# Patient Record
Sex: Male | Born: 1940 | Race: White | Hispanic: No | Marital: Married | Smoking: Former smoker
Health system: Southern US, Community
[De-identification: ages and names within clinical notes are randomized; demographics above are authoritative.]

## PROBLEM LIST (undated history)

## (undated) DIAGNOSIS — I10 Essential (primary) hypertension: Secondary | ICD-10-CM

## (undated) DIAGNOSIS — N289 Disorder of kidney and ureter, unspecified: Secondary | ICD-10-CM

## (undated) DIAGNOSIS — A048 Other specified bacterial intestinal infections: Secondary | ICD-10-CM

## (undated) DIAGNOSIS — E785 Hyperlipidemia, unspecified: Secondary | ICD-10-CM

## (undated) DIAGNOSIS — N400 Enlarged prostate without lower urinary tract symptoms: Secondary | ICD-10-CM

## (undated) DIAGNOSIS — M109 Gout, unspecified: Secondary | ICD-10-CM

## (undated) DIAGNOSIS — E559 Vitamin D deficiency, unspecified: Secondary | ICD-10-CM

## (undated) DIAGNOSIS — L409 Psoriasis, unspecified: Secondary | ICD-10-CM

## (undated) DIAGNOSIS — G4730 Sleep apnea, unspecified: Secondary | ICD-10-CM

## (undated) DIAGNOSIS — M1990 Unspecified osteoarthritis, unspecified site: Secondary | ICD-10-CM

## (undated) DIAGNOSIS — N189 Chronic kidney disease, unspecified: Secondary | ICD-10-CM

## (undated) HISTORY — PX: KNEE ARTHROSCOPY: SUR90

## (undated) HISTORY — PX: REPLACEMENT TOTAL KNEE: SUR1224

## (undated) HISTORY — PX: TRANSURETHRAL RESECTION OF PROSTATE: SHX73

## (undated) HISTORY — PX: JOINT REPLACEMENT: SHX530

## (undated) HISTORY — PX: SHOULDER SURGERY: SHX246

---

## 2004-06-18 ENCOUNTER — Ambulatory Visit: Payer: Self-pay | Admitting: Orthopaedic Surgery

## 2005-12-28 ENCOUNTER — Emergency Department: Payer: Self-pay | Admitting: Emergency Medicine

## 2006-03-03 ENCOUNTER — Ambulatory Visit: Payer: Self-pay | Admitting: Gastroenterology

## 2008-06-29 ENCOUNTER — Ambulatory Visit: Payer: Self-pay | Admitting: Otolaryngology

## 2009-05-24 ENCOUNTER — Ambulatory Visit: Payer: Self-pay | Admitting: Otolaryngology

## 2009-06-07 ENCOUNTER — Ambulatory Visit: Payer: Self-pay | Admitting: Otolaryngology

## 2010-07-23 ENCOUNTER — Ambulatory Visit: Payer: Self-pay | Admitting: Nephrology

## 2010-09-17 ENCOUNTER — Ambulatory Visit: Payer: Self-pay | Admitting: Unknown Physician Specialty

## 2010-09-27 ENCOUNTER — Ambulatory Visit: Payer: Self-pay | Admitting: Anesthesiology

## 2010-10-01 ENCOUNTER — Ambulatory Visit: Payer: Self-pay | Admitting: Unknown Physician Specialty

## 2012-01-14 ENCOUNTER — Ambulatory Visit: Payer: Self-pay | Admitting: Gastroenterology

## 2012-08-04 ENCOUNTER — Emergency Department: Payer: Self-pay | Admitting: Emergency Medicine

## 2012-08-04 LAB — COMPREHENSIVE METABOLIC PANEL
Alkaline Phosphatase: 107 U/L (ref 50–136)
Anion Gap: 7 (ref 7–16)
BUN: 23 mg/dL — ABNORMAL HIGH (ref 7–18)
Bilirubin,Total: 0.5 mg/dL (ref 0.2–1.0)
Co2: 27 mmol/L (ref 21–32)
Creatinine: 1.91 mg/dL — ABNORMAL HIGH (ref 0.60–1.30)
EGFR (African American): 39 — ABNORMAL LOW
EGFR (Non-African Amer.): 34 — ABNORMAL LOW
Glucose: 149 mg/dL — ABNORMAL HIGH (ref 65–99)
Osmolality: 282 (ref 275–301)
Potassium: 3.7 mmol/L (ref 3.5–5.1)
SGPT (ALT): 20 U/L (ref 12–78)
Sodium: 138 mmol/L (ref 136–145)

## 2012-08-04 LAB — URINALYSIS, COMPLETE
Bacteria: NONE SEEN
Hyaline Cast: 1
Ketone: NEGATIVE
Nitrite: NEGATIVE
Ph: 5 (ref 4.5–8.0)
Protein: NEGATIVE
Squamous Epithelial: 1

## 2012-08-04 LAB — CBC
HCT: 44.1 % (ref 40.0–52.0)
HGB: 15.1 g/dL (ref 13.0–18.0)
MCH: 28 pg (ref 26.0–34.0)
MCHC: 34.1 g/dL (ref 32.0–36.0)
Platelet: 301 10*3/uL (ref 150–440)
RBC: 5.39 10*6/uL (ref 4.40–5.90)
WBC: 13.8 10*3/uL — ABNORMAL HIGH (ref 3.8–10.6)

## 2013-05-20 ENCOUNTER — Ambulatory Visit: Payer: Self-pay | Admitting: Unknown Physician Specialty

## 2014-08-10 ENCOUNTER — Ambulatory Visit: Payer: Self-pay | Admitting: Unknown Physician Specialty

## 2014-08-24 ENCOUNTER — Ambulatory Visit: Payer: Self-pay | Admitting: Unknown Physician Specialty

## 2014-08-24 LAB — PROTIME-INR
INR: 0.9
Prothrombin Time: 12.3 secs (ref 11.5–14.7)

## 2014-08-24 LAB — BASIC METABOLIC PANEL
Anion Gap: 7 (ref 7–16)
BUN: 20 mg/dL — ABNORMAL HIGH (ref 7–18)
CHLORIDE: 104 mmol/L (ref 98–107)
CO2: 28 mmol/L (ref 21–32)
Calcium, Total: 9.2 mg/dL (ref 8.5–10.1)
Creatinine: 1.51 mg/dL — ABNORMAL HIGH (ref 0.60–1.30)
GFR CALC AF AMER: 58 — AB
GFR CALC NON AF AMER: 48 — AB
Glucose: 87 mg/dL (ref 65–99)
Osmolality: 280 (ref 275–301)
Potassium: 4.1 mmol/L (ref 3.5–5.1)
Sodium: 139 mmol/L (ref 136–145)

## 2014-08-24 LAB — MRSA PCR SCREENING

## 2014-08-24 LAB — CBC
HCT: 44.3 % (ref 40.0–52.0)
HGB: 14.4 g/dL (ref 13.0–18.0)
MCH: 27.3 pg (ref 26.0–34.0)
MCHC: 32.6 g/dL (ref 32.0–36.0)
MCV: 84 fL (ref 80–100)
PLATELETS: 310 10*3/uL (ref 150–440)
RBC: 5.3 10*6/uL (ref 4.40–5.90)
RDW: 14.1 % (ref 11.5–14.5)
WBC: 10.1 10*3/uL (ref 3.8–10.6)

## 2014-08-24 LAB — APTT: ACTIVATED PTT: 27 s (ref 23.6–35.9)

## 2014-09-07 ENCOUNTER — Inpatient Hospital Stay: Payer: Self-pay | Admitting: Unknown Physician Specialty

## 2014-09-08 LAB — CBC WITH DIFFERENTIAL/PLATELET
Basophil #: 0 10*3/uL (ref 0.0–0.1)
Basophil %: 0.2 %
EOS PCT: 0.1 %
Eosinophil #: 0 10*3/uL (ref 0.0–0.7)
HCT: 36.4 % — ABNORMAL LOW (ref 40.0–52.0)
HGB: 12 g/dL — ABNORMAL LOW (ref 13.0–18.0)
LYMPHS ABS: 1.4 10*3/uL (ref 1.0–3.6)
Lymphocyte %: 8.7 %
MCH: 27.3 pg (ref 26.0–34.0)
MCHC: 32.9 g/dL (ref 32.0–36.0)
MCV: 83 fL (ref 80–100)
Monocyte #: 2 x10 3/mm — ABNORMAL HIGH (ref 0.2–1.0)
Monocyte %: 12.2 %
NEUTROS ABS: 12.8 10*3/uL — AB (ref 1.4–6.5)
Neutrophil %: 78.8 %
Platelet: 263 10*3/uL (ref 150–440)
RBC: 4.39 10*6/uL — AB (ref 4.40–5.90)
RDW: 14.3 % (ref 11.5–14.5)
WBC: 16.3 10*3/uL — AB (ref 3.8–10.6)

## 2014-09-08 LAB — BASIC METABOLIC PANEL
Anion Gap: 6 — ABNORMAL LOW (ref 7–16)
BUN: 19 mg/dL — AB (ref 7–18)
CALCIUM: 8.3 mg/dL — AB (ref 8.5–10.1)
Chloride: 104 mmol/L (ref 98–107)
Co2: 30 mmol/L (ref 21–32)
Creatinine: 1.65 mg/dL — ABNORMAL HIGH (ref 0.60–1.30)
EGFR (African American): 53 — ABNORMAL LOW
EGFR (Non-African Amer.): 43 — ABNORMAL LOW
GLUCOSE: 153 mg/dL — AB (ref 65–99)
Osmolality: 285 (ref 275–301)
Potassium: 4.5 mmol/L (ref 3.5–5.1)
SODIUM: 140 mmol/L (ref 136–145)

## 2014-09-09 LAB — BASIC METABOLIC PANEL
Anion Gap: 6 — ABNORMAL LOW (ref 7–16)
BUN: 17 mg/dL (ref 7–18)
CHLORIDE: 100 mmol/L (ref 98–107)
CREATININE: 1.59 mg/dL — AB (ref 0.60–1.30)
Calcium, Total: 8.2 mg/dL — ABNORMAL LOW (ref 8.5–10.1)
Co2: 28 mmol/L (ref 21–32)
EGFR (African American): 55 — ABNORMAL LOW
GFR CALC NON AF AMER: 45 — AB
GLUCOSE: 163 mg/dL — AB (ref 65–99)
Osmolality: 273 (ref 275–301)
POTASSIUM: 4.3 mmol/L (ref 3.5–5.1)
SODIUM: 134 mmol/L — AB (ref 136–145)

## 2014-09-09 LAB — CBC WITH DIFFERENTIAL/PLATELET
BASOS PCT: 0.4 %
Basophil #: 0.1 10*3/uL (ref 0.0–0.1)
EOS PCT: 0.7 %
Eosinophil #: 0.1 10*3/uL (ref 0.0–0.7)
HCT: 32.9 % — AB (ref 40.0–52.0)
HGB: 10.7 g/dL — AB (ref 13.0–18.0)
LYMPHS PCT: 7.3 %
Lymphocyte #: 1.3 10*3/uL (ref 1.0–3.6)
MCH: 27.2 pg (ref 26.0–34.0)
MCHC: 32.6 g/dL (ref 32.0–36.0)
MCV: 84 fL (ref 80–100)
Monocyte #: 2 x10 3/mm — ABNORMAL HIGH (ref 0.2–1.0)
Monocyte %: 11.7 %
Neutrophil #: 13.8 10*3/uL — ABNORMAL HIGH (ref 1.4–6.5)
Neutrophil %: 79.9 %
Platelet: 226 10*3/uL (ref 150–440)
RBC: 3.93 10*6/uL — ABNORMAL LOW (ref 4.40–5.90)
RDW: 14.2 % (ref 11.5–14.5)
WBC: 17.3 10*3/uL — AB (ref 3.8–10.6)

## 2014-09-11 LAB — CBC WITH DIFFERENTIAL/PLATELET
BASOS ABS: 0 10*3/uL (ref 0.0–0.1)
BASOS PCT: 0.3 %
EOS ABS: 0.4 10*3/uL (ref 0.0–0.7)
Eosinophil %: 3.7 %
HCT: 29.1 % — ABNORMAL LOW (ref 40.0–52.0)
HGB: 9.6 g/dL — AB (ref 13.0–18.0)
Lymphocyte #: 1.1 10*3/uL (ref 1.0–3.6)
Lymphocyte %: 9.6 %
MCH: 27.4 pg (ref 26.0–34.0)
MCHC: 32.9 g/dL (ref 32.0–36.0)
MCV: 83 fL (ref 80–100)
MONO ABS: 1.1 x10 3/mm — AB (ref 0.2–1.0)
Monocyte %: 10.1 %
NEUTROS ABS: 8.4 10*3/uL — AB (ref 1.4–6.5)
NEUTROS PCT: 76.3 %
Platelet: 260 10*3/uL (ref 150–440)
RBC: 3.49 10*6/uL — ABNORMAL LOW (ref 4.40–5.90)
RDW: 14 % (ref 11.5–14.5)
WBC: 11.1 10*3/uL — ABNORMAL HIGH (ref 3.8–10.6)

## 2014-09-12 ENCOUNTER — Ambulatory Visit: Payer: Self-pay

## 2014-12-07 ENCOUNTER — Ambulatory Visit
Admit: 2014-12-07 | Disposition: A | Discharge status: Home / Self Care | Payer: Self-pay | Attending: Unknown Physician Specialty | Admitting: Unknown Physician Specialty

## 2015-01-04 NOTE — Op Note (Signed)
PATIENT NAME:  Corey Johnson, Corey Johnson MR#:  [id number] DATE OF BIRTH:  1940-11-01  DATE OF PROCEDURE:  09/07/2014  PREOPERATIVE DIAGNOSIS: Severe degenerative arthritis, left knee.   POSTOPERATIVE DIAGNOSIS: Severe degenerative arthritis, left knee.  OPERATION: [employer] total knee replacement.   SURGEON: Jones Bales., MD   ANESTHESIA: Spinal.   HISTORY: The patient had a long history of bilateral knee degenerative arthritis. His left knee had been more symptomatic. He had had MRIs that confirmed that he had severe tricompartmental disease. The patient was ultimately brought in for total knee replacement due to his persistent worsening symptoms.   DESCRIPTION OF PROCEDURE: The patient was taken the Operating Room where satisfactory spinal anesthesia was achieved. A Foley catheter was inserted. A tourniquet was applied to the left thigh, and the left lower extremity was prepped and draped in the usual fashion for a procedure about the knee. The patient was given IV Kefzol prior to the start of the procedure. The left lower extremity was exsanguinated and the tourniquet was inflated. I went ahead and made a longitudinal incision over the anterior aspect of the knee. Incision was carried down to the extensor mechanism. The extensor mechanism  was divided  proximally along the medial third of the quadriceps mechanism, and then distally along the medial border of the patellar tendon.   The patella was everted and dislocated laterally. I did go ahead and resect about 8 mm of bone from the retropatellar surface. I removed osteophytes from the peripheral femur and intercondylar notch. The patient had severe degenerative arthritis involving all 3 compartments.   The meniscal remnants were excised. A hole was made in the trochlear groove near the femoral origin of the PCL. An intramedullary rod was inserted. On the distal end of the rod was an alignment jig and the distal femoral cutting block. After  the distal femoral cutting block was positioned, it was fixed to the distal femur with pins and then 10 mm of bone was resected from the distal femur. We then impacted a sizer onto the distal femur. It was oriented in 3 degrees of external rotation. Using this sizeer it was determined that a #6 femoral component was going to be appropriate for the femur. We then impacted the #6 femoral cutting block onto the distal femur and made our anterior and posterior cuts along with our anterior and posterior chamfer cuts. Using the appropriate jig, I notched out the intercondylar area. The cruciates were excised. The knee was hyperflexed. I went ahead and used an external alignment jig to position the proximal tibial cutting block. We set the cutting block for a cut 9 mm below the lateral tibial plateau. The block was fixed to the proximal tibia and then the alignment jig was removed. The proximal tibial cut was made without difficulty.   Next, we inserted the trial components. It was difficult getting the tibial component in, so we went ahead and removed 2 mm more of bone from the proximal tibia. After this cut, I was able to impact the #6 femoral trial along the distal femur and used a #5 tibial baseplate with a 9 mm spacer. The knee seemed to come to full extension. Holes were made in the distal femur for the pegs on the femoral component.   I then made holes in the retropatellar surface for the retropatellar component. The trials were removed. The knee was hyperflexed. Jet lavage was used to remove blood from the cut cancellous bone. I then sequentially  impacted the components. Prior to impaction, I did make a cruciate cut in the proximal tibia.   I then sequentially impacted the components: The #5 tibial baseplate was impacted onto the proximal tibia with methylmethacrylate. The #6 PS femoral component was impacted onto the distal femur with methylmethacrylate. Excess glue was removed from the components. The trial  #5, 9 mm thick, tibial bearing insert was placed on the tibial tray and the knee was extended, and then I cemented the 35 mm diameter x 10 mm thick asymmetric patella to the retropatellar surface.   At this point the patella tracked well. The knee came to virtual full extension and seemed to be stable. I then removed the trial tibial bearing insert. The permanent #5, 9 mm thick tibial bearing insert was impacted onto the tibial tray, and then the knee was extended.   After glue had set up, the tourniquet was released. It was up for 123 minutes. Bleeding was controlled with coagulation cautery. The wound was irrigated. I then injected 60 mL of a mixture of Exparel and saline into the wound, and then injected about 30 mL of 0.25% Marcaine with epinephrine into the wound. The capsule was closed with 0 Ethibond sutures and the subcutaneous with 2-0 Vicryl and the skin with skin staples.   Betadine was applied to the wounds. Four TENS pads were applied about the knee. Sterile dressing was applied, and a Polar Care was applied over this.   The patient was then transferred to a stretcher bed and taken to the recovery room in satisfactory condition.   Blood loss was about maybe 100 mL.    ____________________________ Kathrene Alu., MD hbk:MT D: 09/07/2014 11:38:36 ET T: 09/07/2014 14:53:41 ET JOB#: [id number]  cc: Kathrene Alu., MD, <Dictator> Vilinda Flake, Brooke Bonito MD ELECTRONICALLY SIGNED 09/28/2014 19:05

## 2015-01-08 NOTE — Discharge Summary (Signed)
PATIENT NAME:  Corey Johnson, Corey MR#:  Johnson DATE OF BIRTH:  09-Apr-1941  DATE OF ADMISSION:  09/07/2014 DATE OF DISCHARGE:  09/11/2014  ADMITTING DIAGNOSIS: Degenerative arthritis, left knee.   DISCHARGE DIAGNOSIS: Degenerative arthritis, left knee.   SURGERY: On 09/07/2014, left total knee arthroplasty.   SURGEON: Kathrene Alu., MD  ANESTHESIA: Spinal.   ESTIMATED BLOOD LOSS: 100 mL.  TOURNIQUET TIME: 143 minutes.   DRAINS: None.   IMPLANTS: Synthes #6 femur, #5 tibial base plate, 9 mm spacer and 35 mm patella, asymmetric.   HISTORY: Corey Johnson is a 74 year old male who failed conservative treatment options and treatment for his left knee degenerative arthritis. He elected to proceed with total knee arthroplasty.   HOSPITAL COURSE: After initial admission on 09/07/2014, the patient underwent surgery. He had good pain control afterwards and was transported to the orthopedic floor. On postoperative day one, 09/08/2014, hemoglobin 12.0, hematocrit 36.4. T-max 99.0. Moderate pain. Physical therapy was begun on that day. On postoperative day two, 09/09/2014, T-max 100.7, Tylenol given, temperature improved to 98.7. Hemoglobin 10.7, sodium 134. Had moderate pain, added  Nucynta and tramadol and Xanax for anxiety. Daughter at bedside. He continued with physical therapy, with slow progress. He was constipated. On postoperative day three, 09/10/2014, T-max 99.7, progressing with physical therapy. On postoperative day four, 09/11/2014, white blood cells 11.1, hemoglobin 9.6, hematocrit 29.1. Had good progress with physical therapy and is stable and ready for discharge.   CONDITION AT DISCHARGE: Stable.   DISPOSITION: The patient was sent home with home health physical therapy.   DISCHARGE INSTRUCTIONS: The patient will follow up in [facility] in 2 weeks for staple removal. Weightbearing as tolerated on the left lower extremity. Thigh-high TED hose bilaterally. Regular  diet.   DISCHARGE MEDICATIONS: Please see discharge instructions for a complete list of discharge medications.   ____________________________ Shayaan Parke M. Tretha Sciara, NP amb:je D: 09/14/2014 14:21:08 ET T: 09/14/2014 14:33:52 ET JOB#: [id number]  cc: Penne Rosenstock M. Tretha Sciara, NP, <Dictator> Kem Kays Paighton Godette FNP ELECTRONICALLY SIGNED 09/20/2014 11:16

## 2015-02-05 ENCOUNTER — Inpatient Hospital Stay
Admission: EM | Admit: 2015-02-05 | Discharge: 2015-02-08 | DRG: 419 | Disposition: A | Discharge status: Home / Self Care | Payer: Commercial Managed Care - HMO | Attending: Surgery | Admitting: Surgery

## 2015-02-05 ENCOUNTER — Emergency Department: Payer: Commercial Managed Care - HMO

## 2015-02-05 ENCOUNTER — Encounter: Payer: Self-pay | Admitting: Emergency Medicine

## 2015-02-05 DIAGNOSIS — I129 Hypertensive chronic kidney disease with stage 1 through stage 4 chronic kidney disease, or unspecified chronic kidney disease: Secondary | ICD-10-CM | POA: Diagnosis present

## 2015-02-05 DIAGNOSIS — K8000 Calculus of gallbladder with acute cholecystitis without obstruction: Principal | ICD-10-CM | POA: Diagnosis present

## 2015-02-05 DIAGNOSIS — R1010 Upper abdominal pain, unspecified: Secondary | ICD-10-CM | POA: Diagnosis present

## 2015-02-05 DIAGNOSIS — G4730 Sleep apnea, unspecified: Secondary | ICD-10-CM | POA: Diagnosis present

## 2015-02-05 DIAGNOSIS — Z419 Encounter for procedure for purposes other than remedying health state, unspecified: Secondary | ICD-10-CM

## 2015-02-05 DIAGNOSIS — R109 Unspecified abdominal pain: Secondary | ICD-10-CM

## 2015-02-05 DIAGNOSIS — K819 Cholecystitis, unspecified: Secondary | ICD-10-CM

## 2015-02-05 DIAGNOSIS — N189 Chronic kidney disease, unspecified: Secondary | ICD-10-CM | POA: Diagnosis present

## 2015-02-05 DIAGNOSIS — F17200 Nicotine dependence, unspecified, uncomplicated: Secondary | ICD-10-CM | POA: Diagnosis present

## 2015-02-05 DIAGNOSIS — A419 Sepsis, unspecified organism: Secondary | ICD-10-CM

## 2015-02-05 DIAGNOSIS — Z79899 Other long term (current) drug therapy: Secondary | ICD-10-CM | POA: Diagnosis not present

## 2015-02-05 HISTORY — DX: Sleep apnea, unspecified: G47.30

## 2015-02-05 HISTORY — DX: Unspecified osteoarthritis, unspecified site: M19.90

## 2015-02-05 HISTORY — DX: Essential (primary) hypertension: I10

## 2015-02-05 HISTORY — DX: Disorder of kidney and ureter, unspecified: N28.9

## 2015-02-05 LAB — CBC WITH DIFFERENTIAL/PLATELET
Basophils Absolute: 0.1 10*3/uL (ref 0–0.1)
Eosinophils Absolute: 0.2 10*3/uL (ref 0–0.7)
Eosinophils Relative: 1 %
HCT: 36.8 % — ABNORMAL LOW (ref 40.0–52.0)
Hemoglobin: 12 g/dL — ABNORMAL LOW (ref 13.0–18.0)
Lymphs Abs: 1.4 10*3/uL (ref 1.0–3.6)
MCH: 25.6 pg — ABNORMAL LOW (ref 26.0–34.0)
MCHC: 32.6 g/dL (ref 32.0–36.0)
MCV: 78.7 fL — AB (ref 80.0–100.0)
Monocytes Absolute: 2.6 10*3/uL — ABNORMAL HIGH (ref 0.2–1.0)
Monocytes Relative: 7 %
Neutro Abs: 31.2 10*3/uL — ABNORMAL HIGH (ref 1.4–6.5)
Neutrophils Relative %: 88 % — ABNORMAL HIGH (ref 43–77)
PLATELETS: 304 10*3/uL (ref 150–440)
RBC: 4.68 MIL/uL (ref 4.40–5.90)
RDW: 14.6 % — ABNORMAL HIGH (ref 11.5–14.5)
WBC: 35.6 10*3/uL — AB (ref 3.8–10.6)

## 2015-02-05 LAB — COMPREHENSIVE METABOLIC PANEL
ALT: 11 U/L — ABNORMAL LOW (ref 17–63)
AST: 16 U/L (ref 15–41)
Albumin: 3.2 g/dL — ABNORMAL LOW (ref 3.5–5.0)
Alkaline Phosphatase: 65 U/L (ref 38–126)
Anion gap: 11 (ref 5–15)
BUN: 31 mg/dL — AB (ref 6–20)
CALCIUM: 8.7 mg/dL — AB (ref 8.9–10.3)
CHLORIDE: 102 mmol/L (ref 101–111)
CO2: 25 mmol/L (ref 22–32)
Creatinine, Ser: 2.13 mg/dL — ABNORMAL HIGH (ref 0.61–1.24)
GFR calc non Af Amer: 28 mL/min — ABNORMAL LOW (ref >60)
GFR, EST AFRICAN AMERICAN: 33 mL/min — AB (ref >60)
Glucose, Bld: 156 mg/dL — ABNORMAL HIGH (ref 65–99)
Potassium: 3.5 mmol/L (ref 3.5–5.1)
Sodium: 138 mmol/L (ref 135–145)
Total Bilirubin: 0.9 mg/dL (ref 0.3–1.2)
Total Protein: 7.6 g/dL (ref 6.5–8.1)

## 2015-02-05 LAB — LIPASE, BLOOD: LIPASE: 30 U/L (ref 22–51)

## 2015-02-05 LAB — TROPONIN I

## 2015-02-05 MED ORDER — ROPINIROLE HCL ER 2 MG PO TB24: 2.0000 mg | ORAL_TABLET | Freq: Every day | ORAL | Status: DC

## 2015-02-05 MED ORDER — SODIUM CHLORIDE 0.9 % IV SOLN
1.5000 g | Freq: Four times a day (QID) | INTRAVENOUS | Status: DC
  Administered 2015-02-06 – 2015-02-08 (×9): 1.5 g via INTRAVENOUS
  Filled 2015-02-05 (×10): qty 1.5

## 2015-02-05 MED ORDER — TERAZOSIN HCL 5 MG PO CAPS
5.0000 mg | ORAL_CAPSULE | Freq: Every day | ORAL | Status: DC
  Administered 2015-02-06 – 2015-02-07 (×3): 5 mg via ORAL
  Filled 2015-02-05 (×4): qty 1

## 2015-02-05 MED ORDER — FEBUXOSTAT 40 MG PO TABS
40.0000 mg | ORAL_TABLET | Freq: Every day | ORAL | Status: DC
  Administered 2015-02-07: 40 mg via ORAL
  Filled 2015-02-05 (×3): qty 1

## 2015-02-05 MED ORDER — ENALAPRIL MALEATE 10 MG PO TABS
20.0000 mg | ORAL_TABLET | Freq: Every day | ORAL | Status: DC
  Administered 2015-02-06 – 2015-02-08 (×3): 20 mg via ORAL
  Filled 2015-02-05 (×3): qty 2

## 2015-02-05 MED ORDER — IOHEXOL 240 MG/ML SOLN
25.0000 mL | INTRAMUSCULAR | Status: AC
  Administered 2015-02-05: 25 mL via ORAL

## 2015-02-05 MED ORDER — PIPERACILLIN-TAZOBACTAM 3.375 G IVPB
3.3750 g | Freq: Once | INTRAVENOUS | Status: AC
  Administered 2015-02-05: 3.375 g via INTRAVENOUS

## 2015-02-05 MED ORDER — VANCOMYCIN HCL 10 G IV SOLR: 1000.0000 mg | Freq: Once | INTRAVENOUS | Status: DC

## 2015-02-05 MED ORDER — SODIUM CHLORIDE 0.9 % IV SOLN
INTRAVENOUS | Status: AC
  Administered 2015-02-06: 01:00:00
  Filled 2015-02-05: qty 3

## 2015-02-05 MED ORDER — SODIUM CHLORIDE 0.9 % IV BOLUS (SEPSIS)
1000.0000 mL | Freq: Once | INTRAVENOUS | Status: AC
  Administered 2015-02-05: 1000 mL via INTRAVENOUS

## 2015-02-05 MED ORDER — ONDANSETRON HCL 4 MG/2ML IJ SOLN
4.0000 mg | Freq: Once | INTRAMUSCULAR | Status: AC
  Administered 2015-02-05: 4 mg via INTRAVENOUS

## 2015-02-05 MED ORDER — SODIUM CHLORIDE 0.9 % IV BOLUS (SEPSIS)
1000.0000 mL | Freq: Once | INTRAVENOUS | Status: AC
Start: 2015-02-05 — End: 2015-02-05
  Administered 2015-02-05: 1000 mL via INTRAVENOUS

## 2015-02-05 MED ORDER — COLCHICINE 0.6 MG PO TABS
0.6000 mg | ORAL_TABLET | Freq: Every day | ORAL | Status: DC
  Administered 2015-02-06 – 2015-02-08 (×3): 0.6 mg via ORAL
  Filled 2015-02-05 (×3): qty 1

## 2015-02-05 MED ORDER — PIPERACILLIN-TAZOBACTAM 3.375 G IVPB
INTRAVENOUS | Status: AC
  Administered 2015-02-05: 3.375 g via INTRAVENOUS
  Filled 2015-02-05: qty 50

## 2015-02-05 MED ORDER — ONDANSETRON HCL 4 MG/2ML IJ SOLN
INTRAMUSCULAR | Status: AC
  Administered 2015-02-05: 4 mg via INTRAVENOUS
  Filled 2015-02-05: qty 2

## 2015-02-05 MED ORDER — MORPHINE SULFATE 2 MG/ML IJ SOLN
2.0000 mg | INTRAMUSCULAR | Status: DC | PRN
  Administered 2015-02-06: 2 mg via INTRAVENOUS
  Filled 2015-02-05: qty 1

## 2015-02-05 MED ORDER — KCL IN DEXTROSE-NACL 20-5-0.45 MEQ/L-%-% IV SOLN
INTRAVENOUS | Status: DC
  Administered 2015-02-06: 01:00:00 via INTRAVENOUS
  Administered 2015-02-06: 1000 mL via INTRAVENOUS
  Administered 2015-02-07: 04:00:00 via INTRAVENOUS
  Administered 2015-02-07: 1000 mL via INTRAVENOUS
  Administered 2015-02-08: 04:00:00 via INTRAVENOUS
  Filled 2015-02-05 (×7): qty 1000

## 2015-02-05 MED ORDER — PROMETHAZINE HCL 25 MG PO TABS: 25.0000 mg | ORAL_TABLET | Freq: Four times a day (QID) | ORAL | Status: DC | PRN

## 2015-02-05 MED ORDER — ACETAMINOPHEN 650 MG RE SUPP: 650.0000 mg | Freq: Four times a day (QID) | RECTAL | Status: DC | PRN

## 2015-02-05 MED ORDER — PANTOPRAZOLE SODIUM 40 MG PO TBEC
40.0000 mg | DELAYED_RELEASE_TABLET | Freq: Every day | ORAL | Status: DC
  Administered 2015-02-06 – 2015-02-08 (×3): 40 mg via ORAL
  Filled 2015-02-05 (×3): qty 1

## 2015-02-05 MED ORDER — DILTIAZEM HCL ER COATED BEADS 180 MG PO CP24
180.0000 mg | ORAL_CAPSULE | Freq: Every day | ORAL | Status: DC
  Administered 2015-02-06 – 2015-02-08 (×3): 180 mg via ORAL
  Filled 2015-02-05 (×3): qty 1

## 2015-02-05 MED ORDER — ENOXAPARIN SODIUM 30 MG/0.3ML ~~LOC~~ SOLN
30.0000 mg | SUBCUTANEOUS | Status: DC
  Administered 2015-02-06: 30 mg via SUBCUTANEOUS
  Filled 2015-02-05 (×2): qty 0.3

## 2015-02-05 MED ORDER — ACETAMINOPHEN 325 MG PO TABS: 650.0000 mg | ORAL_TABLET | Freq: Four times a day (QID) | ORAL | Status: DC | PRN

## 2015-02-05 MED ORDER — SODIUM CHLORIDE 0.9 % IV BOLUS (SEPSIS)
500.0000 mL | Freq: Once | INTRAVENOUS | Status: AC
  Administered 2015-02-05: 500 mL via INTRAVENOUS

## 2015-02-05 MED ORDER — PRAVASTATIN SODIUM 10 MG PO TABS
10.0000 mg | ORAL_TABLET | Freq: Every day | ORAL | Status: DC
  Administered 2015-02-07 – 2015-02-08 (×2): 10 mg via ORAL
  Filled 2015-02-05 (×3): qty 1

## 2015-02-05 MED ORDER — VANCOMYCIN HCL IN DEXTROSE 1-5 GM/200ML-% IV SOLN
INTRAVENOUS | Status: AC
  Administered 2015-02-05: 1000 mg via INTRAVENOUS
  Filled 2015-02-05: qty 200

## 2015-02-05 MED ORDER — VANCOMYCIN HCL IN DEXTROSE 1-5 GM/200ML-% IV SOLN
1000.0000 mg | Freq: Once | INTRAVENOUS | Status: AC
Start: 2015-02-05 — End: 2015-02-05
  Administered 2015-02-05: 1000 mg via INTRAVENOUS

## 2015-02-05 MED ORDER — DIPHENHYDRAMINE HCL 12.5 MG/5ML PO ELIX: 12.5000 mg | ORAL_SOLUTION | Freq: Four times a day (QID) | ORAL | Status: DC | PRN

## 2015-02-05 MED ORDER — HYDROCHLOROTHIAZIDE 25 MG PO TABS
25.0000 mg | ORAL_TABLET | Freq: Every day | ORAL | Status: DC
  Administered 2015-02-06 – 2015-02-08 (×3): 25 mg via ORAL
  Filled 2015-02-05 (×3): qty 1

## 2015-02-05 MED ORDER — DIPHENHYDRAMINE HCL 50 MG/ML IJ SOLN: 12.5000 mg | Freq: Four times a day (QID) | INTRAMUSCULAR | Status: DC | PRN

## 2015-02-05 MED ORDER — PROMETHAZINE HCL 25 MG/ML IJ SOLN: 25.0000 mg | Freq: Four times a day (QID) | INTRAMUSCULAR | Status: DC | PRN

## 2015-02-05 NOTE — ED Notes (Signed)
Pt presents to ER c/o right upper quad pain. Pt is diaphoretic, hypotensive. Hx of fever at home.

## 2015-02-05 NOTE — ED Notes (Signed)
Lactic acid obtained per order and sent. Pt still unable to give urine sample.

## 2015-02-05 NOTE — ED Provider Notes (Addendum)
CT scan reviewed by me, with gallbladder inflammation and gallstones. Dr. Glenice Bow has been consulted for evaluation.  Earleen Newport, MD 02/05/15 2203  FINDINGS: The lung bases are free of acute infiltrate or sizable effusion. Minimal basilar atelectasis is noted. A few tiny subpleural nodules are noted. These measure less than 2 mm.  The liver, spleen, adrenal glands and pancreas are within normal limits. The gallbladder is well distended and demonstrates multiple gallstones with changes suggestive of gallbladder wall thickening and mild. Cholecystic inflammatory change.  The kidneys are well visualized and demonstrates some cystic change consistent with the given clinical history of end-stage renal disease. No obstructive changes are noted bilaterally. The bladder is partially distended. The prostate is within normal limits. No pelvic mass lesion is noted. The appendix is within normal limits. Bony structures show degenerative change without acute abnormality.  IMPRESSION: Cholelithiasis with gallbladder wall thickening and pericholecystic inflammatory change suspicious for acute cholecystitis.  Cystic change in the kidneys consistent with the patient's given clinical history of renal disease.  Scattered subpleural nodules measuring less than 5 mm. If the patient is at high risk for bronchogenic carcinoma, follow-up chest CT at 1 year is recommended. If the patient is at low risk, no follow-up is needed. This recommendation follows the consensus statement: Guidelines for Management of Small Pulmonary Nodules Detected on CT Scans: A Statement from the [place] as published in Radiology 2005; 237:395-400.   Earleen Newport, MD 02/05/15 2211

## 2015-02-05 NOTE — H&P (Signed)
Patient ID: Corey Johnson, male   DOB: [birth year], 74 y.o.   MRN: [id number]  History of Present Illness Corey Johnson is a 74 y.o. male with a 2 day history of severe upper abdominal pain that kept him from sleeping Friday night. He was able to sleep Saturday night with the aid of a heating pad as the pain migrated to his right upper quadrant. He has had a low-grade fever, nausea, but no vomiting.  Past Medical History Past Medical History  Diagnosis Date  . Arthritis   . Hypertension   . Renal disorder      Chronic renal insufficiency  Past Surgical History  Procedure Laterality Date  . Joint replacement      No Known Allergies  Current Facility-Administered Medications  Medication Dose Route Frequency Provider Last Rate Last Dose  . iohexol (OMNIPAQUE) 240 MG/ML injection 25 mL  25 mL Oral Q1 Hr x 2 Medication Radiologist, MD   25 mL at 02/05/15 2045  . piperacillin-tazobactam (ZOSYN) IVPB 3.375 g  3.375 g Intravenous Once Harvest Dark, MD 12.5 mL/hr at 02/05/15 2024 3.375 g at 02/05/15 2024  . sodium chloride 0.9 % bolus 500 mL  500 mL Intravenous Once Harvest Dark, MD       Current Outpatient Prescriptions  Medication Sig Dispense Refill  . colchicine 0.6 MG tablet Take 0.6 mg by mouth daily.    Marland Kitchen diltiazem (CARDIZEM CD) 180 MG 24 hr capsule Take 180 mg by mouth daily.    . enalapril (VASOTEC) 20 MG tablet Take 20 mg by mouth daily.    . febuxostat (ULORIC) 40 MG tablet Take 40 mg by mouth daily.    . hydrochlorothiazide (HYDRODIURIL) 25 MG tablet Take 25 mg by mouth daily.    Marland Kitchen omeprazole (PRILOSEC) 40 MG capsule Take 40 mg by mouth daily.    . pravastatin (PRAVACHOL) 10 MG tablet Take 10 mg by mouth daily.    Marland Kitchen rOPINIRole (REQUIP XL) 2 MG 24 hr tablet Take 2 mg by mouth at bedtime.    Marland Kitchen terazosin (HYTRIN) 5 MG capsule Take 5 mg by mouth at bedtime.      Family History History reviewed. No pertinent family history.   Mother died of a ruptured  cerebral aneurysm, father died of pneumonia.  Social History History  Substance Use Topics  . Smoking status: Not on file  . Smokeless tobacco: Not on file  . Alcohol Use: Not on file     Married, lives with his wife. Retired from [employer].  Review of Systems A 10 point review of systems was asked and was negative except for the following positive findings: Upper abdominal pain, nausea, low-grade fever. He specifically denies changes in the color of his skin, eyes, urine, or stool. He claims that his urine output is at baseline.  Physical Exam Blood pressure 124/68, pulse 73, temperature 97.7 F (36.5 C), temperature source Oral, resp. rate 21, height 5\' 9"  (1.753 m), weight 111.131 kg (245 lb), SpO2 92 %.  CONSTITUTIONAL: Pleasant elderly male who has some hearing difficulty. EYES: Pupils equal, round, and reactive to light, Sclera non-icteric. EARS, NOSE, MOUTH AND THROAT: The oropharynx is clear. Oral mucosa is pink and moist. Hearing is intact to voice.  NECK: Trachea is midline, and there is no jugular venous distension. Thyroid is without palpable abnormalities. LYMPH NODES:  Lymph nodes in the neck are not enlarged. RESPIRATORY:  Lungs are clear, and breath sounds are equal bilaterally. Normal respiratory effort without  pathologic use of accessory muscles. CARDIOVASCULAR: Heart is regular without murmurs, gallops, or rubs. GI: The abdomen is obese and protuberant. It is tender in the right upper quadrant, and nondistended. There were no palpable masses. There was no hepatosplenomegaly. There were normal bowel sounds. GU: Deferred. MUSCULOSKELETAL:  Normal muscle strength and tone in all four extremities.    SKIN: Skin turgor is normal. There are no pathologic skin lesions.  NEUROLOGIC:  Motor and sensation is grossly normal.  Cranial nerves are grossly intact. PSYCH:  Alert and oriented to person, place and time. Affect is normal.  Data Reviewed CT scan without  contrast, labs. I have personally reviewed the patient's imaging and medical records.    Assessment    Acute calculus cholecystitis.  Plan    Admit for IV fluid hydration, IV antibiotics, and possible laparoscopic cholecystectomy tomorrow by my partner. The patient understands that the decision to operate as well as the timing will be up to him.  Face-to-face time spent with the patient and care providers was 45 minutes, with more than 50% of the time spent counseling, educating, and coordinating care of the patient.     Consuela Mimes 02/05/2015, 10:49 PM

## 2015-02-05 NOTE — ED Notes (Signed)
Dr spoke with pt and family regarding pt's infected gallbladder. Surgeon will look at ct and pt will be admitted

## 2015-02-05 NOTE — ED Provider Notes (Addendum)
[facility] Emergency Department Provider Note  Time seen: 8:33 PM  I have reviewed the triage vital signs and the nursing notes.   HISTORY  Chief Complaint Abdominal Pain    HPI Corey Johnson is a 74 y.o. male presents the emergency department with 3 days of right upper quadrant abdominal pain. For last 3 days patient has a dull/aching upper abdominal pain mostly on the right side. He has also noticed his abdomen has become distended, he has not had a bowel movement in 3 days. Today he developed a fever with increased pain so they came to the emergency department for evaluation. Does state nausea, denies any vomiting, diarrhea, dysuria, black/bloody stool. Patient has no history of bowel obstruction in the past. Upon arrival patient hypotensive 86/52, diaphoretic due to pain. Patient states currently the pain is moderate, dull/aching, somewhat worse with movement.    No past medical history on file.  There are no active problems to display for this patient.   No past surgical history on file.  No current outpatient prescriptions on file.  Allergies Review of patient's allergies indicates no known allergies.  No family history on file.  Social History History  Substance Use Topics  . Smoking status: Not on file  . Smokeless tobacco: Not on file  . Alcohol Use: Not on file    Review of Systems Constitutional: Positive for fever. Cardiovascular: Negative for chest pain. Respiratory: Negative for shortness of breath. Gastrointestinal: Positive for upper abdominal pain, nausea. Negative for vomiting/diarrhea. +3 days of constipation. Musculoskeletal: Negative for back pain. Neurological: Negative for headaches, focal weakness or numbness.  10-point ROS otherwise negative.  ____________________________________________   PHYSICAL EXAM:  VITAL SIGNS: ED Triage Vitals  Enc Vitals Group     BP 02/05/15 1913 86/52 mmHg     Pulse Rate  02/05/15 1913 115     Resp 02/05/15 1913 24     Temp 02/05/15 1913 98.3 F (36.8 C)     Temp Source 02/05/15 1913 Oral     SpO2 02/05/15 1913 92 %     Weight 02/05/15 1913 245 lb (111.131 kg)     Height 02/05/15 1913 5\' 9"  (1.753 m)     Head Cir --      Peak Flow --      Pain Score 02/05/15 1913 10     Pain Loc --      Pain Edu? --      Excl. in [place]? --     Constitutional: Alert and oriented. Well appearing and in no distress. ENT   Mouth/Throat: Mucous membranes are moist. Cardiovascular: Normal rate, regular rhythm around 100 bpm. Respiratory: Normal respiratory effort without tachypnea nor retractions. Breath sounds are clear  Gastrointestinal: Moderately distended, moderate right upper quadrant tenderness to palpation, mild diffuse tenderness to palpation, no rebound or guarding. No signs of peritonitis. Musculoskeletal: Nontender with normal range of motion in all extremities.  Neurologic:  Normal speech and language. No gross focal neurologic deficits  Skin:  Skin is warm, dry and intact.  Psychiatric: Mood and affect are normal. Speech and behavior are normal.  ____________________________________________    EKG  EKG reviewed and interpreted by myself shows sinus tachycardia 106 bpm, narrow QRS, normal axis, normal intervals, no concerning ST changes noted.  ____________________________________________    RADIOLOGY  CT appears to show an inflamed gallbladder with 2 stones. Awaiting official radiology read.  ____________________________________________   INITIAL IMPRESSION / ASSESSMENT AND PLAN / ED COURSE  Pertinent labs & imaging results that were available during my care of the patient were reviewed by me and considered in my medical decision making (see chart for details).  Patient with upper abdominal pain 3 days, along with 3 days of constipation. The patient does note he has been able to pass flatus today. Patient with moderate distention of the abdomen,  with dull percussion. Concerning for bowel obstruction versus ileus versus free air. We'll obtain an upright abdominal x-ray to evaluate as well as a CT abdomen with oral contrast. I have empirically ordered antibiotically, and 30 mL/kilogram bolus of fluids given likely sepsis with hypotension. Labs shows significantly elevated white blood cell count 35,000 concerning for intra-abdominal pathology. Patient's blood pressure has remained stable following fluid boluses, patient remains well appearing. CT and x-ray are still pending.  ----------------------------------------- 9:56 PM on 02/05/2015 -----------------------------------------  On my personal review of the CT abdomen/pelvis it appears most consistent with cholecystitis. I have Dr. Dr. Elliot Cousin, who is currently in the operating room but will review the CT scan soon as possible. Patient continues to appear well, blood pressure has improved with IV fluids, remained afebrile, nontoxic-appearing currently.   CRITICAL CARE Performed by: Harvest Dark   Total critical care time: 30 minutes  Critical care time was exclusive of separately billable procedures and treating other patients.  Critical care was necessary to treat or prevent imminent or life-threatening deterioration.  Critical care was time spent personally by me on the following activities: development of treatment plan with patient and/or surrogate as well as nursing, discussions with consultants, evaluation of patient's response to treatment, examination of patient, obtaining history from patient or surrogate, ordering and performing treatments and interventions, ordering and review of laboratory studies, ordering and review of radiographic studies, pulse oximetry and re-evaluation of patient's condition.    ____________________________________________   FINAL CLINICAL IMPRESSION(S) / ED DIAGNOSES  Cholecystitis Sepsis   Harvest Dark, MD 02/05/15  2157  Harvest Dark, MD 02/05/15 2159

## 2015-02-05 NOTE — ED Notes (Signed)
Pt sleeping. Pt with pox of 88% on ra while sleeping. Pt placed on oxygen at 2lpm via [place]. Pt with rebound pox of 94% after being placed on oxygen. Family updated on admission process. Pt awakes, requests pain medication. Pt's family states "he can't take morphine because he hallucinates with it."

## 2015-02-06 ENCOUNTER — Inpatient Hospital Stay: Payer: Commercial Managed Care - HMO | Admitting: Anesthesiology

## 2015-02-06 ENCOUNTER — Encounter: Payer: Self-pay | Admitting: *Deleted

## 2015-02-06 ENCOUNTER — Encounter
Admission: EM | Disposition: A | Discharge status: Home / Self Care | Payer: Self-pay | Source: Home / Self Care | Attending: Surgery

## 2015-02-06 HISTORY — PX: CHOLECYSTECTOMY: SHX55

## 2015-02-06 LAB — GLUCOSE, CAPILLARY
Glucose-Capillary: 157 mg/dL — ABNORMAL HIGH (ref 65–99)
Glucose-Capillary: 252 mg/dL — ABNORMAL HIGH (ref 65–99)

## 2015-02-06 LAB — URINALYSIS COMPLETE WITH MICROSCOPIC (ARMC ONLY)
Bacteria, UA: NONE SEEN
Bilirubin Urine: NEGATIVE
Glucose, UA: NEGATIVE mg/dL
Ketones, ur: NEGATIVE mg/dL
NITRITE: NEGATIVE
PROTEIN: 30 mg/dL — AB
Specific Gravity, Urine: 1.017 (ref 1.005–1.030)
pH: 5 (ref 5.0–8.0)

## 2015-02-06 LAB — COMPREHENSIVE METABOLIC PANEL
ALT: 9 U/L — ABNORMAL LOW (ref 17–63)
ANION GAP: 8 (ref 5–15)
AST: 13 U/L — AB (ref 15–41)
Albumin: 2.6 g/dL — ABNORMAL LOW (ref 3.5–5.0)
Alkaline Phosphatase: 58 U/L (ref 38–126)
BUN: 29 mg/dL — ABNORMAL HIGH (ref 6–20)
CO2: 25 mmol/L (ref 22–32)
Calcium: 7.5 mg/dL — ABNORMAL LOW (ref 8.9–10.3)
Chloride: 105 mmol/L (ref 101–111)
Creatinine, Ser: 1.63 mg/dL — ABNORMAL HIGH (ref 0.61–1.24)
GFR calc Af Amer: 46 mL/min — ABNORMAL LOW (ref >60)
GFR calc non Af Amer: 39 mL/min — ABNORMAL LOW (ref >60)
GLUCOSE: 133 mg/dL — AB (ref 65–99)
Potassium: 3.8 mmol/L (ref 3.5–5.1)
Sodium: 138 mmol/L (ref 135–145)
TOTAL PROTEIN: 6 g/dL — AB (ref 6.5–8.1)
Total Bilirubin: 0.6 mg/dL (ref 0.3–1.2)

## 2015-02-06 LAB — PROTIME-INR
INR: 1.13
Prothrombin Time: 14.7 seconds (ref 11.4–15.0)

## 2015-02-06 LAB — CBC
HCT: 31.6 % — ABNORMAL LOW (ref 40.0–52.0)
Hemoglobin: 10 g/dL — ABNORMAL LOW (ref 13.0–18.0)
MCH: 25.4 pg — ABNORMAL LOW (ref 26.0–34.0)
MCHC: 31.8 g/dL — AB (ref 32.0–36.0)
MCV: 80 fL (ref 80.0–100.0)
Platelets: 237 10*3/uL (ref 150–440)
RBC: 3.95 MIL/uL — ABNORMAL LOW (ref 4.40–5.90)
RDW: 14.5 % (ref 11.5–14.5)
WBC: 30 10*3/uL — AB (ref 3.8–10.6)

## 2015-02-06 LAB — APTT: aPTT: 36 seconds (ref 24–36)

## 2015-02-06 LAB — MAGNESIUM: MAGNESIUM: 1.2 mg/dL — AB (ref 1.7–2.4)

## 2015-02-06 SURGERY — LAPAROSCOPIC CHOLECYSTECTOMY WITH INTRAOPERATIVE CHOLANGIOGRAM
Anesthesia: General | Wound class: Clean Contaminated

## 2015-02-06 MED ORDER — BUPIVACAINE HCL (PF) 0.25 % IJ SOLN
INTRAMUSCULAR | Status: DC | PRN
  Administered 2015-02-06: 30 mL

## 2015-02-06 MED ORDER — MIDAZOLAM HCL 2 MG/2ML IJ SOLN
INTRAMUSCULAR | Status: DC | PRN
  Administered 2015-02-06: 2 mg via INTRAVENOUS

## 2015-02-06 MED ORDER — LACTATED RINGERS IV SOLN
INTRAVENOUS | Status: DC | PRN
  Administered 2015-02-06 (×2): via INTRAVENOUS

## 2015-02-06 MED ORDER — LIDOCAINE HCL (CARDIAC) 20 MG/ML IV SOLN
INTRAVENOUS | Status: DC | PRN
  Administered 2015-02-06: 100 mg via INTRAVENOUS

## 2015-02-06 MED ORDER — ROPINIROLE HCL 1 MG PO TABS
2.0000 mg | ORAL_TABLET | Freq: Every day | ORAL | Status: DC
  Administered 2015-02-06 – 2015-02-07 (×2): 2 mg via ORAL
  Filled 2015-02-06: qty 2

## 2015-02-06 MED ORDER — ENOXAPARIN SODIUM 40 MG/0.4ML ~~LOC~~ SOLN
40.0000 mg | SUBCUTANEOUS | Status: DC
  Administered 2015-02-07: 40 mg via SUBCUTANEOUS
  Filled 2015-02-06: qty 0.4

## 2015-02-06 MED ORDER — IPRATROPIUM-ALBUTEROL 0.5-2.5 (3) MG/3ML IN SOLN: 3.0000 mL | RESPIRATORY_TRACT | Status: DC

## 2015-02-06 MED ORDER — KETOROLAC TROMETHAMINE 30 MG/ML IJ SOLN
INTRAMUSCULAR | Status: DC | PRN
  Administered 2015-02-06: 30 mg via INTRAVENOUS

## 2015-02-06 MED ORDER — IPRATROPIUM-ALBUTEROL 0.5-2.5 (3) MG/3ML IN SOLN
3.0000 mL | Freq: Once | RESPIRATORY_TRACT | Status: AC
  Administered 2015-02-06: 3 mL via RESPIRATORY_TRACT

## 2015-02-06 MED ORDER — FENTANYL CITRATE (PF) 100 MCG/2ML IJ SOLN: 25.0000 ug | INTRAMUSCULAR | Status: DC | PRN

## 2015-02-06 MED ORDER — OXYCODONE-ACETAMINOPHEN 5-325 MG PO TABS: 1.0000 | ORAL_TABLET | ORAL | Status: DC | PRN

## 2015-02-06 MED ORDER — DEXAMETHASONE SODIUM PHOSPHATE 4 MG/ML IJ SOLN
INTRAMUSCULAR | Status: DC | PRN
  Administered 2015-02-06: 10 mg via INTRAVENOUS

## 2015-02-06 MED ORDER — PHENYLEPHRINE HCL 10 MG/ML IJ SOLN
INTRAMUSCULAR | Status: DC | PRN
  Administered 2015-02-06 (×2): 100 ug via INTRAVENOUS

## 2015-02-06 MED ORDER — GLYCOPYRROLATE 0.2 MG/ML IJ SOLN
INTRAMUSCULAR | Status: DC | PRN
  Administered 2015-02-06: 1 mg via INTRAVENOUS

## 2015-02-06 MED ORDER — ACETAMINOPHEN 10 MG/ML IV SOLN
INTRAVENOUS | Status: DC | PRN
  Administered 2015-02-06: 1000 mg via INTRAVENOUS

## 2015-02-06 MED ORDER — ONDANSETRON HCL 4 MG/2ML IJ SOLN
INTRAMUSCULAR | Status: DC | PRN
  Administered 2015-02-06: 4 mg via INTRAVENOUS

## 2015-02-06 MED ORDER — ONDANSETRON HCL 4 MG/2ML IJ SOLN: 4.0000 mg | Freq: Once | INTRAMUSCULAR | Status: AC | PRN

## 2015-02-06 MED ORDER — FENTANYL CITRATE (PF) 100 MCG/2ML IJ SOLN
INTRAMUSCULAR | Status: DC | PRN
  Administered 2015-02-06 (×3): 50 ug via INTRAVENOUS
  Administered 2015-02-06: 100 ug via INTRAVENOUS

## 2015-02-06 MED ORDER — NEOSTIGMINE METHYLSULFATE 10 MG/10ML IV SOLN
INTRAVENOUS | Status: DC | PRN
  Administered 2015-02-06: 5 mg via INTRAVENOUS

## 2015-02-06 MED ORDER — EPHEDRINE SULFATE 50 MG/ML IJ SOLN
INTRAMUSCULAR | Status: DC | PRN
  Administered 2015-02-06: 10 mg via INTRAVENOUS

## 2015-02-06 MED ORDER — ROPINIROLE HCL ER 2 MG PO TB24: 2.0000 mg | ORAL_TABLET | Freq: Every day | ORAL | Status: DC

## 2015-02-06 MED ORDER — HYDROMORPHONE HCL 1 MG/ML IJ SOLN: 0.2500 mg | INTRAMUSCULAR | Status: DC | PRN

## 2015-02-06 MED ORDER — ROCURONIUM BROMIDE 100 MG/10ML IV SOLN
INTRAVENOUS | Status: DC | PRN
  Administered 2015-02-06 (×2): 10 mg via INTRAVENOUS
  Administered 2015-02-06: 30 mg via INTRAVENOUS
  Administered 2015-02-06: 10 mg via INTRAVENOUS

## 2015-02-06 MED ORDER — HYDROMORPHONE HCL 1 MG/ML IJ SOLN: 1.0000 mg | INTRAMUSCULAR | Status: DC | PRN

## 2015-02-06 MED ORDER — PROPOFOL 10 MG/ML IV BOLUS
INTRAVENOUS | Status: DC | PRN
  Administered 2015-02-06: 80 mg via INTRAVENOUS
  Administered 2015-02-06: 170 mg via INTRAVENOUS

## 2015-02-06 SURGICAL SUPPLY — 35 items
ANCHOR TIS RET SYS 235ML (MISCELLANEOUS) ×2 IMPLANT
APPLIER CLIP ROT 10 11.4 M/L (STAPLE) ×2
BAG COUNTER SPONGE EZ (MISCELLANEOUS) ×2 IMPLANT
BULB RESERV EVAC DRAIN JP 100C (MISCELLANEOUS) ×2 IMPLANT
CANISTER SUCT 1200ML W/VALVE (MISCELLANEOUS) ×2 IMPLANT
CHLORAPREP W/TINT 26ML (MISCELLANEOUS) ×2 IMPLANT
CLIP APPLIE ROT 10 11.4 M/L (STAPLE) ×1 IMPLANT
DRAIN CHANNEL JP 19F (MISCELLANEOUS) ×2 IMPLANT
DRAPE SHEET LG 3/4 BI-LAMINATE (DRAPES) ×2 IMPLANT
DRSG TEGADERM 2-3/8X2-3/4 SM (GAUZE/BANDAGES/DRESSINGS) ×2 IMPLANT
DRSG TELFA 3X8 NADH (GAUZE/BANDAGES/DRESSINGS) ×2 IMPLANT
GLOVE BIO SURGEON STRL SZ7.5 (GLOVE) ×2 IMPLANT
GLOVE INDICATOR 8.0 STRL GRN (GLOVE) ×2 IMPLANT
GOWN STRL REUS W/ TWL LRG LVL3 (GOWN DISPOSABLE) ×1 IMPLANT
GOWN STRL REUS W/TWL LRG LVL3 (GOWN DISPOSABLE) ×1
GRASPER SUT TROCAR 14GX15 (MISCELLANEOUS) ×2 IMPLANT
HEMOSTAT APPLICATOR AVITENE (MISCELLANEOUS) ×2 IMPLANT
IRRIGATION STRYKERFLOW (MISCELLANEOUS) ×1 IMPLANT
IRRIGATOR STRYKERFLOW (MISCELLANEOUS) ×2
NEEDLE 18GX1X1/2 (RX/OR ONLY) (NEEDLE) ×2 IMPLANT
NEEDLE HYPO 25X1 1.5 SAFETY (NEEDLE) ×2 IMPLANT
NEEDLE INSUFFLATION 14GA 120MM (NEEDLE) ×2 IMPLANT
PACK LAP CHOLECYSTECTOMY (MISCELLANEOUS) ×2 IMPLANT
PAD GROUND ADULT SPLIT (MISCELLANEOUS) ×2 IMPLANT
POUCH ENDO CATCH 10MM SPEC (MISCELLANEOUS) ×2 IMPLANT
SEAL FOR SCOPE WARMER C3101 (MISCELLANEOUS) ×2 IMPLANT
SLEEVE ADV FIXATION 5X100MM (TROCAR) ×2 IMPLANT
STRAP SAFETY BODY (MISCELLANEOUS) ×2 IMPLANT
SUT ETHILON 3-0 KS 30 BLK (SUTURE) ×2 IMPLANT
SUT VIC AB 0 CT2 27 (SUTURE) ×2 IMPLANT
SYR 3ML LL SCALE MARK (SYRINGE) ×2 IMPLANT
TROCAR Z-THREAD FIOS 11X100 BL (TROCAR) ×2 IMPLANT
TROCAR Z-THREAD OPTICAL 5X100M (TROCAR) ×2 IMPLANT
TROCAR Z-THREAD SLEEVE 11X100 (TROCAR) ×4 IMPLANT
TUBING INSUFFLATOR HI FLOW (MISCELLANEOUS) ×2 IMPLANT

## 2015-02-06 NOTE — Transfer of Care (Signed)
Immediate Anesthesia Transfer of Care Note  Patient: Corey Johnson  Procedure(s) Performed: Procedure(s): LAPAROSCOPIC CHOLECYSTECTOMY WITH INTRAOPERATIVE CHOLANGIOGRAM (N/A)  Patient Location: PACU  Anesthesia Type:General  Level of Consciousness: sedated  Airway & Oxygen Therapy: Patient connected to face mask oxygen  Post-op Assessment: Report given to RN  Post vital signs: stable  Last Vitals:  Filed Vitals:   02/06/15 0848  BP: 122/59  Pulse: 81  Temp: 37.7 C  Resp: 20    Complications: No apparent anesthesia complications

## 2015-02-06 NOTE — Op Note (Signed)
02/05/2015 - 02/06/2015  1:50 PM  PATIENT:  Corey Johnson  74 y.o. male  PRE-OPERATIVE DIAGNOSIS:  acute cholecystitis  POST-OPERATIVE DIAGNOSIS:  acute cholecystitis  PROCEDURE:  Procedure(s): LAPAROSCOPIC CHOLECYSTECTOMY WITH INTRAOPERATIVE CHOLANGIOGRAM (N/A)  SURGEON:  Surgeon(s) and Role:    * Dia Crawford III, MD - Primary   ASSISTANTS: none   ANESTHESIA:   general  EBL:  Total I/O In: 1400 [I.V.:1400] Out: 150 [Blood:150]   DRAINS: (Bed of the liver) Jackson-Pratt drain(s) with closed bulb suction in the Bed of the liver   LOCAL MEDICATIONS USED:  MARCAINE      DISPOSITION OF SPECIMEN:  PATHOLOGY   DICTATION: .Dragon Dictation with the patient supine position infection appropriate general anesthesia the patient Was prepped with ChloraPrep and draped sterile towels. The patient's place the headdown feet up position. A small supraumbilical incision was made in the standard fashion carried down bluntly to subcutaneous tissue. A varies needle was used to cannulate the peritoneal cavity. CO2 was insufflated to appropriate pressure measurements. When approximately 2 L of CO2 were instilled varies needle was withdrawn and a 11 mm port inserted into the peritoneal cavity. Intraperitoneal position was confirmed and CO2 was reinsufflated. The patient placed in head up feet down position rotated slightly to the left side. Subxiphoid transverse incision was made 11 mm port inserted under direct vision. 2 lateral ports 5 mm in size were started under direct vision.  Gallbladder was tense distended with areas of gangrene in the omentum was densely adherent to the gallbladder. Tedious dissection was required to expose the gallbladder. Care was taken to avoid injuring the right colon with transverse colon. The gallbladder was aspirated approximately 50 cc of dirty bile which made the gallbladder more mobile. However, because of the patient's great size and the angle is very difficult to  visualize the hepatoduodenal ligament. Mid abdominal transverse incision was made and another 11 mm port inserted under direct vision. A fan retractor was used to depress the right colon transverse colon and omentum to better expose the hepatoduodenal ligament.  Dissection was then carried out along the hepatoduodenal ligament. Cystic artery and cystic duct were identified. The cystic duct appeared to be quite short. Duct was doubly clipped and divided. The artery was doubly clipped and divided. The gallbladder was then dissected free from its bed in the liver using blunt dissection and hook cautery. Dissection was quite difficult. The gallbladder was large distended thickened and had multiple very large stones. The gallbladder captured in an Endo Catch apparatus and removed through the subxiphoid incision after enlarging it. The area was then copiously irrigated with warm saline solution.  A 19 [place] Blake drain was then inserted into the bed of the liver along with EndoAvitene. Drain was secured through one of the previous stab wounds. The abdomen was then desufflated. Midline fascias were closed with figure-of-eight sutures 0 Vicryl. The skin was closed with 5-0 nylon the drain secured with 3-0 nylon. The area was infiltrated with 0.25% Marcaine for postoperative pain control. Sterile dressings were applied. Patient was then turned recovery room having tolerated the procedure well. Spontaneously [place] correct 2 in the operating room.  PLAN OF CARE: Transfer back to medical surgical unit  PATIENT DISPOSITION:  PACU - hemodynamically stable.   Dia Crawford III, MD

## 2015-02-06 NOTE — Anesthesia Preprocedure Evaluation (Signed)
Anesthesia Evaluation  Patient identified by MRN, date of birth, ID band Patient awake    Reviewed: Allergy & Precautions, H&P , NPO status , Patient's Chart, lab work & pertinent test results, reviewed documented beta blocker date and time   Airway Mallampati: I  TM Distance: >3 FB Neck ROM: full    Dental   Pulmonary sleep apnea , Current Smoker,          Cardiovascular hypertension, Rhythm:regular Rate:Normal     Neuro/Psych    GI/Hepatic   Endo/Other    Renal/GU CRFRenal disease     Musculoskeletal   Abdominal   Peds  Hematology   Anesthesia Other Findings   Reproductive/Obstetrics                             Anesthesia Physical Anesthesia Plan  ASA: III  Anesthesia Plan: General ETT   Post-op Pain Management:    Induction:   Airway Management Planned:   Additional Equipment:   Intra-op Plan:   Post-operative Plan:   Informed Consent: I have reviewed the patients History and Physical, chart, labs and discussed the procedure including the risks, benefits and alternatives for the proposed anesthesia with the patient or authorized representative who has indicated his/her understanding and acceptance.     Plan Discussed with: CRNA  Anesthesia Plan Comments:         Anesthesia Quick Evaluation

## 2015-02-06 NOTE — Progress Notes (Signed)
REviewed noted and history.  Examined patient. Discussed at length.  Plan surgery later this am.   He and wife are in agreement

## 2015-02-06 NOTE — Anesthesia Postprocedure Evaluation (Signed)
  Anesthesia Post-op Note  Patient: Corey Johnson  Procedure(s) Performed: Procedure(s): LAPAROSCOPIC CHOLECYSTECTOMY WITH INTRAOPERATIVE CHOLANGIOGRAM (N/A)  Anesthesia type:General ETT  Patient location: PACU  Post pain: Pain level controlled  Post assessment: Post-op Vital signs reviewed, Patient's Cardiovascular Status Stable, Respiratory Function Stable, Patent Airway and No signs of Nausea or vomiting  Post vital signs: Reviewed and stable  Last Vitals:  Filed Vitals:   02/06/15 1445  BP:   Pulse:   Temp:   Resp: 20    Level of consciousness: awake, alert  and patient cooperative  Complications: No apparent anesthesia complications

## 2015-02-06 NOTE — Anesthesia Procedure Notes (Signed)
Procedure Name: Intubation Date/Time: 02/06/2015 11:55 AM Performed by: Nelda Marseille Pre-anesthesia Checklist: Patient identified, Emergency Drugs available, Suction available, Patient being monitored and Timeout performed Patient Re-evaluated:Patient Re-evaluated prior to inductionOxygen Delivery Method: Circle system utilized Preoxygenation: Pre-oxygenation with 100% oxygen Intubation Type: IV induction Ventilation: Mask ventilation without difficulty Laryngoscope Size: 3 Grade View: Grade II Tube size: 7.5 mm Number of attempts: 1 Dental Injury: Teeth and Oropharynx as per pre-operative assessment  Difficulty Due To: Difficulty was unanticipated

## 2015-02-06 NOTE — Progress Notes (Signed)
He is feeling much better this evening. He is tolerating liquid diet. He does not have any significant abdominal pain other than incisional pain. He does have marked serosanguineous drainage. Overall he is improved from this morning.

## 2015-02-07 ENCOUNTER — Encounter: Payer: Self-pay | Admitting: Surgery

## 2015-02-07 LAB — CBC
HCT: 29.2 % — ABNORMAL LOW (ref 40.0–52.0)
Hemoglobin: 9.6 g/dL — ABNORMAL LOW (ref 13.0–18.0)
MCH: 25.9 pg — AB (ref 26.0–34.0)
MCHC: 32.7 g/dL (ref 32.0–36.0)
MCV: 79.2 fL — ABNORMAL LOW (ref 80.0–100.0)
PLATELETS: 245 10*3/uL (ref 150–440)
RBC: 3.69 MIL/uL — AB (ref 4.40–5.90)
RDW: 14.6 % — AB (ref 11.5–14.5)
WBC: 19.9 10*3/uL — ABNORMAL HIGH (ref 3.8–10.6)

## 2015-02-07 LAB — COMPREHENSIVE METABOLIC PANEL
ALK PHOS: 57 U/L (ref 38–126)
ALT: 23 U/L (ref 17–63)
ANION GAP: 9 (ref 5–15)
AST: 31 U/L (ref 15–41)
Albumin: 2.6 g/dL — ABNORMAL LOW (ref 3.5–5.0)
BUN: 30 mg/dL — AB (ref 6–20)
CHLORIDE: 101 mmol/L (ref 101–111)
CO2: 25 mmol/L (ref 22–32)
Calcium: 7.5 mg/dL — ABNORMAL LOW (ref 8.9–10.3)
Creatinine, Ser: 1.61 mg/dL — ABNORMAL HIGH (ref 0.61–1.24)
GFR calc Af Amer: 46 mL/min — ABNORMAL LOW (ref >60)
GFR calc non Af Amer: 40 mL/min — ABNORMAL LOW (ref >60)
GLUCOSE: 175 mg/dL — AB (ref 65–99)
POTASSIUM: 4.2 mmol/L (ref 3.5–5.1)
SODIUM: 135 mmol/L (ref 135–145)
TOTAL PROTEIN: 6.3 g/dL — AB (ref 6.5–8.1)
Total Bilirubin: 0.3 mg/dL (ref 0.3–1.2)

## 2015-02-07 LAB — GLUCOSE, CAPILLARY
GLUCOSE-CAPILLARY: 143 mg/dL — AB (ref 65–99)
GLUCOSE-CAPILLARY: 182 mg/dL — AB (ref 65–99)
Glucose-Capillary: 180 mg/dL — ABNORMAL HIGH (ref 65–99)
Glucose-Capillary: 149 mg/dL — ABNORMAL HIGH (ref 65–99)
Glucose-Capillary: 142 mg/dL — ABNORMAL HIGH (ref 65–99)
Glucose-Capillary: 141 mg/dL — ABNORMAL HIGH (ref 65–99)

## 2015-02-07 LAB — URINE CULTURE: CULTURE: NO GROWTH

## 2015-02-07 MED ORDER — HYDROCOD POLST-CPM POLST ER 10-8 MG/5ML PO SUER
5.0000 mL | Freq: Two times a day (BID) | ORAL | Status: DC | PRN
  Administered 2015-02-07: 5 mL via ORAL
  Filled 2015-02-07 (×2): qty 5

## 2015-02-07 MED ORDER — SODIUM CHLORIDE 0.9 % IV BOLUS (SEPSIS)
500.0000 mL | Freq: Once | INTRAVENOUS | Status: AC
  Administered 2015-02-07: 500 mL via INTRAVENOUS

## 2015-02-07 NOTE — Progress Notes (Signed)
Patient ambulated around nursing station, used front rolling walker. Patient ambulated on room air and oxygen saturation after ambulation was 98%. VSS, No acute distress noted.

## 2015-02-07 NOTE — Progress Notes (Signed)
1 Day Post-Op   Subjective: He is feeling relatively well overall but does complain of some abdominal distention. His pain has improved. He does not have any nausea and is eating a liquid diet. His hemoglobin is stable at 9.6 but his creatinine is elevated to 1.6. He did not have significant urine output over the course of the evening.  Vital signs in last 24 hours: Temp:  [97.6 F (36.4 C)-99.8 F (37.7 C)] 97.6 F (36.4 C) (05/31 0802) Pulse Rate:  [58-106] 58 (05/31 0802) Resp:  [12-20] 17 (05/31 0506) BP: (97-133)/(53-68) 126/62 mmHg (05/31 0802) SpO2:  [89 %-96 %] 91 % (05/31 0802) Last BM Date: 02/04/15  Intake/Output from previous day: 05/30 0701 - 05/31 0700 In: 4488.3 [P.O.:290; I.V.:3873.3; IV Piggyback:200] Out: 460 [Urine:175; Drains:135; Blood:150]  GI: Abdomen is distended and moderately tender at his incisions with active bowel sounds. Did not appear to be any significant wound problems. He has moderate drainage.  Lab Results:  CBC  Recent Labs  02/06/15 0521 02/07/15 0530  WBC 30.0* 19.9*  HGB 10.0* 9.6*  HCT 31.6* 29.2*  PLT 237 245   CMP     Component Value Date/Time   NA 135 02/07/2015 0530   NA 134* 09/09/2014 0439   K 4.2 02/07/2015 0530   K 4.3 09/09/2014 0439   CL 101 02/07/2015 0530   CL 100 09/09/2014 0439   CO2 25 02/07/2015 0530   CO2 28 09/09/2014 0439   GLUCOSE 175* 02/07/2015 0530   GLUCOSE 163* 09/09/2014 0439   BUN 30* 02/07/2015 0530   BUN 17 09/09/2014 0439   CREATININE 1.61* 02/07/2015 0530   CREATININE 1.59* 09/09/2014 0439   CALCIUM 7.5* 02/07/2015 0530   CALCIUM 8.2* 09/09/2014 0439   PROT 6.3* 02/07/2015 0530   PROT 7.9 08/04/2012 1537   ALBUMIN 2.6* 02/07/2015 0530   ALBUMIN 4.1 08/04/2012 1537   AST 31 02/07/2015 0530   AST 23 08/04/2012 1537   ALT 23 02/07/2015 0530   ALT 20 08/04/2012 1537   ALKPHOS 57 02/07/2015 0530   ALKPHOS 107 08/04/2012 1537   BILITOT 0.3 02/07/2015 0530   GFRNONAA 40* 02/07/2015 0530    GFRNONAA 34* 08/04/2012 1537   GFRAA 46* 02/07/2015 0530   GFRAA 39* 08/04/2012 1537   PT/INR  Recent Labs  02/06/15 0521  LABPROT 14.7  INR 1.13    Studies/Results: Ct Abdomen Pelvis Wo Contrast  02/05/2015   CLINICAL DATA:  Right-sided abdominal pain for 2 days  EXAM: CT ABDOMEN AND PELVIS WITHOUT CONTRAST  TECHNIQUE: Multidetector CT imaging of the abdomen and pelvis was performed following the standard protocol without IV contrast.  COMPARISON:  None.  FINDINGS: The lung bases are free of acute infiltrate or sizable effusion. Minimal basilar atelectasis is noted. A few tiny subpleural nodules are noted. These measure less than 2 mm.  The liver, spleen, adrenal glands and pancreas are within normal limits. The gallbladder is well distended and demonstrates multiple gallstones with changes suggestive of gallbladder wall thickening and mild. Cholecystic inflammatory change.  The kidneys are well visualized and demonstrates some cystic change consistent with the given clinical history of end-stage renal disease. No obstructive changes are noted bilaterally. The bladder is partially distended. The prostate is within normal limits. No pelvic mass lesion is noted. The appendix is within normal limits. Bony structures show degenerative change without acute abnormality.  IMPRESSION: Cholelithiasis with gallbladder wall thickening and pericholecystic inflammatory change suspicious for acute cholecystitis.  Cystic change in the  kidneys consistent with the patient's given clinical history of renal disease.  Scattered subpleural nodules measuring less than 5 mm. If the patient is at high risk for bronchogenic carcinoma, follow-up chest CT at 1 year is recommended. If the patient is at low risk, no follow-up is needed. This recommendation follows the consensus statement: Guidelines for Management of Small Pulmonary Nodules Detected on CT Scans: A Statement from the [employer] as published in Radiology  2005; 237:395-400.   Electronically Signed   By: Inez Catalina M.D.   On: 02/05/2015 22:07   Dg Abd Portable 1v  02/05/2015   CLINICAL DATA:  Nausea, vomiting, and abdominal pain for 2 days.  EXAM: PORTABLE ABDOMEN - 1 VIEW  COMPARISON:  None.  FINDINGS: Portable upright view the abdomen demonstrates no evidence of free intra-abdominal air. There is air throughout small and large bowel loops in the abdomen. No definite dilated bowel loops to suggest obstruction. The pelvis is excluded from the field of view.  IMPRESSION: Nonobstructive bowel gas pattern.  No free intra-abdominal air.   Electronically Signed   By: Jeb Levering M.D.   On: 02/05/2015 21:43    Assessment/Plan: He is doing recently well overall. His bilirubin remains low. I'm going to give him a fluid bolus today and advance his diet and follow-up on his creatinine. We'll increase his activity level today. He has moderate JP drainage. Overall is doing quite well.

## 2015-02-08 LAB — GLUCOSE, CAPILLARY
GLUCOSE-CAPILLARY: 150 mg/dL — AB (ref 65–99)
GLUCOSE-CAPILLARY: 181 mg/dL — AB (ref 65–99)
Glucose-Capillary: 114 mg/dL — ABNORMAL HIGH (ref 65–99)

## 2015-02-08 LAB — BASIC METABOLIC PANEL
ANION GAP: 6 (ref 5–15)
BUN: 32 mg/dL — ABNORMAL HIGH (ref 6–20)
CO2: 26 mmol/L (ref 22–32)
CREATININE: 1.45 mg/dL — AB (ref 0.61–1.24)
Calcium: 7.6 mg/dL — ABNORMAL LOW (ref 8.9–10.3)
Chloride: 105 mmol/L (ref 101–111)
GFR calc non Af Amer: 45 mL/min — ABNORMAL LOW (ref >60)
GFR, EST AFRICAN AMERICAN: 52 mL/min — AB (ref >60)
Glucose, Bld: 172 mg/dL — ABNORMAL HIGH (ref 65–99)
Potassium: 4 mmol/L (ref 3.5–5.1)
SODIUM: 137 mmol/L (ref 135–145)

## 2015-02-08 LAB — SURGICAL PATHOLOGY

## 2015-02-08 MED ORDER — OXYCODONE-ACETAMINOPHEN 5-325 MG PO TABS: 1.0000 | ORAL_TABLET | ORAL | Status: DC | PRN

## 2015-02-08 NOTE — Progress Notes (Signed)
Physical Therapy Evaluation Patient Details Name: Corey Johnson MRN: [id number] DOB: 10-Jul-1941 Today's Date: 02/08/2015   History of Present Illness  Pt admitted for acute calculas cholecystitis. Pt with cholecystectomy on 02/06/15.   Clinical Impression  Pt is a pleasant 74 year old male who was admitted for cholecystitis.  Pt demonstrates all transfers/ambulation at baseline level. Pt also performed toileting with therapist with cues and supervision for safety. Pt independent with all hygiene. (97min). Pt does not require any further PT needs at this time. Pt will be dc in house and does not require follow up. RN aware. Will dc current orders.      Follow Up Recommendations No PT follow up    Equipment Recommendations   (has all equipment at home)    Recommendations for Other Services       Precautions / Restrictions Precautions Precautions: None Restrictions Weight Bearing Restrictions: No      Mobility  Bed Mobility               General bed mobility comments: not performed as pt in recliner upon arrival  Transfers Overall transfer level: Modified independent Equipment used: Rolling walker (2 wheeled)             General transfer comment: sit<>Stand with rw and safe technique.   Ambulation/Gait Ambulation/Gait assistance: Supervision Ambulation Distance (Feet): 190 Feet Assistive device: Rolling walker (2 wheeled) Gait Pattern/deviations: WFL(Within Functional Limits)     General Gait Details: ambulated using reciprocal gait pattern with safe technique including correct obstacle avoidance. Pt with good cadence and no SOB  Stairs            Wheelchair Mobility    Modified Rankin (Stroke Patients Only)       Balance                                             Pertinent Vitals/Pain Pain Assessment:  (complains of abdominal soreness but did not rate pain)    Home Living Family/patient expects to be discharged  to:: Private residence Living Arrangements: Spouse/significant other Available Help at Discharge: Family Type of Home: House Home Access: Stairs to enter Entrance Stairs-Rails:  (has rail, did not specify which side) Entrance Stairs-Number of Steps: 1 Home Layout: One level Home Equipment: Walker - 2 wheels;Walker - 4 wheels      Prior Function Level of Independence: Independent with assistive device(s)               Hand Dominance        Extremity/Trunk Assessment   Upper Extremity Assessment: Overall WFL for tasks assessed (reports L thumb numbness-chronic)           Lower Extremity Assessment: Overall WFL for tasks assessed         Communication   Communication: No difficulties  Cognition Arousal/Alertness: Awake/alert Behavior During Therapy: WFL for tasks assessed/performed Overall Cognitive Status: Within Functional Limits for tasks assessed                      General Comments      Exercises        Assessment/Plan    PT Assessment Patent does not need any further PT services  PT Diagnosis     PT Problem List    PT Treatment Interventions     PT Goals (Current goals can  be found in the Care Plan section) Acute Rehab PT Goals Patient Stated Goal: to go home PT Goal Formulation: With patient Time For Goal Achievement: Mar 02, 2015 Potential to Achieve Goals: Good    Frequency     Barriers to discharge        Co-evaluation               End of Session   Activity Tolerance: Patient tolerated treatment well Patient left: in chair Nurse Communication: Mobility status         Time: 1914-7829 PT Time Calculation (min) (ACUTE ONLY): 11 min   Charges:   PT Evaluation $Initial PT Evaluation Tier I: 1 Procedure PT Treatments $Therapeutic Activity: 8-22 mins   PT G Codes:        Dimitrius Steedman March 02, 2015, 9:58 AM Greggory Stallion, PT, DPT [phone]

## 2015-02-08 NOTE — Progress Notes (Signed)
Patient discharged to home as ordered. IV discontinued site without s/s of infiltration or infection. Patient given discharge instructions and prescriptions as ordered. Family at the bedside to take patient home. Dressing to the abdomen clean dry and intact.

## 2015-02-08 NOTE — Discharge Summary (Signed)
Patient ID: Corey Johnson MRN: [id number] DOB/AGE: 76/76/1942 76 y.o.  Admit date: 02/05/2015 Discharge date: 02/08/2015  Discharge Diagnoses:  Acute cholecystitis  Procedures Performed: Laparoscopic cholecystectomy  Discharged Condition: good  Hospital Course: The patient was admitted through the emergency room with abdominal discomfort clinical presentation and imaging findings consistent with probable acute cholecystitis. He had symptoms for some time when he was admitted. Imaging workup suggested acute cholecystitis with CT scan demonstrating several large gallstones gallbladder wall thickening and pericholecystic fluid. He was started on IV antibiotics with a plan to pursuing urgent surgical intervention. After appropriate preoperative preparation informed consent he was taken to surgery the following day where he underwent laparoscopic cholecystectomy. He has severely inflamed gallbladder with multiple stones and evidence for acute cholecystitis. Laparoscopic cholecystectomy was performed and a drain was left.  His postoperative course was largely unremarkable. He is continuing on IV antibiotics. He was able tolerate liquid diet and advance to soft diet by the first full hospital day. His drain was removed this morning. Wounds look good there is no sign of any infection. He is up active ambulating without difficulty. He is discharged home today profoundly office in 7-10 days time for wound check.  Discharge Orders: Discharge Instructions    Call MD for:  persistant nausea and vomiting    Complete by:  As directed      Call MD for:  redness, tenderness, or signs of infection (pain, swelling, redness, odor or green/yellow discharge around incision site)    Complete by:  As directed      Call MD for:  severe uncontrolled pain    Complete by:  As directed      Call MD for:  temperature >100.4    Complete by:  As directed      Diet - low sodium heart healthy    Complete by:  As  directed      Discharge instructions    Complete by:  As directed   No heavy lifting, no driving while taking pain medication, may bathe tomorrow.     Increase activity slowly    Complete by:  As directed      No wound care    Complete by:  As directed            Disposition:   Discharge Medications:  Current facility-administered medications:  .  acetaminophen (TYLENOL) tablet 650 mg, 650 mg, Oral, Q6H PRN **OR** acetaminophen (TYLENOL) suppository 650 mg, 650 mg, Rectal, Q6H PRN, Molly Maduro, MD .  chlorpheniramine-HYDROcodone (TUSSIONEX) 10-8 MG/5ML suspension 5 mL, 5 mL, Oral, Q12H PRN, Dia Crawford III, MD, 5 mL at 02/07/15 2205 .  colchicine tablet 0.6 mg, 0.6 mg, Oral, Daily, Molly Maduro, MD, 0.6 mg at 02/07/15 4627 .  diltiazem (CARDIZEM CD) 24 hr capsule 180 mg, 180 mg, Oral, Daily, Molly Maduro, MD, 180 mg at 02/07/15 0350 .  diphenhydrAMINE (BENADRYL) injection 12.5 mg, 12.5 mg, Intravenous, Q6H PRN **OR** diphenhydrAMINE (BENADRYL) 12.5 MG/5ML elixir 12.5 mg, 12.5 mg, Oral, Q6H PRN, Molly Maduro, MD .  enalapril (VASOTEC) tablet 20 mg, 20 mg, Oral, Daily, Molly Maduro, MD, 20 mg at 02/07/15 [phone] .  enoxaparin (LOVENOX) injection 40 mg, 40 mg, Subcutaneous, Q24H, Molly Maduro, MD, 40 mg at 02/07/15 1216 .  febuxostat (ULORIC) tablet 40 mg, 40 mg, Oral, Daily, Molly Maduro, MD, 40 mg at 02/07/15 0953 .  fentaNYL (SUBLIMAZE) injection 25 mcg, 25 mcg, Intravenous, Q5 min PRN, Molli Barrows, MD .  hydrochlorothiazide (HYDRODIURIL) tablet  25 mg, 25 mg, Oral, Daily, Molly Maduro, MD, 25 mg at 02/07/15 [phone] .  HYDROmorphone (DILAUDID) injection 0.25 mg, 0.25 mg, Intravenous, PRN, Molli Barrows, MD .  HYDROmorphone (DILAUDID) injection 1 mg, 1 mg, Intravenous, Q2H PRN, Dia Crawford III, MD .  oxyCODONE-acetaminophen (PERCOCET/ROXICET) 5-325 MG per tablet 1-2 tablet, 1-2 tablet, Oral, Q4H PRN, Dia Crawford III, MD .  pantoprazole (PROTONIX) EC tablet 40 mg,  40 mg, Oral, QAC breakfast, Molly Maduro, MD, 40 mg at 02/08/15 5631 .  pravastatin (PRAVACHOL) tablet 10 mg, 10 mg, Oral, Daily, Molly Maduro, MD, 10 mg at 02/07/15 0953 .  promethazine (PHENERGAN) tablet 25 mg, 25 mg, Oral, Q6H PRN **OR** promethazine (PHENERGAN) injection 25 mg, 25 mg, Intravenous, Q6H PRN, Molly Maduro, MD .  rOPINIRole (REQUIP) tablet 2 mg, 2 mg, Oral, QHS, Vira Blanco, RPH, 2 mg at 02/07/15 1610 .  terazosin (HYTRIN) capsule 5 mg, 5 mg, Oral, QHS, Molly Maduro, MD, 5 mg at 02/07/15 2201  Follwup: Follow-up Information    Follow up with [facility]. Schedule an appointment as soon as possible for a visit in 1 week.   Why:  For wound re-check   Contact information:   [place], [place] [phone]      Signed: Dia Crawford III 02/08/2015, 9:09 AM

## 2015-02-08 NOTE — Care Management (Signed)
Reviewed case prior to discharge, no consult ordered. No CM needs identified.

## 2015-02-08 NOTE — Discharge Instructions (Signed)

## 2015-02-08 NOTE — Progress Notes (Signed)
2 Days Post-Op   Subjective:  Doing well postop with no major medical concerns. He is up ambulating with no nausea eating a regular diet.  Vital signs in last 24 hours: Temp:  [97.5 F (36.4 C)-97.7 F (36.5 C)] 97.7 F (36.5 C) (06/01 0028) Pulse Rate:  [66-74] 66 (06/01 0028) Resp:  [20] 20 (06/01 0028) BP: (135-141)/(68-72) 141/72 mmHg (06/01 0028) SpO2:  [91 %] 91 % (06/01 0028) Last BM Date: 02/03/15  Intake/Output from previous day: 05/31 0701 - 06/01 0700 In: 4611 [P.O.:240; I.V.:3740; IV Piggyback:631] Out: 857.5 [Urine:800; Drains:57.5]  GI: His wounds look good. There is no sign of any infection. He has some mild abdominal distention but much less tenderness.  Lab Results:  CBC  Recent Labs  02/06/15 0521 02/07/15 0530  WBC 30.0* 19.9*  HGB 10.0* 9.6*  HCT 31.6* 29.2*  PLT 237 245   CMP     Component Value Date/Time   NA 137 02/08/2015 0449   NA 134* 09/09/2014 0439   K 4.0 02/08/2015 0449   K 4.3 09/09/2014 0439   CL 105 02/08/2015 0449   CL 100 09/09/2014 0439   CO2 26 02/08/2015 0449   CO2 28 09/09/2014 0439   GLUCOSE 172* 02/08/2015 0449   GLUCOSE 163* 09/09/2014 0439   BUN 32* 02/08/2015 0449   BUN 17 09/09/2014 0439   CREATININE 1.45* 02/08/2015 0449   CREATININE 1.59* 09/09/2014 0439   CALCIUM 7.6* 02/08/2015 0449   CALCIUM 8.2* 09/09/2014 0439   PROT 6.3* 02/07/2015 0530   PROT 7.9 08/04/2012 1537   ALBUMIN 2.6* 02/07/2015 0530   ALBUMIN 4.1 08/04/2012 1537   AST 31 02/07/2015 0530   AST 23 08/04/2012 1537   ALT 23 02/07/2015 0530   ALT 20 08/04/2012 1537   ALKPHOS 57 02/07/2015 0530   ALKPHOS 107 08/04/2012 1537   BILITOT 0.3 02/07/2015 0530   GFRNONAA 45* 02/08/2015 0449   GFRNONAA 34* 08/04/2012 1537   GFRAA 52* 02/08/2015 0449   GFRAA 39* 08/04/2012 1537   PT/INR  Recent Labs  02/06/15 0521  LABPROT 14.7  INR 1.13    Studies/Results: No results found.  Assessment/Plan: He is doing well overall. We will plan to  remove his drain today and discuss possible discharge with him overall he is markedly improved since surgery.

## 2015-02-10 LAB — CULTURE, BLOOD (ROUTINE X 2)
CULTURE: NO GROWTH
CULTURE: NO GROWTH

## 2015-02-13 ENCOUNTER — Inpatient Hospital Stay: Payer: Medicare HMO

## 2015-02-13 ENCOUNTER — Emergency Department: Payer: Medicare HMO

## 2015-02-13 ENCOUNTER — Encounter: Payer: Self-pay | Admitting: Emergency Medicine

## 2015-02-13 ENCOUNTER — Inpatient Hospital Stay
Admission: EM | Admit: 2015-02-13 | Discharge: 2015-02-17 | DRG: 684 | Disposition: A | Discharge status: Home / Self Care | Payer: Medicare HMO | Attending: Internal Medicine | Admitting: Internal Medicine

## 2015-02-13 ENCOUNTER — Telehealth: Payer: Self-pay | Admitting: Surgery

## 2015-02-13 DIAGNOSIS — I10 Essential (primary) hypertension: Secondary | ICD-10-CM | POA: Insufficient documentation

## 2015-02-13 DIAGNOSIS — E559 Vitamin D deficiency, unspecified: Secondary | ICD-10-CM | POA: Diagnosis present

## 2015-02-13 DIAGNOSIS — G4733 Obstructive sleep apnea (adult) (pediatric): Secondary | ICD-10-CM | POA: Diagnosis present

## 2015-02-13 DIAGNOSIS — E785 Hyperlipidemia, unspecified: Secondary | ICD-10-CM | POA: Diagnosis present

## 2015-02-13 DIAGNOSIS — Z9049 Acquired absence of other specified parts of digestive tract: Secondary | ICD-10-CM | POA: Diagnosis present

## 2015-02-13 DIAGNOSIS — I129 Hypertensive chronic kidney disease with stage 1 through stage 4 chronic kidney disease, or unspecified chronic kidney disease: Secondary | ICD-10-CM | POA: Diagnosis present

## 2015-02-13 DIAGNOSIS — Z87891 Personal history of nicotine dependence: Secondary | ICD-10-CM

## 2015-02-13 DIAGNOSIS — Z9889 Other specified postprocedural states: Secondary | ICD-10-CM | POA: Diagnosis not present

## 2015-02-13 DIAGNOSIS — N1330 Unspecified hydronephrosis: Secondary | ICD-10-CM | POA: Diagnosis present

## 2015-02-13 DIAGNOSIS — R338 Other retention of urine: Secondary | ICD-10-CM | POA: Diagnosis present

## 2015-02-13 DIAGNOSIS — R140 Abdominal distension (gaseous): Secondary | ICD-10-CM | POA: Diagnosis present

## 2015-02-13 DIAGNOSIS — R7989 Other specified abnormal findings of blood chemistry: Secondary | ICD-10-CM | POA: Diagnosis present

## 2015-02-13 DIAGNOSIS — K5900 Constipation, unspecified: Secondary | ICD-10-CM | POA: Diagnosis present

## 2015-02-13 DIAGNOSIS — Z79899 Other long term (current) drug therapy: Secondary | ICD-10-CM | POA: Diagnosis not present

## 2015-02-13 DIAGNOSIS — R7302 Impaired glucose tolerance (oral): Secondary | ICD-10-CM | POA: Diagnosis present

## 2015-02-13 DIAGNOSIS — Z8249 Family history of ischemic heart disease and other diseases of the circulatory system: Secondary | ICD-10-CM

## 2015-02-13 DIAGNOSIS — Z96659 Presence of unspecified artificial knee joint: Secondary | ICD-10-CM | POA: Diagnosis present

## 2015-02-13 DIAGNOSIS — L409 Psoriasis, unspecified: Secondary | ICD-10-CM | POA: Insufficient documentation

## 2015-02-13 DIAGNOSIS — M1990 Unspecified osteoarthritis, unspecified site: Secondary | ICD-10-CM | POA: Diagnosis present

## 2015-02-13 DIAGNOSIS — N179 Acute kidney failure, unspecified: Principal | ICD-10-CM | POA: Diagnosis present

## 2015-02-13 DIAGNOSIS — N401 Enlarged prostate with lower urinary tract symptoms: Secondary | ICD-10-CM | POA: Diagnosis present

## 2015-02-13 DIAGNOSIS — G4730 Sleep apnea, unspecified: Secondary | ICD-10-CM | POA: Insufficient documentation

## 2015-02-13 DIAGNOSIS — G2581 Restless legs syndrome: Secondary | ICD-10-CM | POA: Diagnosis present

## 2015-02-13 DIAGNOSIS — Z79811 Long term (current) use of aromatase inhibitors: Secondary | ICD-10-CM | POA: Diagnosis not present

## 2015-02-13 DIAGNOSIS — J309 Allergic rhinitis, unspecified: Secondary | ICD-10-CM | POA: Insufficient documentation

## 2015-02-13 DIAGNOSIS — N183 Chronic kidney disease, stage 3 (moderate): Secondary | ICD-10-CM | POA: Diagnosis present

## 2015-02-13 DIAGNOSIS — M109 Gout, unspecified: Secondary | ICD-10-CM | POA: Diagnosis present

## 2015-02-13 DIAGNOSIS — N400 Enlarged prostate without lower urinary tract symptoms: Secondary | ICD-10-CM | POA: Insufficient documentation

## 2015-02-13 DIAGNOSIS — K219 Gastro-esophageal reflux disease without esophagitis: Secondary | ICD-10-CM | POA: Diagnosis present

## 2015-02-13 DIAGNOSIS — N189 Chronic kidney disease, unspecified: Secondary | ICD-10-CM

## 2015-02-13 HISTORY — DX: Vitamin D deficiency, unspecified: E55.9

## 2015-02-13 HISTORY — DX: Hyperlipidemia, unspecified: E78.5

## 2015-02-13 HISTORY — DX: Gout, unspecified: M10.9

## 2015-02-13 HISTORY — DX: Chronic kidney disease, unspecified: N18.9

## 2015-02-13 HISTORY — DX: Benign prostatic hyperplasia without lower urinary tract symptoms: N40.0

## 2015-02-13 HISTORY — DX: Other specified bacterial intestinal infections: A04.8

## 2015-02-13 HISTORY — DX: Psoriasis, unspecified: L40.9

## 2015-02-13 LAB — URINALYSIS COMPLETE WITH MICROSCOPIC (ARMC ONLY)
BACTERIA UA: NONE SEEN
Bilirubin Urine: NEGATIVE
Glucose, UA: NEGATIVE mg/dL
Ketones, ur: NEGATIVE mg/dL
Nitrite: NEGATIVE
PROTEIN: NEGATIVE mg/dL
Specific Gravity, Urine: 1.009 (ref 1.005–1.030)
pH: 5 (ref 5.0–8.0)

## 2015-02-13 LAB — COMPREHENSIVE METABOLIC PANEL
ALBUMIN: 2.7 g/dL — AB (ref 3.5–5.0)
ALK PHOS: 70 U/L (ref 38–126)
ALT: 29 U/L (ref 17–63)
ANION GAP: 13 (ref 5–15)
AST: 24 U/L (ref 15–41)
BUN: 53 mg/dL — ABNORMAL HIGH (ref 6–20)
CALCIUM: 8.4 mg/dL — AB (ref 8.9–10.3)
CO2: 27 mmol/L (ref 22–32)
Chloride: 101 mmol/L (ref 101–111)
Creatinine, Ser: 4.08 mg/dL — ABNORMAL HIGH (ref 0.61–1.24)
GFR calc non Af Amer: 13 mL/min — ABNORMAL LOW (ref >60)
GFR, EST AFRICAN AMERICAN: 15 mL/min — AB (ref >60)
Glucose, Bld: 98 mg/dL (ref 65–99)
Potassium: 4.3 mmol/L (ref 3.5–5.1)
SODIUM: 141 mmol/L (ref 135–145)
TOTAL PROTEIN: 7.1 g/dL (ref 6.5–8.1)
Total Bilirubin: 0.4 mg/dL (ref 0.3–1.2)

## 2015-02-13 LAB — CBC
HCT: 32.1 % — ABNORMAL LOW (ref 40.0–52.0)
Hemoglobin: 10.4 g/dL — ABNORMAL LOW (ref 13.0–18.0)
MCH: 26 pg (ref 26.0–34.0)
MCHC: 32.4 g/dL (ref 32.0–36.0)
MCV: 80.3 fL (ref 80.0–100.0)
PLATELETS: 556 10*3/uL — AB (ref 150–440)
RBC: 4 MIL/uL — AB (ref 4.40–5.90)
RDW: 14.6 % — AB (ref 11.5–14.5)
WBC: 17.1 10*3/uL — ABNORMAL HIGH (ref 3.8–10.6)

## 2015-02-13 MED ORDER — DILTIAZEM HCL ER COATED BEADS 180 MG PO CP24
180.0000 mg | ORAL_CAPSULE | Freq: Every day | ORAL | Status: DC
  Administered 2015-02-13 – 2015-02-15 (×3): 180 mg via ORAL
  Filled 2015-02-13 (×3): qty 1

## 2015-02-13 MED ORDER — COLCHICINE 0.6 MG PO TABS
0.6000 mg | ORAL_TABLET | Freq: Every day | ORAL | Status: DC | PRN
Start: 2015-02-13 — End: 2015-02-17

## 2015-02-13 MED ORDER — ROPINIROLE HCL 1 MG PO TABS
1.0000 mg | ORAL_TABLET | Freq: Every day | ORAL | Status: DC
  Administered 2015-02-13 – 2015-02-16 (×4): 2 mg via ORAL
  Filled 2015-02-13 (×4): qty 2

## 2015-02-13 MED ORDER — FEBUXOSTAT 40 MG PO TABS
40.0000 mg | ORAL_TABLET | Freq: Every day | ORAL | Status: DC
  Administered 2015-02-14 – 2015-02-17 (×4): 40 mg via ORAL
  Filled 2015-02-13 (×6): qty 1

## 2015-02-13 MED ORDER — ALUM & MAG HYDROXIDE-SIMETH 200-200-20 MG/5ML PO SUSP
30.0000 mL | Freq: Four times a day (QID) | ORAL | Status: DC | PRN
  Administered 2015-02-13: 30 mL via ORAL
  Filled 2015-02-13: qty 30

## 2015-02-13 MED ORDER — HEPARIN SODIUM (PORCINE) 5000 UNIT/ML IJ SOLN
5000.0000 [IU] | Freq: Three times a day (TID) | INTRAMUSCULAR | Status: DC
  Administered 2015-02-13 – 2015-02-17 (×11): 5000 [IU] via SUBCUTANEOUS
  Filled 2015-02-13 (×11): qty 1

## 2015-02-13 MED ORDER — CHOLESTYRAMINE 4 G PO PACK: 4.0000 g | PACK | Freq: Three times a day (TID) | ORAL | Status: DC | PRN

## 2015-02-13 MED ORDER — SODIUM CHLORIDE 0.9 % IV SOLN
1000.0000 mL | Freq: Once | INTRAVENOUS | Status: AC
  Administered 2015-02-13: 1000 mL via INTRAVENOUS

## 2015-02-13 MED ORDER — PANTOPRAZOLE SODIUM 40 MG PO TBEC
40.0000 mg | DELAYED_RELEASE_TABLET | Freq: Every day | ORAL | Status: DC
  Administered 2015-02-13 – 2015-02-17 (×5): 40 mg via ORAL
  Filled 2015-02-13 (×5): qty 1

## 2015-02-13 MED ORDER — PRAVASTATIN SODIUM 10 MG PO TABS
10.0000 mg | ORAL_TABLET | Freq: Every day | ORAL | Status: DC
  Administered 2015-02-13 – 2015-02-16 (×4): 10 mg via ORAL
  Filled 2015-02-13 (×4): qty 1

## 2015-02-13 MED ORDER — OXYCODONE-ACETAMINOPHEN 5-325 MG PO TABS
1.0000 | ORAL_TABLET | ORAL | Status: DC | PRN
  Administered 2015-02-13: 2 via ORAL
  Filled 2015-02-13: qty 2

## 2015-02-13 MED ORDER — ONDANSETRON HCL 4 MG/2ML IJ SOLN: 4.0000 mg | Freq: Four times a day (QID) | INTRAMUSCULAR | Status: DC | PRN

## 2015-02-13 MED ORDER — SODIUM CHLORIDE 0.9 % IV SOLN
INTRAVENOUS | Status: DC
  Administered 2015-02-13 – 2015-02-15 (×5): via INTRAVENOUS

## 2015-02-13 MED ORDER — CHOLECALCIFEROL 25 MCG (1000 UT) PO TABS
1000.0000 [IU] | ORAL_TABLET | Freq: Every day | ORAL | Status: DC
  Administered 2015-02-14 – 2015-02-16 (×2): 1000 [IU] via ORAL
  Filled 2015-02-13 (×7): qty 1

## 2015-02-13 MED ORDER — ACETAMINOPHEN 325 MG PO TABS
650.0000 mg | ORAL_TABLET | Freq: Four times a day (QID) | ORAL | Status: DC | PRN
  Administered 2015-02-17: 650 mg via ORAL
  Filled 2015-02-13: qty 2

## 2015-02-13 MED ORDER — TERAZOSIN HCL 5 MG PO CAPS
5.0000 mg | ORAL_CAPSULE | Freq: Every day | ORAL | Status: DC
  Administered 2015-02-13 – 2015-02-14 (×2): 5 mg via ORAL
  Filled 2015-02-13 (×2): qty 1

## 2015-02-13 MED ORDER — ACETAMINOPHEN 650 MG RE SUPP: 650.0000 mg | Freq: Four times a day (QID) | RECTAL | Status: DC | PRN

## 2015-02-13 MED ORDER — ONDANSETRON HCL 4 MG PO TABS: 4.0000 mg | ORAL_TABLET | Freq: Four times a day (QID) | ORAL | Status: DC | PRN

## 2015-02-13 MED ORDER — IOHEXOL 240 MG/ML SOLN
25.0000 mL | INTRAMUSCULAR | Status: AC
  Administered 2015-02-13: 25 mL via ORAL

## 2015-02-13 NOTE — Telephone Encounter (Signed)
Patient's wife called reporting that he has not had a BM since his surgery on 02/06/15.  The patient has tried 2 enemas and has taken laxatives with no result. The patient also reported having difficulty urinating. I directed the patient's wife to take him immediately to the ED for evaluation and constipation relief. Patient's wife confirmed understanding of information and direction.

## 2015-02-13 NOTE — ED Notes (Signed)
Per EDP, pt took his own oxycodone for pain.

## 2015-02-13 NOTE — ED Notes (Signed)
Cholecystectomy one week ago

## 2015-02-13 NOTE — ED Provider Notes (Addendum)
[facility] Emergency Department Provider Note  ____________________________________________  Time seen: 10:30 AM  I have reviewed the triage vital signs and the nursing notes.   HISTORY  Chief Complaint Urinary Retention      HPI Corey Johnson is a 74 y.o. male who presents with complains of constipation and difficulty urinating. Discharged from the hospital 6 days ago after her cholecystectomy. He reports he has not had a proper bowel movement in about a week and a half. His primary complaint is that he is having only small amounts of urine despite straining. Denies abdominal pain. He denies fever. He denies dysuria. He has never had this problem before. No nausea no vomiting. Reports the severity is moderate     Past Medical History  Diagnosis Date  . Arthritis   . Hypertension   . Renal disorder   . Sleep apnea     CPAP    Patient Active Problem List   Diagnosis Date Noted  . Acute calculous cholecystitis 02/05/2015    Past Surgical History  Procedure Laterality Date  . Joint replacement    . Cholecystectomy N/A 02/06/2015    Procedure: LAPAROSCOPIC CHOLECYSTECTOMY WITH INTRAOPERATIVE CHOLANGIOGRAM;  Surgeon: Dia Crawford III, MD;  Location: [facility] ORS;  Service: General;  Laterality: N/A;    Current Outpatient Rx  Name  Route  Sig  Dispense  Refill  . Cholecalciferol 1000 UNITS tablet   Oral   Take 1,000 Units by mouth daily.         . cholestyramine (QUESTRAN) 4 G packet   Oral   Take 4 g by mouth 3 (three) times daily as needed (for diarrhea).          . colchicine 0.6 MG tablet   Oral   Take 0.6 mg by mouth daily as needed (for gout).          Marland Kitchen diltiazem (CARDIZEM CD) 180 MG 24 hr capsule   Oral   Take 180 mg by mouth daily.         . enalapril (VASOTEC) 20 MG tablet   Oral   Take 20 mg by mouth daily.         . febuxostat (ULORIC) 40 MG tablet   Oral   Take 40 mg by mouth daily.         .  hydrochlorothiazide (HYDRODIURIL) 25 MG tablet   Oral   Take 25 mg by mouth daily.         Marland Kitchen omeprazole (PRILOSEC) 40 MG capsule   Oral   Take 40 mg by mouth daily.         Marland Kitchen oxyCODONE-acetaminophen (PERCOCET/ROXICET) 5-325 MG per tablet   Oral   Take 1-2 tablets by mouth every 4 (four) hours as needed for moderate pain.   30 tablet   0   . pravastatin (PRAVACHOL) 10 MG tablet   Oral   Take 10 mg by mouth at bedtime.          Marland Kitchen rOPINIRole (REQUIP) 1 MG tablet   Oral   Take 1-2 mg by mouth at bedtime.         Marland Kitchen terazosin (HYTRIN) 5 MG capsule   Oral   Take 5 mg by mouth daily.            Allergies Review of patient's allergies indicates no known allergies.  No family history on file.  Social History History  Substance Use Topics  . Smoking status: Former Smoker -- 33  years  . Smokeless tobacco: Never Used  . Alcohol Use: No    Review of Systems  Constitutional: Negative for fever. Eyes: Negative for visual changes. ENT: Negative for sore throat Cardiovascular: Negative for chest pain. Respiratory: Negative for shortness of breath. Gastrointestinal: Negative for abdominal pain, vomiting and diarrhea. Genitourinary: Negative for dysuria. Positive for difficulty urinating Musculoskeletal: Negative for back pain. Skin: Negative for rash. Neurological: Negative for headaches or focal weakness Psychiatric: No anxiety  10-point ROS otherwise negative.  ____________________________________________   PHYSICAL EXAM:  VITAL SIGNS: ED Triage Vitals  Enc Vitals Group     BP 02/13/15 1015 122/62 mmHg     Pulse Rate 02/13/15 1015 90     Resp 02/13/15 1015 20     Temp 02/13/15 1015 98.8 F (37.1 C)     Temp Source 02/13/15 1015 Oral     SpO2 02/13/15 1015 94 %     Weight 02/13/15 1015 245 lb (111.131 kg)     Height 02/13/15 1015 5\' 9"  (1.753 m)     Head Cir --      Peak Flow --      Pain Score 02/13/15 1015 8     Pain Loc --      Pain Edu? --       Excl. in [place]? --      Constitutional: Alert and oriented. Well appearing and in no distress. Eyes: Conjunctivae are normal. PERRL. ENT   Head: Normocephalic and atraumatic.   Nose: No rhinnorhea.   Mouth/Throat: Mucous membranes are moist. Cardiovascular: Normal rate, regular rhythm. Normal and symmetric distal pulses are present in all extremities. No murmurs, rubs, or gallops. Respiratory: Normal respiratory effort without tachypnea nor retractions. Breath sounds are clear and equal bilaterally.  Gastrointestinal: Soft and non-tender in all quadrants. There is no CVA tenderness. Abdomen is distended but no tenderness to palpation. Genitourinary: deferred Musculoskeletal: Nontender with normal range of motion in all extremities. No lower extremity tenderness nor edema. Neurologic:  Normal speech and language. No gross focal neurologic deficits are appreciated. Skin:  Skin is warm, dry and intact. No rash noted. Psychiatric: Mood and affect are normal. Patient exhibits appropriate insight and judgment.  ____________________________________________    LABS (pertinent positives/negatives)  Labs Reviewed  CBC - Abnormal; Notable for the following:    WBC 17.1 (*)    RBC 4.00 (*)    Hemoglobin 10.4 (*)    HCT 32.1 (*)    RDW 14.6 (*)    Platelets 556 (*)    All other components within normal limits  COMPREHENSIVE METABOLIC PANEL - Abnormal; Notable for the following:    BUN 53 (*)    Creatinine, Ser 4.08 (*)    Calcium 8.4 (*)    Albumin 2.7 (*)    GFR calc non Af Amer 13 (*)    GFR calc Af Amer 15 (*)    All other components within normal limits  URINALYSIS COMPLETEWITH MICROSCOPIC ([facility] ONLY) - Abnormal; Notable for the following:    Color, Urine YELLOW (*)    APPearance CLEAR (*)    Hgb urine dipstick 1+ (*)    Leukocytes, UA 1+ (*)    Squamous Epithelial / LPF 0-5 (*)    All other components within normal limits     ____________________________________________   EKG  None  ____________________________________________    RADIOLOGY  KUB shows nonobstructive gas pattern  ____________________________________________   PROCEDURES  Procedure(s) performed: yes  Angiocath insertion Performed by: Lavonia Drafts  Consent: Verbal consent obtained. Risks and benefits: risks, benefits and alternatives were discussed Time out: Immediately prior to procedure a "time out" was called to verify the correct patient, procedure, equipment, support staff and site/side marked as required.  Preparation: Patient was prepped and draped in the usual sterile fashion.  Vein Location: left Antecubital fossa   Ultrasound Guided  Gauge: 20  Normal blood return and flush without difficulty Patient tolerance: Patient tolerated the procedure well with no immediate complications.     Critical Care performed: none  ____________________________________________   INITIAL IMPRESSION / ASSESSMENT AND PLAN / ED COURSE  Pertinent labs & imaging results that were available during my care of the patient were reviewed by me and considered in my medical decision making (see chart for details).  Comprehensive metabolic panel consistent with acute renal failure with a limited to the hospitalist service  ____________________________________________   FINAL CLINICAL IMPRESSION(S) / ED DIAGNOSES  Final diagnoses:  Acute renal failure, unspecified acute renal failure type     Lavonia Drafts, MD 02/13/15 1306  Lavonia Drafts, MD 03/02/15 2146

## 2015-02-13 NOTE — Telephone Encounter (Signed)
Please call pt, he is feeling pressure when urinating. Pt has PO appt on 6/9 with ELY

## 2015-02-13 NOTE — ED Notes (Signed)
Patient to US

## 2015-02-13 NOTE — H&P (Addendum)
[facility] NAME: Corey Johnson    MR#:  [id number]  DATE OF BIRTH:  01-03-1941  DATE OF ADMISSION:  02/13/2015  PRIMARY CARE PHYSICIAN: JADALI,FAYEGH, MD   REQUESTING/REFERRING PHYSICIAN: Dr. Corky Downs  CHIEF COMPLAINT:   Chief Complaint  Patient presents with  . Urinary Retention    states has only been able to void small amounts x 5 days    HISTORY OF PRESENT ILLNESS: Corey Johnson  is a 74 y.o. male with a known history of chronic kidney disease stage III, who actually was hospitalized on 02/05/2015 and discharged on 02/08/2015 after he presented with abdominal discomfort. Patient was admitted to the surgical service with acute cholecystitis. He underwent a laparoscopic cholecystectomy and a drain was left. Patient states that on discharge he did have a bowel movement but since being at home he has not had any further bowel movements. He also has had difficulty with urinating and is unable to urinate He also noticed his abdomen to be more distended and has been having suprapubic abdominal pain. He denies any fevers or chills no chest pains.  PAST MEDICAL HISTORY:   Past Medical History  Diagnosis Date  . Arthritis   . Hypertension   . Renal disorder   . Sleep apnea     CPAP  . Gout   . Psoriasis   . Vitamin D deficiency   . BPH (benign prostatic hyperplasia)   . Hyperlipemia   . CRF (chronic renal failure)   . H. pylori infection     PAST SURGICAL HISTORY:  Past Surgical History  Procedure Laterality Date  . Joint replacement    . Cholecystectomy N/A 02/06/2015    Procedure: LAPAROSCOPIC CHOLECYSTECTOMY WITH INTRAOPERATIVE CHOLANGIOGRAM;  Surgeon: Dia Crawford III, MD;  Location: [facility] ORS;  Service: General;  Laterality: N/A;  . Shoulder surgery Right   . Knee arthroscopy    . Transurethral resection of prostate    . Replacement total knee      SOCIAL HISTORY:  History  Substance Use Topics  . Smoking status:  Former Smoker -- 20 years    Types: Cigarettes  . Smokeless tobacco: Never Used  . Alcohol Use: No    FAMILY HISTORY:  Family History  Problem Relation Age of Onset  . Hypertension Mother   . Pneumonia Father     DRUG ALLERGIES: No Known Allergies  REVIEW OF SYSTEMS:   CONSTITUTIONAL: No fever, fatigue or weakness.  EYES: No blurred or double vision.  EARS, NOSE, AND THROAT: No tinnitus or ear pain.  RESPIRATORY: No cough, positive shortness of breath, wheezing or hemoptysis.  CARDIOVASCULAR: No chest pain, orthopnea, edema.  GASTROINTESTINAL: No nausea, vomiting, diarrhea, complains of abdominal distention and suprapubic pain GENITOURINARY: No dysuria, hematuria. Difficulty with voiding ENDOCRINE: No polyuria, nocturia,  HEMATOLOGY: No anemia, easy bruising or bleeding SKIN: No rash or lesion. MUSCULOSKELETAL: No joint pain or arthritis.   NEUROLOGIC: No tingling, numbness, weakness.  PSYCHIATRY: No anxiety or depression.   MEDICATIONS AT HOME:  Prior to Admission medications   Medication Sig Start Date End Date Taking? Authorizing Provider  Cholecalciferol 1000 UNITS tablet Take 1,000 Units by mouth daily.   Yes Historical Provider, MD  cholestyramine Lucrezia Starch) 4 G packet Take 4 g by mouth 3 (three) times daily as needed (for diarrhea).    Yes Historical Provider, MD  colchicine 0.6 MG tablet Take 0.6 mg by mouth daily as needed (for gout).  Yes Historical Provider, MD  diltiazem (CARDIZEM CD) 180 MG 24 hr capsule Take 180 mg by mouth daily.   Yes Historical Provider, MD  enalapril (VASOTEC) 20 MG tablet Take 20 mg by mouth daily.   Yes Historical Provider, MD  febuxostat (ULORIC) 40 MG tablet Take 40 mg by mouth daily.   Yes Historical Provider, MD  hydrochlorothiazide (HYDRODIURIL) 25 MG tablet Take 25 mg by mouth daily.   Yes Historical Provider, MD  omeprazole (PRILOSEC) 40 MG capsule Take 40 mg by mouth daily.   Yes Historical Provider, MD  oxyCODONE-acetaminophen  (PERCOCET/ROXICET) 5-325 MG per tablet Take 1-2 tablets by mouth every 4 (four) hours as needed for moderate pain. 02/08/15  Yes Dia Crawford III, MD  pravastatin (PRAVACHOL) 10 MG tablet Take 10 mg by mouth at bedtime.    Yes Historical Provider, MD  rOPINIRole (REQUIP) 1 MG tablet Take 1-2 mg by mouth at bedtime.   Yes Historical Provider, MD  terazosin (HYTRIN) 5 MG capsule Take 5 mg by mouth daily.    Yes Historical Provider, MD      PHYSICAL EXAMINATION:   VITAL SIGNS: Blood pressure 136/82, pulse 83, temperature 98.8 F (37.1 C), temperature source Oral, resp. rate 20, height 5\' 9"  (1.753 m), weight 111.131 kg (245 lb), SpO2 91 %.  GENERAL:  74 y.o.-year-old morbidly obese male  lying in the bed with no acute distress.  EYES: Pupils equal, round, reactive to light and accommodation. No scleral icterus. Extraocular muscles intact.  HEENT: Head atraumatic, normocephalic. Oropharynx and nasopharynx clear.  NECK:  Supple, no jugular venous distention. No thyroid enlargement, no tenderness.  LUNGS: Normal breath sounds bilaterally, no wheezing, rales,rhonchi or crepitation. No use of accessory muscles of respiration.  CARDIOVASCULAR: S1, S2 normal. No murmurs, rubs, or gallops.  ABDOMEN: Distended Bowel sounds diminished. No organomegaly or mass. Suprapubic tenderness EXTREMITIES: No pedal edema, cyanosis, or clubbing.  NEUROLOGIC: Cranial nerves II through XII are intact. Muscle strength 5/5 in all extremities. Sensation intact. Gait not checked.  PSYCHIATRIC: The patient is alert and oriented x 3.  SKIN: No obvious rash, lesion, or ulcer.   LABORATORY PANEL:   CBC  Recent Labs Lab 02/07/15 0530 02/13/15 1140  WBC 19.9* 17.1*  HGB 9.6* 10.4*  HCT 29.2* 32.1*  PLT 245 556*  MCV 79.2* 80.3  MCH 25.9* 26.0  MCHC 32.7 32.4  RDW 14.6* 14.6*   ------------------------------------------------------------------------------------------------------------------  Chemistries   Recent  Labs Lab 02/07/15 0530 02/08/15 0449 02/13/15 1140  NA 135 137 141  K 4.2 4.0 4.3  CL 101 105 101  CO2 25 26 27   GLUCOSE 175* 172* 98  BUN 30* 32* 53*  CREATININE 1.61* 1.45* 4.08*  CALCIUM 7.5* 7.6* 8.4*  AST 31  --  24  ALT 23  --  29  ALKPHOS 57  --  70  BILITOT 0.3  --  0.4   ------------------------------------------------------------------------------------------------------------------ estimated creatinine clearance is 18.9 mL/min (by C-G formula based on Cr of 4.08). ------------------------------------------------------------------------------------------------------------------ No results for input(s): TSH, T4TOTAL, T3FREE, THYROIDAB in the last 72 hours.  Invalid input(s): FREET3   Coagulation profile No results for input(s): INR, PROTIME in the last 168 hours. ------------------------------------------------------------------------------------------------------------------- No results for input(s): DDIMER in the last 72 hours. -------------------------------------------------------------------------------------------------------------------  Cardiac Enzymes No results for input(s): CKMB, TROPONINI, MYOGLOBIN in the last 168 hours.  Invalid input(s): CK ------------------------------------------------------------------------------------------------------------------ Invalid input(s): POCBNP  ---------------------------------------------------------------------------------------------------------------  Urinalysis    Component Value Date/Time   COLORURINE YELLOW* 02/13/2015 1139   APPEARANCEUR  CLEAR* 02/13/2015 1139   LABSPEC 1.009 02/13/2015 1139   PHURINE 5.0 02/13/2015 1139   GLUCOSEU NEGATIVE 02/13/2015 1139   HGBUR 1+* 02/13/2015 1139   BILIRUBINUR NEGATIVE 02/13/2015 1139   KETONESUR NEGATIVE 02/13/2015 1139   PROTEINUR NEGATIVE 02/13/2015 1139   NITRITE NEGATIVE 02/13/2015 1139   LEUKOCYTESUR 1+* 02/13/2015 1139     RADIOLOGY: Dg Abd 1  View  02/13/2015   CLINICAL DATA:  Gallbladder surgery 1 week ago with urinary retention.  EXAM: ABDOMEN - 1 VIEW  COMPARISON:  Abdominal CT 02/05/2015  FINDINGS: Normal bowel gas pattern. Previously administered oral contrast is still visible in the nondilated colon. There are changes of cholecystectomy, which was recent. No concerning intra-abdominal mass effect or calcification. The bladder shadow is visible, bladder volume may be better estimated by sonography.  IMPRESSION: 1. Nonobstructive bowel gas pattern. 2. Visible bladder shadow. Bladder volume would be better estimated by sonography.   Electronically Signed   By: Monte Fantasia M.D.   On: 02/13/2015 11:38    EKG: Orders placed or performed during the hospital encounter of 02/05/15  . EKG 12-Lead  . EKG 12-Lead  . EKG    IMPRESSION AND PLAN: 1. Acute renal failure on chronic renal failure: Patient's creatinine on discharge during recent hospitalization was 1.45 now 4.08. At this time will hold his HCTZ as well as enalapril. I will get a bladder ultrasound. Also renal ultrasound asked nephrology to see the patient. Give him IV fluids monitor his renal function avoid nephrotoxic agents. Continue  Hytrin,  add Flomax 2. Abdominal distention recent surgery patient has significant abdominal distention no bowel movement leukocytosis, recent abdominal surgery I will go ahead and get a CT scan of his abdomen with by mouth contrast. Surgery evaluation  3. Leukocytosis: CT of the abdomen  4. Hypertension: Continue Cardizem his HCTZ and enalapril will be on hold. . 5. GoutContinue Rx and colchicine  6. HyperlipidemiaContinue pravastatin  7. Miscellaneous: Heparin for DVT prophylaxis.   All the records are reviewed and case discussed with ED provider. Management plans discussed with the patient, family and they are in agreement.  CODE STATUS:    TOTAL TIME TAKING CARE OF THIS PATIENT: 55 minutes.    Dustin Flock M.D on 02/13/2015 at  2:12 PM  Between 7am to 6pm - Pager - [phone]  After 6pm go to [email or web address] - password [email or web address] [place] [facility]  Office  [phone]  CC: Primary care physician; [facility], MD

## 2015-02-13 NOTE — ED Notes (Signed)
Bladder scan shows 148ml.

## 2015-02-13 NOTE — Consult Note (Signed)
Patient ID: Corey Johnson, male   DOB: [birth year], 74 y.o.   MRN: [id number]    HPI  Corey Johnson is a 74 y.o. male.  who underwent a laparoscopic cholecystectomy for acute calculus cholecystitis by Dr. Pat Patrick on 02/06/2015.  He was discharged home on July 1. Since discharge the patient has had difficulty urinating with sensation of a full bladder as well as significant constipation distention and abdominal pain. He denies any nausea vomiting fever or jaundice. A Jackson-Pratt drain was removed prior to his discharge from the hospital. Workup in the emergency room demonstrates an elevated WBC count and a CT scan demonstrating constipation. There is no signs of bowel obstruction. The right upper quadrant is unremarkable except for recently placed hemoclips.   he was found to be in acute renal failure with a creatinine of 4. Due to his recent surgical procedure the patient's medical physician requested surgical services consult.  Again the patient during my interview had voided just a small amount in the urinal and felt like his bladder was full. A bladder scan will be requested from nursing.   HPI  Past Medical History  Diagnosis Date  . Arthritis   . Hypertension   . Renal disorder   . Sleep apnea     CPAP  . Gout   . Psoriasis   . Vitamin D deficiency   . BPH (benign prostatic hyperplasia)   . Hyperlipemia   . CRF (chronic renal failure)   . H. pylori infection     Past Surgical History  Procedure Laterality Date  . Joint replacement    . Cholecystectomy N/A 02/06/2015    Procedure: LAPAROSCOPIC CHOLECYSTECTOMY WITH INTRAOPERATIVE CHOLANGIOGRAM;  Surgeon: Dia Crawford III, MD;  Location: [facility] ORS;  Service: General;  Laterality: N/A;  . Shoulder surgery Right   . Knee arthroscopy    . Transurethral resection of prostate    . Replacement total knee      Family History  Problem Relation Age of Onset  . Hypertension Mother   . Pneumonia Father     Social  History History  Substance Use Topics  . Smoking status: Former Smoker -- 20 years    Types: Cigarettes  . Smokeless tobacco: Never Used  . Alcohol Use: No    No Known Allergies  Current Facility-Administered Medications  Medication Dose Route Frequency Provider Last Rate Last Dose  . 0.9 %  sodium chloride infusion   Intravenous Continuous Dustin Flock, MD 100 mL/hr at 02/13/15 1839    . acetaminophen (TYLENOL) tablet 650 mg  650 mg Oral Q6H PRN Dustin Flock, MD       Or  . acetaminophen (TYLENOL) suppository 650 mg  650 mg Rectal Q6H PRN Dustin Flock, MD      . alum & mag hydroxide-simeth (MAALOX/MYLANTA) 200-200-20 MG/5ML suspension 30 mL  30 mL Oral Q6H PRN Dustin Flock, MD   30 mL at 02/13/15 1840  . Cholecalciferol 1,000 Units  1,000 Units Oral Daily Dustin Flock, MD   1,000 Units at 02/13/15 1730  . cholestyramine (QUESTRAN) packet 4 g  4 g Oral TID PRN Dustin Flock, MD      . colchicine tablet 0.6 mg  0.6 mg Oral Daily PRN Dustin Flock, MD      . diltiazem (CARDIZEM CD) 24 hr capsule 180 mg  180 mg Oral Daily Dustin Flock, MD   180 mg at 02/13/15 1839  . febuxostat (ULORIC) tablet 40 mg  40 mg Oral Daily Shreyang  Posey Pronto, MD   40 mg at 02/13/15 1730  . heparin injection 5,000 Units  5,000 Units Subcutaneous 3 times per day Dustin Flock, MD   5,000 Units at 02/13/15 1730  . ondansetron (ZOFRAN) tablet 4 mg  4 mg Oral Q6H PRN Dustin Flock, MD       Or  . ondansetron (ZOFRAN) injection 4 mg  4 mg Intravenous Q6H PRN Dustin Flock, MD      . oxyCODONE-acetaminophen (PERCOCET/ROXICET) 5-325 MG per tablet 1-2 tablet  1-2 tablet Oral Q4H PRN Dustin Flock, MD   2 tablet at 02/13/15 1840  . pantoprazole (PROTONIX) EC tablet 40 mg  40 mg Oral Daily Dustin Flock, MD   40 mg at 02/13/15 1839  . pravastatin (PRAVACHOL) tablet 10 mg  10 mg Oral QHS Dustin Flock, MD      . rOPINIRole (REQUIP) tablet 1-2 mg  1-2 mg Oral QHS Dustin Flock, MD      . terazosin (HYTRIN)  capsule 5 mg  5 mg Oral Daily Dustin Flock, MD   5 mg at 02/13/15 1840      Review of Systems A 10 point review of systems was asked and was negative except for the following positive findings as described above in the history of present illness.  Blood pressure 156/74, pulse 93, temperature 98.4 F (36.9 C), temperature source Oral, resp. rate 20, height 5\' 9"  (1.753 m), weight 111.131 kg (245 lb), SpO2 93 %.  Physical Exam CONSTITUTIONAL:  Pleasant, well-developed, well-nourished, and in no acute distress. EYES: Pupils equal and reactive to light, Sclera non-icteric EARS, NOSE, MOUTH AND THROAT:  The oropharynx was clear.  Dentition is good repair.  Oral mucosa pink and moist. LYMPH NODES:  Lymph nodes in the neck and axillae were normal RESPIRATORY:  Lungs were clear.  Normal respiratory effort without pathologic use of accessory muscles of respiration CARDIOVASCULAR: Heart was regular without murmurs.  There were no carotid bruits. GI: The abdomen was soft, nontender, and distended. There were no palpable masses. There was no hepatosplenomegaly. There were normal bowel sounds in all quadrants.  patients are healing nicely and sutures were in place. There is no drainage. There is no ecchymosis. There is no signs of infection. There are no peritoneal signs.  GU:  Rectal deferred.   MUSCULOSKELETAL:  Normal muscle strength and tone.  No clubbing or cyanosis.   SKIN:  There were no pathologic skin lesions.  There were no nodules on palpation. NEUROLOGIC:  Sensation is normal.  Cranial nerves are grossly intact. PSYCH:  Oriented to person, place and time.  Mood and affect are normal.  Data Reviewed I personally reviewed the CT scan images and agree with their interpretation.  I have personally reviewed the patient's imaging, laboratory findings and medical records.    Assessment    15 showed male approximately one week status post laparoscopic cholecystectomy admitted with acute  renal failure constipation. I see no signs of surgical complication to explain any of this.    Plan    I agree with your plan. Hydration I have asked the nursing staff to obtain a bladder scan and if there is a large amount of urine in his bladder insert a Foley catheter. We will follow the patient with you. Thank you for the consult.  Review of his pathology demonstrates. Cholelithiasis with acute gangrenous cholecystitis.     Sherri Rad 02/13/2015, 8:26 PM

## 2015-02-14 DIAGNOSIS — R339 Retention of urine, unspecified: Secondary | ICD-10-CM

## 2015-02-14 DIAGNOSIS — N179 Acute kidney failure, unspecified: Secondary | ICD-10-CM

## 2015-02-14 DIAGNOSIS — R140 Abdominal distension (gaseous): Secondary | ICD-10-CM

## 2015-02-14 DIAGNOSIS — R944 Abnormal results of kidney function studies: Secondary | ICD-10-CM

## 2015-02-14 LAB — CBC
HCT: 28.8 % — ABNORMAL LOW (ref 40.0–52.0)
Hemoglobin: 9.3 g/dL — ABNORMAL LOW (ref 13.0–18.0)
MCH: 25.7 pg — ABNORMAL LOW (ref 26.0–34.0)
MCHC: 32.3 g/dL (ref 32.0–36.0)
MCV: 79.5 fL — AB (ref 80.0–100.0)
Platelets: 481 10*3/uL — ABNORMAL HIGH (ref 150–440)
RBC: 3.62 MIL/uL — AB (ref 4.40–5.90)
RDW: 14.5 % (ref 11.5–14.5)
WBC: 14.3 10*3/uL — ABNORMAL HIGH (ref 3.8–10.6)

## 2015-02-14 LAB — BASIC METABOLIC PANEL
Anion gap: 11 (ref 5–15)
BUN: 46 mg/dL — ABNORMAL HIGH (ref 6–20)
CHLORIDE: 105 mmol/L (ref 101–111)
CO2: 25 mmol/L (ref 22–32)
CREATININE: 3.55 mg/dL — AB (ref 0.61–1.24)
Calcium: 7.9 mg/dL — ABNORMAL LOW (ref 8.9–10.3)
GFR, EST AFRICAN AMERICAN: 18 mL/min — AB (ref >60)
GFR, EST NON AFRICAN AMERICAN: 15 mL/min — AB (ref >60)
Glucose, Bld: 100 mg/dL — ABNORMAL HIGH (ref 65–99)
Potassium: 4.1 mmol/L (ref 3.5–5.1)
SODIUM: 141 mmol/L (ref 135–145)

## 2015-02-14 MED ORDER — TERAZOSIN HCL 5 MG PO CAPS
10.0000 mg | ORAL_CAPSULE | Freq: Every day | ORAL | Status: DC
  Administered 2015-02-15 – 2015-02-17 (×3): 10 mg via ORAL
  Filled 2015-02-14 (×3): qty 2

## 2015-02-14 NOTE — Progress Notes (Signed)
Pt was bladder scanned at 2200 and 997 was in bladder. Orders to place foley if greater than 300. Foley placed and 1400 immediately returned. Pt states he felt better with less pressure.

## 2015-02-14 NOTE — Consult Note (Signed)
Urology Consult  Referring physician: A Gouru Reason for referral: Urine retention  Chief Complaint: Urine retention  History of Present Illness: Elderly male with CRI; recent cholecystis admission; had surgery and post op constipation; went into urine retention; Cr last admission was 1.45 and 1.27; was 4.08 this admission; flomax added; had distended abdomen; assessed by General surgery and Nephrology;  Recent renal u/sound: no hydro and renal atrophy Recent CT scan: overdistended bladder and upper ureters mild hydro Past ? Thermotherapy 10 years ago; no active urologist Baseline Nocturia x2 and moderate good flow NO UTI Modifying factors: There are no other modifying factors  Associated signs and symptoms: There are no other associated signs and symptoms Aggravating and relieving factors: There are no other aggravating or relieving factors Severity: Moderate Duration: Persistent Past Medical History  Diagnosis Date  . Arthritis   . Hypertension   . Renal disorder   . Sleep apnea     CPAP  . Gout   . Psoriasis   . Vitamin D deficiency   . BPH (benign prostatic hyperplasia)   . Hyperlipemia   . CRF (chronic renal failure)   . H. pylori infection    Past Surgical History  Procedure Laterality Date  . Joint replacement    . Cholecystectomy N/A 02/06/2015    Procedure: LAPAROSCOPIC CHOLECYSTECTOMY WITH INTRAOPERATIVE CHOLANGIOGRAM;  Surgeon: Dia Crawford III, MD;  Location: [facility] ORS;  Service: General;  Laterality: N/A;  . Shoulder surgery Right   . Knee arthroscopy    . Transurethral resection of prostate    . Replacement total knee      Medications: I have reviewed the patient's current medications. Allergies: No Known Allergies  Family History  Problem Relation Age of Onset  . Hypertension Mother   . Pneumonia Father    Social History:  reports that he has quit smoking. His smoking use included Cigarettes. He quit after 20 years of use. He has never used smokeless  tobacco. He reports that he does not drink alcohol or use illicit drugs.  ROS: All systems are reviewed and negative except as noted. Rest negative  Physical Exam:  Vital signs in last 24 hours: Temp:  [98.1 F (36.7 C)-99.4 F (37.4 C)] 98.1 F (36.7 C) (06/07 0850) Pulse Rate:  [71-93] 77 (06/07 0850) Resp:  [18] 18 (06/07 0850) BP: (125-159)/(66-83) 152/66 mmHg (06/07 0850) SpO2:  [93 %-98 %] 96 % (06/07 0850)  Cardiovascular: Skin warm; not flushed Respiratory: Breaths quiet; no shortness of breath Abdomen: No masses Neurological: Normal sensation to touch Musculoskeletal: Normal motor function arms and legs Lymphatics: No inguinal adenopathy Skin: No rashes Genitourinary:foley in place; genitalia normal  Laboratory Data:  Results for orders placed or performed during the hospital encounter of 02/13/15 (from the past 72 hour(s))  Urinalysis complete, with microscopic ([facility] only)     Status: Abnormal   Collection Time: 02/13/15 11:39 AM  Result Value Ref Range   Color, Urine YELLOW (A) YELLOW   APPearance CLEAR (A) CLEAR   Glucose, UA NEGATIVE NEGATIVE mg/dL   Bilirubin Urine NEGATIVE NEGATIVE   Ketones, ur NEGATIVE NEGATIVE mg/dL   Specific Gravity, Urine 1.009 1.005 - 1.030   Hgb urine dipstick 1+ (A) NEGATIVE   pH 5.0 5.0 - 8.0   Protein, ur NEGATIVE NEGATIVE mg/dL   Nitrite NEGATIVE NEGATIVE   Leukocytes, UA 1+ (A) NEGATIVE   RBC / HPF 0-5 0 - 5 RBC/hpf   WBC, UA 6-30 0 - 5 WBC/hpf   Bacteria, UA  NONE SEEN NONE SEEN   Squamous Epithelial / LPF 0-5 (A) NONE SEEN   Amorphous Crystal PRESENT   CBC     Status: Abnormal   Collection Time: 02/13/15 11:40 AM  Result Value Ref Range   WBC 17.1 (H) 3.8 - 10.6 K/uL   RBC 4.00 (L) 4.40 - 5.90 MIL/uL   Hemoglobin 10.4 (L) 13.0 - 18.0 g/dL   HCT 32.1 (L) 40.0 - 52.0 %   MCV 80.3 80.0 - 100.0 fL   MCH 26.0 26.0 - 34.0 pg   MCHC 32.4 32.0 - 36.0 g/dL   RDW 14.6 (H) 11.5 - 14.5 %   Platelets 556 (H) 150 - 440 K/uL   Comprehensive metabolic panel     Status: Abnormal   Collection Time: 02/13/15 11:40 AM  Result Value Ref Range   Sodium 141 135 - 145 mmol/L   Potassium 4.3 3.5 - 5.1 mmol/L   Chloride 101 101 - 111 mmol/L   CO2 27 22 - 32 mmol/L   Glucose, Bld 98 65 - 99 mg/dL   BUN 53 (H) 6 - 20 mg/dL   Creatinine, Ser 4.08 (H) 0.61 - 1.24 mg/dL   Calcium 8.4 (L) 8.9 - 10.3 mg/dL   Total Protein 7.1 6.5 - 8.1 g/dL   Albumin 2.7 (L) 3.5 - 5.0 g/dL   AST 24 15 - 41 U/L   ALT 29 17 - 63 U/L   Alkaline Phosphatase 70 38 - 126 U/L   Total Bilirubin 0.4 0.3 - 1.2 mg/dL   GFR calc non Af Amer 13 (L) >60 mL/min   GFR calc Af Amer 15 (L) >60 mL/min    Comment: (NOTE) The eGFR has been calculated using the CKD EPI equation. This calculation has not been validated in all clinical situations. eGFR's persistently <60 mL/min signify possible Chronic Kidney Disease.    Anion gap 13 5 - 15  Basic metabolic panel     Status: Abnormal   Collection Time: 02/14/15  5:50 AM  Result Value Ref Range   Sodium 141 135 - 145 mmol/L   Potassium 4.1 3.5 - 5.1 mmol/L   Chloride 105 101 - 111 mmol/L   CO2 25 22 - 32 mmol/L   Glucose, Bld 100 (H) 65 - 99 mg/dL   BUN 46 (H) 6 - 20 mg/dL   Creatinine, Ser 3.55 (H) 0.61 - 1.24 mg/dL   Calcium 7.9 (L) 8.9 - 10.3 mg/dL   GFR calc non Af Amer 15 (L) >60 mL/min   GFR calc Af Amer 18 (L) >60 mL/min    Comment: (NOTE) The eGFR has been calculated using the CKD EPI equation. This calculation has not been validated in all clinical situations. eGFR's persistently <60 mL/min signify possible Chronic Kidney Disease.    Anion gap 11 5 - 15  CBC     Status: Abnormal   Collection Time: 02/14/15  7:04 AM  Result Value Ref Range   WBC 14.3 (H) 3.8 - 10.6 K/uL   RBC 3.62 (L) 4.40 - 5.90 MIL/uL   Hemoglobin 9.3 (L) 13.0 - 18.0 g/dL   HCT 28.8 (L) 40.0 - 52.0 %   MCV 79.5 (L) 80.0 - 100.0 fL   MCH 25.7 (L) 26.0 - 34.0 pg   MCHC 32.3 32.0 - 36.0 g/dL   RDW 14.5 11.5 - 14.5 %    Platelets 481 (H) 150 - 440 K/uL   Recent Results (from the past 240 hour(s))  Blood culture (routine x 2)  Status: None   Collection Time: 02/05/15  7:42 PM  Result Value Ref Range Status   Specimen Description BLOOD  Final   Special Requests BLOOD LEFT ARM  Final   Culture NO GROWTH 5 DAYS  Final   Report Status 02/10/2015 FINAL  Final  Culture, blood (routine x 2)     Status: None   Collection Time: 02/05/15  7:44 PM  Result Value Ref Range Status   Specimen Description BLOOD  Final   Special Requests BLOOD RIGHT HAND  Final   Culture NO GROWTH 5 DAYS  Final   Report Status 02/10/2015 FINAL  Final  Urine culture     Status: None   Collection Time: 02/06/15  1:10 AM  Result Value Ref Range Status   Specimen Description URINE, CLEAN CATCH  Final   Special Requests NONE  Final   Culture NO GROWTH 1 DAY  Final   Report Status 02/07/2015 FINAL  Final   Creatinine:  Recent Labs  02/08/15 0449 02/13/15 1140 02/14/15 0550  CREATININE 1.45* 4.08* 3.55*    Xrays: See report/chart See above  Impression/Assessment:  Retention likely from constipation- BM today normal Minimal hydro likely from full bladder  Plan:  Make sure Cr normalizes Keep BM regular Keep pt on home flomax Give trial of voiding before send home or with Dr Erlene Quan as outpt Will sign over to Dr Jeb Levering prognosis but need f/up with Urology to make sure he empties well chronically  Hashem Goynes A 02/14/2015, 4:40 PM

## 2015-02-14 NOTE — Progress Notes (Signed)
[place] [facility] [place] NAME: Corey Johnson    MR#:  [id number]  DATE OF BIRTH:  July 07, 1941  SUBJECTIVE:  CHIEF COMPLAINT:  Feeling better  REVIEW OF SYSTEMS:  CONSTITUTIONAL: No fever, fatigue or weakness.  EYES: No blurred or double vision.  EARS, NOSE, AND THROAT: No tinnitus or ear pain.  RESPIRATORY: No cough, shortness of breath, wheezing or hemoptysis.  CARDIOVASCULAR: No chest pain, orthopnea, edema.  GASTROINTESTINAL: No nausea, vomiting, diarrhea or abdominal pain.  GENITOURINARY: No dysuria, hematuria.  ENDOCRINE: No polyuria, nocturia,  HEMATOLOGY: No anemia, easy bruising or bleeding SKIN: No rash or lesion. MUSCULOSKELETAL: No joint pain or arthritis.   NEUROLOGIC: No tingling, numbness, weakness.  PSYCHIATRY: No anxiety or depression.   DRUG ALLERGIES:  No Known Allergies  VITALS:  Blood pressure 158/73, pulse 74, temperature 98.8 F (37.1 C), temperature source Oral, resp. rate 18, height 5\' 9"  (1.753 m), weight 111.131 kg (245 lb), SpO2 93 %.  PHYSICAL EXAMINATION:  GENERAL:  74 y.o.-year-old patient lying in the bed with no acute distress.  EYES: Pupils equal, round, reactive to light and accommodation. No scleral icterus. Extraocular muscles intact.  HEENT: Head atraumatic, normocephalic. Oropharynx and nasopharynx clear.  NECK:  Supple, no jugular venous distention. No thyroid enlargement, no tenderness.  LUNGS: Normal breath sounds bilaterally, no wheezing, rales,rhonchi or crepitation. No use of accessory muscles of respiration.  CARDIOVASCULAR: S1, S2 normal. No murmurs, rubs, or gallops.  ABDOMEN: Soft, nontender, nondistended. Bowel sounds present. No organomegaly or mass.  EXTREMITIES: No pedal edema, cyanosis, or clubbing.  NEUROLOGIC: Cranial nerves II through XII are intact. Muscle strength 5/5 in all extremities. Sensation intact. Gait not checked.  PSYCHIATRIC: The patient is alert and oriented x 3.   SKIN: No obvious rash, lesion, or ulcer.    LABORATORY PANEL:   CBC  Recent Labs Lab 02/14/15 0704  WBC 14.3*  HGB 9.3*  HCT 28.8*  PLT 481*   ------------------------------------------------------------------------------------------------------------------  Chemistries   Recent Labs Lab 02/13/15 1140 02/14/15 0550  NA 141 141  K 4.3 4.1  CL 101 105  CO2 27 25  GLUCOSE 98 100*  BUN 53* 46*  CREATININE 4.08* 3.55*  CALCIUM 8.4* 7.9*  AST 24  --   ALT 29  --   ALKPHOS 70  --   BILITOT 0.4  --    ------------------------------------------------------------------------------------------------------------------  Cardiac Enzymes No results for input(s): TROPONINI in the last 168 hours. ------------------------------------------------------------------------------------------------------------------  RADIOLOGY:  Ct Abdomen Pelvis Wo Contrast  02/13/2015   CLINICAL DATA:  Abdominal distension. Cholecystectomy 6 days ago. Constipation, leukocytosis, and dysuria.  EXAM: CT ABDOMEN AND PELVIS WITHOUT CONTRAST  TECHNIQUE: Multidetector CT imaging of the abdomen and pelvis was performed following the standard protocol without IV contrast.  COMPARISON:  02/05/2015  FINDINGS: 4 mm nodule in the right middle lobe is unchanged (series 4, image 7). 3 mm right lower lobe subpleural nodule is also unchanged (series 4, image 7). Subsegmental atelectasis is noted in both lung bases. Three-vessel coronary artery calcification is noted.  Sequelae of interval cholecystectomy are identified with minimal stranding/soft tissue thickening in the gallbladder fossa. No gallbladder fossa fluid collection is seen. The liver, spleen, and pancreas are unremarkable. Minimal bilateral adrenal gland thickening is unchanged. Bilateral renal atrophy is again seen with unchanged low-density renal lesions bilaterally compatible with cysts. There is mild bilateral hydronephrosis. There is mild bilateral  perinephric stranding which extends inferiorly in the retroperitoneum, greater on the left  particularly adjacent to the left ureter and increased from the prior study.  A small amount of contrast is noted in the distal esophagus, possibly representing reflux. Oral contrast is present in multiple nondilated loops of small bowel. There is no evidence of bowel obstruction. There is also some residual oral contrast in the colon. The appendix is unremarkable. There is prominent distention of the bladder.  Moderate aortic atherosclerosis is noted. Mild postsurgical changes are noted in the midline of the lower abdomen. The prostate is mildly enlarged. No free fluid or enlarged lymph nodes are identified. Thoracolumbar spondylosis is noted. There is grade 1 anterolisthesis of L5 on S1 which appears facet mediated.  IMPRESSION: 1. Mild bilateral hydronephrosis likely secondary to prominent bladder distention. Mildly increased retroperitoneal stranding, particularly adjacent to the inferior left kidney and about the left ureter, nonspecific however upper tract infection is a consideration. 2. Interval cholecystectomy.   Electronically Signed   By: Logan Bores   On: 02/13/2015 16:36   Dg Abd 1 View  02/13/2015   CLINICAL DATA:  Gallbladder surgery 1 week ago with urinary retention.  EXAM: ABDOMEN - 1 VIEW  COMPARISON:  Abdominal CT 02/05/2015  FINDINGS: Normal bowel gas pattern. Previously administered oral contrast is still visible in the nondilated colon. There are changes of cholecystectomy, which was recent. No concerning intra-abdominal mass effect or calcification. The bladder shadow is visible, bladder volume may be better estimated by sonography.  IMPRESSION: 1. Nonobstructive bowel gas pattern. 2. Visible bladder shadow. Bladder volume would be better estimated by sonography.   Electronically Signed   By: Monte Fantasia M.D.   On: 02/13/2015 11:38   US Renal  02/13/2015   CLINICAL DATA:  Acute renal failure.   EXAM: RENAL / URINARY TRACT ULTRASOUND COMPLETE  COMPARISON:  CT abdomen and pelvis 02/05/2015. Renal ultrasound 07/23/2010.  FINDINGS: Right Kidney:  Length: 13.9 cm. Cortical thinning. Several cysts were noted, the largest measuring 4.0 cm in the interpolar region/ lower pole. No definite solid mass or hydronephrosis.  Left Kidney:  Length: 15.0 cm. Cortical thinning. Two cysts were noted, the larger measuring 3.0 cm in the interpolar kidney. No hydronephrosis.  Bladder:  Appears normal for degree of bladder distention. Prevoid volume calculated at 1008 cc. Patient was unable to void.  IMPRESSION: 1. Bilateral renal cortical thinning and cysts.  No hydronephrosis. 2. Bladder volume as above.  Patient unable to void.   Electronically Signed   By: Logan Bores   On: 02/13/2015 15:53    EKG:   Orders placed or performed during the hospital encounter of 02/05/15  . EKG 12-Lead  . EKG 12-Lead  . EKG    ASSESSMENT AND PLAN:   #. Acute renal failure on chronic renal failure- prerenal / post renal with mild hydronephrosis 2/2 constipation  baseline cr was 1.45 now 4.08. IVF Laxatives hold his HCTZ as well as enalapril.   Flomax, foley cath F/u with nephro and uro  #. Abdominal distention recent surgery patient has significant abdominal distention no bowel movement leukocytosis, recent abdominal surgery Surgery evaluation    #. Hypertension: elevated bp , Continue Cardizem , titrate prn  his HCTZ and enalapril will be on hold. .  #. GoutContinue Rx and colchicine   #. HyperlipidemiaContinue pravastatin   7. Miscellaneous: Heparin for DVT prophylaxis    All the records are reviewed and case discussed with Care Management/Social Workerr. Management plans discussed with the patient, family and they are in agreement.  CODE STATUS: full  TOTAL TIME TAKING CARE OF THIS PATIENT: 35 minutes.   POSSIBLE D/C IN 2-3 DAYS, DEPENDING ON CLINICAL CONDITION.   Nicholes Mango M.D on 02/14/2015  at 9:40 PM  Between 7am to 6pm - Pager - [phone] After 6pm go to [email or web address] - password [email or web address] [facility]  Office  [phone]  CC: Primary care physician; [facility], MD

## 2015-02-14 NOTE — Progress Notes (Addendum)
Patient A/O no noted distress. Denies pain. Incisions healing well, no noted drainage or signs of infection. Tolerated meds well. Administered Cpap patient is tolerating and sleeping well. Staff will continue to monitor and meet needs. Minimal assist to bathroom.

## 2015-02-14 NOTE — Progress Notes (Signed)
[facility]  ROUNDING NOTE   Subjective:   Corey Johnson had lap chole by Dr. Pat Patrick on 5/30. He was doing well post op but then was having severe constipation and inability to urinate. He states this was going on before his surgery.  Admitted with a creatinine of 4.08 from baseline creatinine of 1.27 with eGFR of 52.  Last seen by Dr. Holley Raring on 10/13/2014  Objective:  Vital signs in last 24 hours:  Temp:  [98.1 F (36.7 C)-99.4 F (37.4 C)] 98.1 F (36.7 C) (06/07 0850) Pulse Rate:  [71-93] 77 (06/07 0850) Resp:  [18] 18 (06/07 0850) BP: (125-166)/(66-85) 152/66 mmHg (06/07 0850) SpO2:  [91 %-98 %] 96 % (06/07 0850)  Weight change:  Filed Weights   02/13/15 1015  Weight: 111.131 kg (245 lb)    Intake/Output: I/O last 3 completed shifts: In: 995 [I.V.:995] Out: 4175 [Urine:4175]   Intake/Output this shift:  Total I/O In: 163 [I.V.:163] Out: -   Physical Exam: General: NAD,   Head: Normocephalic, atraumatic. Moist oral mucosal membranes  Eyes: Anicteric, PERRL  Neck: Supple, trachea midline  Lungs:  Clear to auscultation  Heart: Regular rate and rhythm  Abdomen:  +distended,  +bowel sounds  Extremities: no peripheral edema.  Neurologic: Nonfocal, moving all four extremities  Skin: No lesions       Basic Metabolic Panel:  Recent Labs Lab 02/08/15 0449 02/13/15 1140 02/14/15 0550  NA 137 141 141  K 4.0 4.3 4.1  CL 105 101 105  CO2 '26 27 25  ' GLUCOSE 172* 98 100*  BUN 32* 53* 46*  CREATININE 1.45* 4.08* 3.55*  CALCIUM 7.6* 8.4* 7.9*    Liver Function Tests:  Recent Labs Lab 02/13/15 1140  AST 24  ALT 29  ALKPHOS 70  BILITOT 0.4  PROT 7.1  ALBUMIN 2.7*   No results for input(s): LIPASE, AMYLASE in the last 168 hours. No results for input(s): AMMONIA in the last 168 hours.  CBC:  Recent Labs Lab 02/13/15 1140 02/14/15 0704  WBC 17.1* 14.3*  HGB 10.4* 9.3*  HCT 32.1* 28.8*  MCV 80.3 79.5*  PLT 556* 481*    Cardiac  Enzymes: No results for input(s): CKTOTAL, CKMB, CKMBINDEX, TROPONINI in the last 168 hours.  BNP: Invalid input(s): POCBNP  CBG:  Recent Labs Lab 02/07/15 1739 02/07/15 2029 02/08/15 0025 02/08/15 0406 02/08/15 0805  GLUCAP 142* 141* 150* 181* 114*    Microbiology: Results for orders placed or performed during the hospital encounter of 02/05/15  Blood culture (routine x 2)     Status: None   Collection Time: 02/05/15  7:42 PM  Result Value Ref Range Status   Specimen Description BLOOD  Final   Special Requests BLOOD LEFT ARM  Final   Culture NO GROWTH 5 DAYS  Final   Report Status 02/10/2015 FINAL  Final  Culture, blood (routine x 2)     Status: None   Collection Time: 02/05/15  7:44 PM  Result Value Ref Range Status   Specimen Description BLOOD  Final   Special Requests BLOOD RIGHT HAND  Final   Culture NO GROWTH 5 DAYS  Final   Report Status 02/10/2015 FINAL  Final  Urine culture     Status: None   Collection Time: 02/06/15  1:10 AM  Result Value Ref Range Status   Specimen Description URINE, CLEAN CATCH  Final   Special Requests NONE  Final   Culture NO GROWTH 1 DAY  Final   Report  Status 02/07/2015 FINAL  Final    Coagulation Studies: No results for input(s): LABPROT, INR in the last 72 hours.  Urinalysis:  Recent Labs  02/13/15 1139  COLORURINE YELLOW*  LABSPEC 1.009  PHURINE 5.0  GLUCOSEU NEGATIVE  HGBUR 1+*  BILIRUBINUR NEGATIVE  KETONESUR NEGATIVE  PROTEINUR NEGATIVE  NITRITE NEGATIVE  LEUKOCYTESUR 1+*      Imaging: Ct Abdomen Pelvis Wo Contrast  02/13/2015   CLINICAL DATA:  Abdominal distension. Cholecystectomy 6 days ago. Constipation, leukocytosis, and dysuria.  EXAM: CT ABDOMEN AND PELVIS WITHOUT CONTRAST  TECHNIQUE: Multidetector CT imaging of the abdomen and pelvis was performed following the standard protocol without IV contrast.  COMPARISON:  02/05/2015  FINDINGS: 4 mm nodule in the right middle lobe is unchanged (series 4, image 7). 3  mm right lower lobe subpleural nodule is also unchanged (series 4, image 7). Subsegmental atelectasis is noted in both lung bases. Three-vessel coronary artery calcification is noted.  Sequelae of interval cholecystectomy are identified with minimal stranding/soft tissue thickening in the gallbladder fossa. No gallbladder fossa fluid collection is seen. The liver, spleen, and pancreas are unremarkable. Minimal bilateral adrenal gland thickening is unchanged. Bilateral renal atrophy is again seen with unchanged low-density renal lesions bilaterally compatible with cysts. There is mild bilateral hydronephrosis. There is mild bilateral perinephric stranding which extends inferiorly in the retroperitoneum, greater on the left particularly adjacent to the left ureter and increased from the prior study.  A small amount of contrast is noted in the distal esophagus, possibly representing reflux. Oral contrast is present in multiple nondilated loops of small bowel. There is no evidence of bowel obstruction. There is also some residual oral contrast in the colon. The appendix is unremarkable. There is prominent distention of the bladder.  Moderate aortic atherosclerosis is noted. Mild postsurgical changes are noted in the midline of the lower abdomen. The prostate is mildly enlarged. No free fluid or enlarged lymph nodes are identified. Thoracolumbar spondylosis is noted. There is grade 1 anterolisthesis of L5 on S1 which appears facet mediated.  IMPRESSION: 1. Mild bilateral hydronephrosis likely secondary to prominent bladder distention. Mildly increased retroperitoneal stranding, particularly adjacent to the inferior left kidney and about the left ureter, nonspecific however upper tract infection is a consideration. 2. Interval cholecystectomy.   Electronically Signed   By: Logan Bores   On: 02/13/2015 16:36   Dg Abd 1 View  02/13/2015   CLINICAL DATA:  Gallbladder surgery 1 week ago with urinary retention.  EXAM:  ABDOMEN - 1 VIEW  COMPARISON:  Abdominal CT 02/05/2015  FINDINGS: Normal bowel gas pattern. Previously administered oral contrast is still visible in the nondilated colon. There are changes of cholecystectomy, which was recent. No concerning intra-abdominal mass effect or calcification. The bladder shadow is visible, bladder volume may be better estimated by sonography.  IMPRESSION: 1. Nonobstructive bowel gas pattern. 2. Visible bladder shadow. Bladder volume would be better estimated by sonography.   Electronically Signed   By: Monte Fantasia M.D.   On: 02/13/2015 11:38   US Renal  02/13/2015   CLINICAL DATA:  Acute renal failure.  EXAM: RENAL / URINARY TRACT ULTRASOUND COMPLETE  COMPARISON:  CT abdomen and pelvis 02/05/2015. Renal ultrasound 07/23/2010.  FINDINGS: Right Kidney:  Length: 13.9 cm. Cortical thinning. Several cysts were noted, the largest measuring 4.0 cm in the interpolar region/ lower pole. No definite solid mass or hydronephrosis.  Left Kidney:  Length: 15.0 cm. Cortical thinning. Two cysts were noted, the larger measuring 3.0  cm in the interpolar kidney. No hydronephrosis.  Bladder:  Appears normal for degree of bladder distention. Prevoid volume calculated at 1008 cc. Patient was unable to void.  IMPRESSION: 1. Bilateral renal cortical thinning and cysts.  No hydronephrosis. 2. Bladder volume as above.  Patient unable to void.   Electronically Signed   By: Logan Bores   On: 02/13/2015 15:53     Medications:   . sodium chloride 100 mL/hr at 02/14/15 0538   . Cholecalciferol  1,000 Units Oral Daily  . diltiazem  180 mg Oral Daily  . febuxostat  40 mg Oral Daily  . heparin  5,000 Units Subcutaneous 3 times per day  . pantoprazole  40 mg Oral Daily  . pravastatin  10 mg Oral QHS  . rOPINIRole  1-2 mg Oral QHS  . terazosin  5 mg Oral Daily   acetaminophen **OR** acetaminophen, alum & mag hydroxide-simeth, cholestyramine, colchicine, ondansetron **OR** ondansetron (ZOFRAN) IV,  oxyCODONE-acetaminophen  Assessment/ Plan:  Mr. Kenney Going is a 74 y.o. white  male male hypertension, impaired glucose tolerance, restless leg syndrome, vitamin D deficiency, microalbuminuria, obstructive sleep apnea, GERD  1. Acute Renal Failure on chronic kidney disease stage III: Baseline creatinine of 1.27, eGFR of 52 on 10/13/2014. Follows closely with Dr. Holley Raring.  - Acute renal failure seems to be secondary to prerenal azotemia. However with obstructive symptoms and foley catheter placed with good output. Renal ultrasound without hydronephrosis.  - Continue IV fluids, good urine output. No acute indication for dialysis - holding hydrochlorothiazide and enalapril.   2. Hypertension: mildly elevated on encounter. Currently holding current medications of hydrochlorothiazide and enalapril.  - Currently on terazosin, diltiazem  3. Hyperlipidemia: continue statin     LOS: [place], [place] 6/7/201612:53 PM

## 2015-02-14 NOTE — Care Management Note (Signed)
I will monitor patients progression during this admission for possible need for out patient dialysis.  Patients kidney function is currently improving. Iran Sizer [phone]

## 2015-02-15 LAB — BASIC METABOLIC PANEL
Anion gap: 12 (ref 5–15)
BUN: 29 mg/dL — ABNORMAL HIGH (ref 6–20)
CALCIUM: 8 mg/dL — AB (ref 8.9–10.3)
CO2: 28 mmol/L (ref 22–32)
Chloride: 105 mmol/L (ref 101–111)
Creatinine, Ser: 2.26 mg/dL — ABNORMAL HIGH (ref 0.61–1.24)
GFR calc Af Amer: 31 mL/min — ABNORMAL LOW (ref >60)
GFR calc non Af Amer: 26 mL/min — ABNORMAL LOW (ref >60)
GLUCOSE: 100 mg/dL — AB (ref 65–99)
Potassium: 3.5 mmol/L (ref 3.5–5.1)
Sodium: 145 mmol/L (ref 135–145)

## 2015-02-15 LAB — CBC
HEMATOCRIT: 30.1 % — AB (ref 40.0–52.0)
Hemoglobin: 9.8 g/dL — ABNORMAL LOW (ref 13.0–18.0)
MCH: 25.9 pg — ABNORMAL LOW (ref 26.0–34.0)
MCHC: 32.7 g/dL (ref 32.0–36.0)
MCV: 79.4 fL — AB (ref 80.0–100.0)
Platelets: 497 10*3/uL — ABNORMAL HIGH (ref 150–440)
RBC: 3.79 MIL/uL — AB (ref 4.40–5.90)
RDW: 14.5 % (ref 11.5–14.5)
WBC: 13.4 10*3/uL — ABNORMAL HIGH (ref 3.8–10.6)

## 2015-02-15 MED ORDER — AMLODIPINE BESYLATE 5 MG PO TABS: 5.0000 mg | ORAL_TABLET | Freq: Every day | ORAL | Status: DC

## 2015-02-15 MED ORDER — DILTIAZEM HCL ER COATED BEADS 120 MG PO CP24
240.0000 mg | ORAL_CAPSULE | Freq: Every day | ORAL | Status: DC
  Administered 2015-02-16 – 2015-02-17 (×2): 240 mg via ORAL
  Filled 2015-02-15 (×2): qty 2

## 2015-02-15 NOTE — Progress Notes (Signed)
[facility] [place] NAME: Corey Johnson    MR#:  [id number]  DATE OF BIRTH:  03/18/1941  SUBJECTIVE:  CHIEF COMPLAINT:  Feeling better, had one BM today. No new complaints. Wants to eat solid food  REVIEW OF SYSTEMS:  CONSTITUTIONAL: No fever, fatigue or weakness.  EYES: No blurred or double vision.  EARS, NOSE, AND THROAT: No tinnitus or ear pain.  RESPIRATORY: No cough, shortness of breath, wheezing or hemoptysis.  CARDIOVASCULAR: No chest pain, orthopnea, edema.  GASTROINTESTINAL: No nausea, vomiting, diarrhea or abdominal pain.  GENITOURINARY: No dysuria, hematuria.  ENDOCRINE: No polyuria, nocturia,  HEMATOLOGY: No anemia, easy bruising or bleeding SKIN: No rash or lesion. MUSCULOSKELETAL: No joint pain or arthritis.   NEUROLOGIC: No tingling, numbness, weakness.  PSYCHIATRY: No anxiety or depression.   DRUG ALLERGIES:  No Known Allergies  VITALS:  Blood pressure 154/74, pulse 70, temperature 98.1 F (36.7 C), temperature source Oral, resp. rate 16, height 5\' 9"  (1.753 m), weight 111.131 kg (245 lb), SpO2 93 %.  PHYSICAL EXAMINATION:  GENERAL:  74 y.o.-year-old patient lying in the bed with no acute distress.  EYES: Pupils equal, round, reactive to light and accommodation. No scleral icterus. Extraocular muscles intact.  HEENT: Head atraumatic, normocephalic. Oropharynx and nasopharynx clear.  NECK:  Supple, no jugular venous distention. No thyroid enlargement, no tenderness.  LUNGS: Normal breath sounds bilaterally, no wheezing, rales,rhonchi or crepitation. No use of accessory muscles of respiration.  CARDIOVASCULAR: S1, S2 normal. No murmurs, rubs, or gallops.  ABDOMEN: Soft, nontender, nondistended. Bowel sounds present. No organomegaly or mass.  EXTREMITIES: No pedal edema, cyanosis, or clubbing.  NEUROLOGIC: Cranial nerves II through XII are intact. Muscle strength 5/5 in all extremities. Sensation intact. Gait not  checked.  PSYCHIATRIC: The patient is alert and oriented x 3.  SKIN: No obvious rash, lesion, or ulcer.    LABORATORY PANEL:   CBC  Recent Labs Lab 02/15/15 0525  WBC 13.4*  HGB 9.8*  HCT 30.1*  PLT 497*   ------------------------------------------------------------------------------------------------------------------  Chemistries   Recent Labs Lab 02/13/15 1140  02/15/15 0525  NA 141  < > 145  K 4.3  < > 3.5  CL 101  < > 105  CO2 27  < > 28  GLUCOSE 98  < > 100*  BUN 53*  < > 29*  CREATININE 4.08*  < > 2.26*  CALCIUM 8.4*  < > 8.0*  AST 24  --   --   ALT 29  --   --   ALKPHOS 70  --   --   BILITOT 0.4  --   --   < > = values in this interval not displayed. ------------------------------------------------------------------------------------------------------------------  Cardiac Enzymes No results for input(s): TROPONINI in the last 168 hours. ------------------------------------------------------------------------------------------------------------------  RADIOLOGY:  Ct Abdomen Pelvis Wo Contrast  02/13/2015   CLINICAL DATA:  Abdominal distension. Cholecystectomy 6 days ago. Constipation, leukocytosis, and dysuria.  EXAM: CT ABDOMEN AND PELVIS WITHOUT CONTRAST  TECHNIQUE: Multidetector CT imaging of the abdomen and pelvis was performed following the standard protocol without IV contrast.  COMPARISON:  02/05/2015  FINDINGS: 4 mm nodule in the right middle lobe is unchanged (series 4, image 7). 3 mm right lower lobe subpleural nodule is also unchanged (series 4, image 7). Subsegmental atelectasis is noted in both lung bases. Three-vessel coronary artery calcification is noted.  Sequelae of interval cholecystectomy are identified with minimal stranding/soft tissue thickening in the gallbladder fossa. No  gallbladder fossa fluid collection is seen. The liver, spleen, and pancreas are unremarkable. Minimal bilateral adrenal gland thickening is unchanged. Bilateral renal  atrophy is again seen with unchanged low-density renal lesions bilaterally compatible with cysts. There is mild bilateral hydronephrosis. There is mild bilateral perinephric stranding which extends inferiorly in the retroperitoneum, greater on the left particularly adjacent to the left ureter and increased from the prior study.  A small amount of contrast is noted in the distal esophagus, possibly representing reflux. Oral contrast is present in multiple nondilated loops of small bowel. There is no evidence of bowel obstruction. There is also some residual oral contrast in the colon. The appendix is unremarkable. There is prominent distention of the bladder.  Moderate aortic atherosclerosis is noted. Mild postsurgical changes are noted in the midline of the lower abdomen. The prostate is mildly enlarged. No free fluid or enlarged lymph nodes are identified. Thoracolumbar spondylosis is noted. There is grade 1 anterolisthesis of L5 on S1 which appears facet mediated.  IMPRESSION: 1. Mild bilateral hydronephrosis likely secondary to prominent bladder distention. Mildly increased retroperitoneal stranding, particularly adjacent to the inferior left kidney and about the left ureter, nonspecific however upper tract infection is a consideration. 2. Interval cholecystectomy.   Electronically Signed   By: Logan Bores   On: 02/13/2015 16:36    EKG:   Orders placed or performed during the hospital encounter of 02/05/15  . EKG 12-Lead  . EKG 12-Lead  . EKG    ASSESSMENT AND PLAN:   #. Acute renal failure on chronic renal failure- prerenal / post renal with mild hydronephrosis 2/2 constipation  baseline cr was 1.45 now 4.08. IVF Laxatives hold his HCTZ as well as enalapril.   Flomax, foley cath, will discontinue Foley catheter if okay with urology F/u with nephro and uro  #. Abdominal distention recent surgery patient has significant abdominal distention no bowel movement leukocytosis, recent abdominal  surgery Surgery evaluation  -recommended outpatient follow-up  #. Hypertension: elevated bp , increased Cardizem and Hytrin doses as his HCTZ and enalapril will be on hold. .  #. GoutContinue Rx and colchicine   #. HyperlipidemiaContinue pravastatin   7. Miscellaneous: Heparin for DVT prophylaxis    All the records are reviewed and case discussed with Care Management/Social Workerr. Management plans discussed with the patient, family and they are in agreement.  CODE STATUS: full  TOTAL TIME TAKING CARE OF THIS PATIENT: 35 minutes.   POSSIBLE D/C IN 2-3 DAYS, DEPENDING ON CLINICAL CONDITION.   Nicholes Mango M.D on 02/15/2015 at 3:52 PM  Between 7am to 6pm - Pager - [phone] After 6pm go to [email or web address] - password [email or web address] [facility]  Office  [phone]  CC: Primary care physician; [facility], MD

## 2015-02-15 NOTE — Progress Notes (Signed)
Subjective:   He is feeling much better with his Foley in place. He has some urgency to void but that sensation is likely secondary to the Foley. He put out essentially 4 L since yesterday. Reviewing his CT scan I suspect that he had urinary retention as a source of his initial renal failure. He was followed by Dr. 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 I suspect his surgery and the disruption of his primary medical therapy was also responsible for his symptoms. He does not appear to have any evidence of complication from his biliary tract disease with regard to his gallbladder.  Vital signs in last 24 hours: Temp:  [98.1 F (36.7 C)-98.8 F (37.1 C)] 98.1 F (36.7 C) (06/08 0744) Pulse Rate:  [70-74] 70 (06/08 0744) Resp:  [16-18] 16 (06/08 0744) BP: (148-158)/(73-74) 154/74 mmHg (06/08 0744) SpO2:  [93 %-94 %] 93 % (06/08 0744) Last BM Date: 02/14/15  Intake/Output from previous day: 06/07 0701 - 06/08 0700 In: 2107.7 [P.O.:118; I.V.:1989.7] Out: 4700 [Urine:4700]  Exam:  No exam was performed today. His wounds look good. He is eating well and has no complaints of abdominal pain  Lab Results:  CBC  Recent Labs  02/14/15 0704 02/15/15 0525  WBC 14.3* 13.4*  HGB 9.3* 9.8*  HCT 28.8* 30.1*  PLT 481* 497*   CMP     Component Value Date/Time   NA 145 02/15/2015 0525   NA 134* 09/09/2014 0439   K 3.5 02/15/2015 0525   K 4.3 09/09/2014 0439   CL 105 02/15/2015 0525   CL 100 09/09/2014 0439   CO2 28 02/15/2015 0525   CO2 28 09/09/2014 0439   GLUCOSE 100* 02/15/2015 0525   GLUCOSE 163* 09/09/2014 0439   BUN 29* 02/15/2015 0525   BUN 17 09/09/2014 0439   CREATININE 2.26* 02/15/2015 0525   CREATININE 1.59* 09/09/2014 0439   CALCIUM 8.0* 02/15/2015 0525   CALCIUM 8.2* 09/09/2014 0439   PROT 7.1 02/13/2015 1140   PROT 7.9 08/04/2012 1537   ALBUMIN 2.7* 02/13/2015 1140   ALBUMIN 4.1 08/04/2012 1537   AST 24 02/13/2015 1140    AST 23 08/04/2012 1537   ALT 29 02/13/2015 1140   ALT 20 08/04/2012 1537   ALKPHOS 70 02/13/2015 1140   ALKPHOS 107 08/04/2012 1537   BILITOT 0.4 02/13/2015 1140   GFRNONAA 26* 02/15/2015 0525   GFRNONAA 34* 08/04/2012 1537   GFRAA 31* 02/15/2015 0525   GFRAA 39* 08/04/2012 1537   PT/INR No results for input(s): LABPROT, INR in the last 72 hours.  Studies/Results: Ct Abdomen Pelvis Wo Contrast  02/13/2015   CLINICAL DATA:  Abdominal distension. Cholecystectomy 6 days ago. Constipation, leukocytosis, and dysuria.  EXAM: CT ABDOMEN AND PELVIS WITHOUT CONTRAST  TECHNIQUE: Multidetector CT imaging of the abdomen and pelvis was performed following the standard protocol without IV contrast.  COMPARISON:  02/05/2015  FINDINGS: 4 mm nodule in the right middle lobe is unchanged (series 4, image 7). 3 mm right lower lobe subpleural nodule is also unchanged (series 4, image 7). Subsegmental atelectasis is noted in both lung bases. Three-vessel coronary artery calcification is noted.  Sequelae of interval cholecystectomy are identified with minimal stranding/soft tissue thickening in the gallbladder fossa. No gallbladder fossa fluid collection is seen. The liver, spleen, and pancreas are unremarkable. Minimal bilateral adrenal gland thickening is unchanged. Bilateral renal atrophy is again seen with unchanged low-density renal lesions bilaterally compatible with cysts. There  is mild bilateral hydronephrosis. There is mild bilateral perinephric stranding which extends inferiorly in the retroperitoneum, greater on the left particularly adjacent to the left ureter and increased from the prior study.  A small amount of contrast is noted in the distal esophagus, possibly representing reflux. Oral contrast is present in multiple nondilated loops of small bowel. There is no evidence of bowel obstruction. There is also some residual oral contrast in the colon. The appendix is unremarkable. There is prominent distention  of the bladder.  Moderate aortic atherosclerosis is noted. Mild postsurgical changes are noted in the midline of the lower abdomen. The prostate is mildly enlarged. No free fluid or enlarged lymph nodes are identified. Thoracolumbar spondylosis is noted. There is grade 1 anterolisthesis of L5 on S1 which appears facet mediated.  IMPRESSION: 1. Mild bilateral hydronephrosis likely secondary to prominent bladder distention. Mildly increased retroperitoneal stranding, particularly adjacent to the inferior left kidney and about the left ureter, nonspecific however upper tract infection is a consideration. 2. Interval cholecystectomy.   Electronically Signed   By: Logan Bores   On: 02/13/2015 16:36   Dg Abd 1 View  02/13/2015   CLINICAL DATA:  Gallbladder surgery 1 week ago with urinary retention.  EXAM: ABDOMEN - 1 VIEW  COMPARISON:  Abdominal CT 02/05/2015  FINDINGS: Normal bowel gas pattern. Previously administered oral contrast is still visible in the nondilated colon. There are changes of cholecystectomy, which was recent. No concerning intra-abdominal mass effect or calcification. The bladder shadow is visible, bladder volume may be better estimated by sonography.  IMPRESSION: 1. Nonobstructive bowel gas pattern. 2. Visible bladder shadow. Bladder volume would be better estimated by sonography.   Electronically Signed   By: Monte Fantasia M.D.   On: 02/13/2015 11:38   US Renal  02/13/2015   CLINICAL DATA:  Acute renal failure.  EXAM: RENAL / URINARY TRACT ULTRASOUND COMPLETE  COMPARISON:  CT abdomen and pelvis 02/05/2015. Renal ultrasound 07/23/2010.  FINDINGS: Right Kidney:  Length: 13.9 cm. Cortical thinning. Several cysts were noted, the largest measuring 4.0 cm in the interpolar region/ lower pole. No definite solid mass or hydronephrosis.  Left Kidney:  Length: 15.0 cm. Cortical thinning. Two cysts were noted, the larger measuring 3.0 cm in the interpolar kidney. No hydronephrosis.  Bladder:  Appears  normal for degree of bladder distention. Prevoid volume calculated at 1008 cc. Patient was unable to void.  IMPRESSION: 1. Bilateral renal cortical thinning and cysts.  No hydronephrosis. 2. Bladder volume as above.  Patient unable to void.   Electronically Signed   By: Logan Bores   On: 02/13/2015 15:53    Assessment/Plan: Overall he seems to be improving. We will continue our plan to see him back in the office as necessary. I would suspect that he will require discharge with Foley catheter and follow up with his urologist. His daughter was present for the interview and exam.

## 2015-02-15 NOTE — Progress Notes (Signed)
[facility]  ROUNDING NOTE   Subjective:   UOP 4700. Complains of foley catheter. Wants diet advanced.  Urology consulted.   Objective:  Vital signs in last 24 hours:  Temp:  [98.1 F (36.7 C)-98.8 F (37.1 C)] 98.1 F (36.7 C) (06/08 0744) Pulse Rate:  [70-74] 70 (06/08 0744) Resp:  [16-18] 16 (06/08 0744) BP: (148-158)/(73-74) 154/74 mmHg (06/08 0744) SpO2:  [93 %-94 %] 93 % (06/08 0744)  Weight change:  Filed Weights   02/13/15 1015  Weight: 111.131 kg (245 lb)    Intake/Output: I/O last 3 completed shifts: In: 3102.7 [P.O.:118; I.V.:2984.7] Out: 8650 [Urine:8650]   Intake/Output this shift:     Physical Exam: General: NAD,   Head: Normocephalic, atraumatic. Moist oral mucosal membranes  Eyes: Anicteric, PERRL  Neck: Supple, trachea midline  Lungs:  Clear to auscultation  Heart: Regular rate and rhythm  Abdomen:  +distended,  +bowel sounds  Extremities: no peripheral edema.  Neurologic: Nonfocal, moving all four extremities  Skin: No lesions       Basic Metabolic Panel:  Recent Labs Lab 02/13/15 1140 02/14/15 0550 02/15/15 0525  NA 141 141 145  K 4.3 4.1 3.5  CL 101 105 105  CO2 _0 GLUCOSE 98 100* 100*  BUN 53* 46* 29*  CREATININE 4.08* 3.55* 2.26*  CALCIUM 8.4* 7.9* 8.0*    Liver Function Tests:  Recent Labs Lab 02/13/15 1140  AST 24  ALT 29  ALKPHOS 70  BILITOT 0.4  PROT 7.1  ALBUMIN 2.7*   No results for input(s): LIPASE, AMYLASE in the last 168 hours. No results for input(s): AMMONIA in the last 168 hours.  CBC:  Recent Labs Lab 02/13/15 1140 02/14/15 0704 02/15/15 0525  WBC 17.1* 14.3* 13.4*  HGB 10.4* 9.3* 9.8*  HCT 32.1* 28.8* 30.1*  MCV 80.3 79.5* 79.4*  PLT 556* 481* 497*    Cardiac Enzymes: No results for input(s): CKTOTAL, CKMB, CKMBINDEX, TROPONINI in the last 168 hours.  BNP: Invalid input(s): POCBNP  CBG: No results for input(s): GLUCAP in the last 168  hours.  Microbiology: Results for orders placed or performed during the hospital encounter of 02/05/15  Blood culture (routine x 2)     Status: None   Collection Time: 02/05/15  7:42 PM  Result Value Ref Range Status   Specimen Description BLOOD  Final   Special Requests BLOOD LEFT ARM  Final   Culture NO GROWTH 5 DAYS  Final   Report Status 02/10/2015 FINAL  Final  Culture, blood (routine x 2)     Status: None   Collection Time: 02/05/15  7:44 PM  Result Value Ref Range Status   Specimen Description BLOOD  Final   Special Requests BLOOD RIGHT HAND  Final   Culture NO GROWTH 5 DAYS  Final   Report Status 02/10/2015 FINAL  Final  Urine culture     Status: None   Collection Time: 02/06/15  1:10 AM  Result Value Ref Range Status   Specimen Description URINE, CLEAN CATCH  Final   Special Requests NONE  Final   Culture NO GROWTH 1 DAY  Final   Report Status 02/07/2015 FINAL  Final    Coagulation Studies: No results for input(s): LABPROT, INR in the last 72 hours.  Urinalysis:  Recent Labs  02/13/15 1139  COLORURINE YELLOW*  LABSPEC 1.009  PHURINE 5.0  GLUCOSEU NEGATIVE  HGBUR 1+*  BILIRUBINUR NEGATIVE  KETONESUR NEGATIVE  PROTEINUR NEGATIVE  NITRITE NEGATIVE  LEUKOCYTESUR 1+*      Imaging: Ct Abdomen Pelvis Wo Contrast  02/13/2015   CLINICAL DATA:  Abdominal distension. Cholecystectomy 6 days ago. Constipation, leukocytosis, and dysuria.  EXAM: CT ABDOMEN AND PELVIS WITHOUT CONTRAST  TECHNIQUE: Multidetector CT imaging of the abdomen and pelvis was performed following the standard protocol without IV contrast.  COMPARISON:  02/05/2015  FINDINGS: 4 mm nodule in the right middle lobe is unchanged (series 4, image 7). 3 mm right lower lobe subpleural nodule is also unchanged (series 4, image 7). Subsegmental atelectasis is noted in both lung bases. Three-vessel coronary artery calcification is noted.  Sequelae of interval cholecystectomy are identified with minimal  stranding/soft tissue thickening in the gallbladder fossa. No gallbladder fossa fluid collection is seen. The liver, spleen, and pancreas are unremarkable. Minimal bilateral adrenal gland thickening is unchanged. Bilateral renal atrophy is again seen with unchanged low-density renal lesions bilaterally compatible with cysts. There is mild bilateral hydronephrosis. There is mild bilateral perinephric stranding which extends inferiorly in the retroperitoneum, greater on the left particularly adjacent to the left ureter and increased from the prior study.  A small amount of contrast is noted in the distal esophagus, possibly representing reflux. Oral contrast is present in multiple nondilated loops of small bowel. There is no evidence of bowel obstruction. There is also some residual oral contrast in the colon. The appendix is unremarkable. There is prominent distention of the bladder.  Moderate aortic atherosclerosis is noted. Mild postsurgical changes are noted in the midline of the lower abdomen. The prostate is mildly enlarged. No free fluid or enlarged lymph nodes are identified. Thoracolumbar spondylosis is noted. There is grade 1 anterolisthesis of L5 on S1 which appears facet mediated.  IMPRESSION: 1. Mild bilateral hydronephrosis likely secondary to prominent bladder distention. Mildly increased retroperitoneal stranding, particularly adjacent to the inferior left kidney and about the left ureter, nonspecific however upper tract infection is a consideration. 2. Interval cholecystectomy.   Electronically Signed   By: Logan Bores   On: 02/13/2015 16:36   Dg Abd 1 View  02/13/2015   CLINICAL DATA:  Gallbladder surgery 1 week ago with urinary retention.  EXAM: ABDOMEN - 1 VIEW  COMPARISON:  Abdominal CT 02/05/2015  FINDINGS: Normal bowel gas pattern. Previously administered oral contrast is still visible in the nondilated colon. There are changes of cholecystectomy, which was recent. No concerning  intra-abdominal mass effect or calcification. The bladder shadow is visible, bladder volume may be better estimated by sonography.  IMPRESSION: 1. Nonobstructive bowel gas pattern. 2. Visible bladder shadow. Bladder volume would be better estimated by sonography.   Electronically Signed   By: Monte Fantasia M.D.   On: 02/13/2015 11:38   US Renal  02/13/2015   CLINICAL DATA:  Acute renal failure.  EXAM: RENAL / URINARY TRACT ULTRASOUND COMPLETE  COMPARISON:  CT abdomen and pelvis 02/05/2015. Renal ultrasound 07/23/2010.  FINDINGS: Right Kidney:  Length: 13.9 cm. Cortical thinning. Several cysts were noted, the largest measuring 4.0 cm in the interpolar region/ lower pole. No definite solid mass or hydronephrosis.  Left Kidney:  Length: 15.0 cm. Cortical thinning. Two cysts were noted, the larger measuring 3.0 cm in the interpolar kidney. No hydronephrosis.  Bladder:  Appears normal for degree of bladder distention. Prevoid volume calculated at 1008 cc. Patient was unable to void.  IMPRESSION: 1. Bilateral renal cortical thinning and cysts.  No hydronephrosis. 2. Bladder volume as above.  Patient unable to void.   Electronically Signed   By:  Logan Bores   On: 02/13/2015 15:53     Medications:   . sodium chloride 100 mL/hr at 02/14/15 1543   . Cholecalciferol  1,000 Units Oral Daily  . diltiazem  180 mg Oral Daily  . febuxostat  40 mg Oral Daily  . heparin  5,000 Units Subcutaneous 3 times per day  . pantoprazole  40 mg Oral Daily  . pravastatin  10 mg Oral QHS  . rOPINIRole  1-2 mg Oral QHS  . terazosin  10 mg Oral Daily   acetaminophen **OR** acetaminophen, alum & mag hydroxide-simeth, cholestyramine, colchicine, ondansetron **OR** ondansetron (ZOFRAN) IV, oxyCODONE-acetaminophen  Assessment/ Plan:  [name] is a 74 y.o. white  male male hypertension, impaired glucose tolerance, restless leg syndrome, vitamin D deficiency, microalbuminuria, obstructive sleep apnea, GERD  1.  Acute Renal Failure on chronic kidney disease stage III: Baseline creatinine of 1.27, eGFR of 52 on 10/13/2014. Follows closely with Dr. Holley Raring.  - Acute renal failure seems to be secondary to prerenal azotemia. However with obstructive symptoms and foley catheter placed with good output. Renal ultrasound without hydronephrosis.  - Continue IV fluids, good urine output. No acute indication for dialysis - holding hydrochlorothiazide and enalapril.   2. Hypertension: mildly elevated on encounter. Currently holding current medications of hydrochlorothiazide and enalapril.  - Currently on terazosin, diltiazem  3. Hyperlipidemia: continue statin     LOS: 2 Corey Johnson, [place] 6/8/20169:48 AM

## 2015-02-15 NOTE — Progress Notes (Signed)
Urology Consult Follow Up  Subjective: Doing better.  Foley in place.  Having BMs.  Cr trending down with catheter, hydration.    Anti-infectives: Anti-infectives    None      Current Facility-Administered Medications  Medication Dose Route Frequency Provider Last Rate Last Dose  . 0.9 %  sodium chloride infusion   Intravenous Continuous Dustin Flock, MD 100 mL/hr at 02/15/15 1125    . acetaminophen (TYLENOL) tablet 650 mg  650 mg Oral Q6H PRN Dustin Flock, MD       Or  . acetaminophen (TYLENOL) suppository 650 mg  650 mg Rectal Q6H PRN Dustin Flock, MD      . alum & mag hydroxide-simeth (MAALOX/MYLANTA) 200-200-20 MG/5ML suspension 30 mL  30 mL Oral Q6H PRN Dustin Flock, MD   30 mL at 02/13/15 1840  . Cholecalciferol 1,000 Units  1,000 Units Oral Daily Dustin Flock, MD   1,000 Units at 02/14/15 1000  . cholestyramine (QUESTRAN) packet 4 g  4 g Oral TID PRN Dustin Flock, MD      . colchicine tablet 0.6 mg  0.6 mg Oral Daily PRN Dustin Flock, MD      . Derrill Memo ON 02/16/2015] diltiazem (CARDIZEM CD) 24 hr capsule 240 mg  240 mg Oral Daily Aruna Gouru, MD      . febuxostat (ULORIC) tablet 40 mg  40 mg Oral Daily Dustin Flock, MD   40 mg at 02/15/15 1125  . heparin injection 5,000 Units  5,000 Units Subcutaneous 3 times per day Dustin Flock, MD   5,000 Units at 02/15/15 1430  . ondansetron (ZOFRAN) tablet 4 mg  4 mg Oral Q6H PRN Dustin Flock, MD       Or  . ondansetron (ZOFRAN) injection 4 mg  4 mg Intravenous Q6H PRN Dustin Flock, MD      . oxyCODONE-acetaminophen (PERCOCET/ROXICET) 5-325 MG per tablet 1-2 tablet  1-2 tablet Oral Q4H PRN Dustin Flock, MD   2 tablet at 02/13/15 1840  . pantoprazole (PROTONIX) EC tablet 40 mg  40 mg Oral Daily Dustin Flock, MD   40 mg at 02/15/15 1122  . pravastatin (PRAVACHOL) tablet 10 mg  10 mg Oral QHS Dustin Flock, MD   10 mg at 02/14/15 2117  . rOPINIRole (REQUIP) tablet 1-2 mg  1-2 mg Oral QHS Dustin Flock, MD   2 mg at  02/14/15 1543  . terazosin (HYTRIN) capsule 10 mg  10 mg Oral Daily Nicholes Mango, MD   10 mg at 02/15/15 1123     Objective: Vital signs in last 24 hours: Temp:  [98.1 F (36.7 C)-98.8 F (37.1 C)] 98.1 F (36.7 C) (06/08 0744) Pulse Rate:  [70-74] 70 (06/08 0744) Resp:  [16-18] 16 (06/08 0744) BP: (148-158)/(73-74) 154/74 mmHg (06/08 0744) SpO2:  [93 %-94 %] 93 % (06/08 0744)  Intake/Output from previous day: 06/07 0701 - 06/08 0700 In: 2107.7 [P.O.:118; I.V.:1989.7] Out: 4700 [Urine:4700] Intake/Output this shift: Total I/O In: 741.7 [I.V.:741.7] Out: 1900 [Urine:1900]   Physical Exam  Constitutional: He is oriented to person, place, and time and well-developed, well-nourished, and in no distress.  HENT:  Head: Normocephalic and atraumatic.  Neck: Normal range of motion. Neck supple.  Pulmonary/Chest: Effort normal and breath sounds normal.  Abdominal: Soft. He exhibits no distension.  Genitourinary:  Foley in place draining clear yellow urine  Musculoskeletal: Normal range of motion.  Neurological: He is alert and oriented to person, place, and time.  Skin: Skin is warm and dry.  Psychiatric: Affect normal.    Lab Results:   Recent Labs  02/14/15 0704 02/15/15 0525  WBC 14.3* 13.4*  HGB 9.3* 9.8*  HCT 28.8* 30.1*  PLT 481* 497*   BMET  Recent Labs  02/14/15 0550 02/15/15 0525  NA 141 145  K 4.1 3.5  CL 105 105  CO2 25 28  GLUCOSE 100* 100*  BUN 46* 29*  CREATININE 3.55* 2.26*  CALCIUM 7.9* 8.0*    Assessment: 74 yo M with history of BPH, recent gallbladder surgery, constipation admitted with urinary retention, acute of chronic renal failure.  Renal function improving, today 2.26 with catheter in place.    Plan: maintain Foley today  Continue home terazosin 10 mg daily Continue to trend Cr Recommend voiding trial Friday AM with measurement of PVR (bladder scan) to ensure adequate bladder emptying, call Dr. Erlene Quan if greater than 100 cc post  void Patient will need outpatient follow up either with Dr. Rogers Blocker (his previous Urologist) or is welcome to f/u with [place], contact info below:  [facility] [address] [place], [place] [address] [phone]   Urology to sign off, please page with any questions or concerns.    LOS: 2 days    Hollice Espy 02/15/2015

## 2015-02-16 ENCOUNTER — Ambulatory Visit: Payer: Self-pay | Admitting: Surgery

## 2015-02-16 LAB — BASIC METABOLIC PANEL
Anion gap: 12 (ref 5–15)
BUN: 20 mg/dL (ref 6–20)
CHLORIDE: 103 mmol/L (ref 101–111)
CO2: 28 mmol/L (ref 22–32)
CREATININE: 1.86 mg/dL — AB (ref 0.61–1.24)
Calcium: 7.7 mg/dL — ABNORMAL LOW (ref 8.9–10.3)
GFR calc non Af Amer: 34 mL/min — ABNORMAL LOW (ref >60)
GFR, EST AFRICAN AMERICAN: 39 mL/min — AB (ref >60)
Glucose, Bld: 111 mg/dL — ABNORMAL HIGH (ref 65–99)
POTASSIUM: 3.5 mmol/L (ref 3.5–5.1)
Sodium: 143 mmol/L (ref 135–145)

## 2015-02-16 NOTE — Progress Notes (Signed)
[facility]  ROUNDING NOTE   Subjective:   Patient with bowel movements. States his pain is better controlled.  Objective:  Vital signs in last 24 hours:  Temp:  [98 F (36.7 C)-98.5 F (36.9 C)] 98 F (36.7 C) (06/09 0822) Pulse Rate:  [72-82] 78 (06/09 0822) Resp:  [17-19] 19 (06/09 0822) BP: (129-147)/(57-79) 140/79 mmHg (06/09 0822) SpO2:  [93 %-97 %] 97 % (06/09 0822)  Weight change:  Filed Weights   02/13/15 1015  Weight: 111.131 kg (245 lb)    Intake/Output: I/O last 3 completed shifts: In: 3931.3 [P.O.:178; I.V.:3753.3] Out: 7850 [Urine:7850]   Intake/Output this shift:  Total I/O In: 548.3 [P.O.:240; I.V.:308.3] Out: 725 [Urine:725]  Physical Exam: General: NAD,   Head: Normocephalic, atraumatic. Moist oral mucosal membranes  Eyes: Anicteric, PERRL  Neck: Supple, trachea midline  Lungs:  Clear to auscultation  Heart: Regular rate and rhythm  Abdomen:  +distended,  +bowel sounds  Extremities: no peripheral edema.  Neurologic: Nonfocal, moving all four extremities  Skin: No lesions       Basic Metabolic Panel:  Recent Labs Lab 02/13/15 1140 02/14/15 0550 02/15/15 0525 02/16/15 0518  NA 141 141 145 143  K 4.3 4.1 3.5 3.5  CL 101 105 105 103  CO2 '27 25 28 28  ' GLUCOSE 98 100* 100* 111*  BUN 53* 46* 29* 20  CREATININE 4.08* 3.55* 2.26* 1.86*  CALCIUM 8.4* 7.9* 8.0* 7.7*    Liver Function Tests:  Recent Labs Lab 02/13/15 1140  AST 24  ALT 29  ALKPHOS 70  BILITOT 0.4  PROT 7.1  ALBUMIN 2.7*   No results for input(s): LIPASE, AMYLASE in the last 168 hours. No results for input(s): AMMONIA in the last 168 hours.  CBC:  Recent Labs Lab 02/13/15 1140 02/14/15 0704 02/15/15 0525  WBC 17.1* 14.3* 13.4*  HGB 10.4* 9.3* 9.8*  HCT 32.1* 28.8* 30.1*  MCV 80.3 79.5* 79.4*  PLT 556* 481* 497*    Cardiac Enzymes: No results for input(s): CKTOTAL, CKMB, CKMBINDEX, TROPONINI in the last 168 hours.  BNP: Invalid  input(s): POCBNP  CBG: No results for input(s): GLUCAP in the last 168 hours.  Microbiology: Results for orders placed or performed during the hospital encounter of 02/05/15  Blood culture (routine x 2)     Status: None   Collection Time: 02/05/15  7:42 PM  Result Value Ref Range Status   Specimen Description BLOOD  Final   Special Requests BLOOD LEFT ARM  Final   Culture NO GROWTH 5 DAYS  Final   Report Status 02/10/2015 FINAL  Final  Culture, blood (routine x 2)     Status: None   Collection Time: 02/05/15  7:44 PM  Result Value Ref Range Status   Specimen Description BLOOD  Final   Special Requests BLOOD RIGHT HAND  Final   Culture NO GROWTH 5 DAYS  Final   Report Status 02/10/2015 FINAL  Final  Urine culture     Status: None   Collection Time: 02/06/15  1:10 AM  Result Value Ref Range Status   Specimen Description URINE, CLEAN CATCH  Final   Special Requests NONE  Final   Culture NO GROWTH 1 DAY  Final   Report Status 02/07/2015 FINAL  Final    Coagulation Studies: No results for input(s): LABPROT, INR in the last 72 hours.  Urinalysis:  Recent Labs  02/13/15 1139  COLORURINE YELLOW*  LABSPEC 1.009  PHURINE 5.0  GLUCOSEU NEGATIVE  HGBUR  1+*  BILIRUBINUR NEGATIVE  KETONESUR NEGATIVE  PROTEINUR NEGATIVE  NITRITE NEGATIVE  LEUKOCYTESUR 1+*      Imaging: No results found.   Medications:   . sodium chloride 100 mL/hr at 02/15/15 2342   . Cholecalciferol  1,000 Units Oral Daily  . diltiazem  240 mg Oral Daily  . febuxostat  40 mg Oral Daily  . heparin  5,000 Units Subcutaneous 3 times per day  . pantoprazole  40 mg Oral Daily  . pravastatin  10 mg Oral QHS  . rOPINIRole  1-2 mg Oral QHS  . terazosin  10 mg Oral Daily   acetaminophen **OR** acetaminophen, alum & mag hydroxide-simeth, cholestyramine, colchicine, ondansetron **OR** ondansetron (ZOFRAN) IV, oxyCODONE-acetaminophen  Assessment/ Plan:  [name] is a 74 y.o. white  male  male hypertension, impaired glucose tolerance, restless leg syndrome, vitamin D deficiency, microalbuminuria, obstructive sleep apnea, GERD  1. Acute Renal Failure on chronic kidney disease stage III: Baseline creatinine of 1.27, eGFR of 52 on 10/13/2014. Follows closely with Dr. Holley Raring.  - Acute renal failure seems to be secondary to prerenal azotemia. However with obstructive symptoms and foley catheter placed with good output. Renal ultrasound without hydronephrosis.  - Continue IV fluids, good urine output. No acute indication for dialysis - holding hydrochlorothiazide and enalapril.   2. Hypertension: mildly elevated on encounter. Currently holding current medications of hydrochlorothiazide and enalapril.  - Currently on terazosin, diltiazem  3. Hyperlipidemia: continue statin     LOS: [place], [place] 6/9/201611:13 AM

## 2015-02-16 NOTE — Progress Notes (Signed)
[place] [facility] [place] NAME: Corey Johnson    MR#:  [id number]  DATE OF BIRTH:  December 17, 1940  SUBJECTIVE:  Has been having good bowel movements just complains of some abdominal soreness no nausea vomiting  REVIEW OF SYSTEMS:  CONSTITUTIONAL: No fever, fatigue or weakness.  EYES: No blurred or double vision.  EARS, NOSE, AND THROAT: No tinnitus or ear pain.  RESPIRATORY: No cough, shortness of breath, wheezing or hemoptysis.  CARDIOVASCULAR: No chest pain, orthopnea, edema.  GASTROINTESTINAL: No nausea, vomiting, diarrhea or abdominal pain.  GENITOURINARY: No dysuria, hematuria.  ENDOCRINE: No polyuria, nocturia,  HEMATOLOGY: No anemia, easy bruising or bleeding SKIN: No rash or lesion. MUSCULOSKELETAL: No joint pain or arthritis.   NEUROLOGIC: No tingling, numbness, weakness.  PSYCHIATRY: No anxiety or depression.   DRUG ALLERGIES:  No Known Allergies  VITALS:  Blood pressure 140/79, pulse 78, temperature 98 F (36.7 C), temperature source Oral, resp. rate 19, height 5\' 9"  (1.753 m), weight 111.131 kg (245 lb), SpO2 97 %.  PHYSICAL EXAMINATION:  GENERAL:  74 y.o.-year-old patient lying in the bed with no acute distress.  EYES: Pupils equal, round, reactive to light and accommodation. No scleral icterus. Extraocular muscles intact.  HEENT: Head atraumatic, normocephalic. Oropharynx and nasopharynx clear.  NECK:  Supple, no jugular venous distention. No thyroid enlargement, no tenderness.  LUNGS: Normal breath sounds bilaterally, no wheezing, rales,rhonchi or crepitation. No use of accessory muscles of respiration.  CARDIOVASCULAR: S1, S2 normal. No murmurs, rubs, or gallops.  ABDOMEN: Soft, nontender, nondistended. Bowel sounds present. No organomegaly or mass.  EXTREMITIES: No pedal edema, cyanosis, or clubbing.  NEUROLOGIC: Cranial nerves II through XII are intact. Muscle strength 5/5 in all extremities. Sensation intact. Gait not  checked.  PSYCHIATRIC: The patient is alert and oriented x 3.  SKIN: No obvious rash, lesion, or ulcer.    LABORATORY PANEL:   CBC  Recent Labs Lab 02/15/15 0525  WBC 13.4*  HGB 9.8*  HCT 30.1*  PLT 497*   ------------------------------------------------------------------------------------------------------------------  Chemistries   Recent Labs Lab 02/13/15 1140  02/16/15 0518  NA 141  < > 143  K 4.3  < > 3.5  CL 101  < > 103  CO2 27  < > 28  GLUCOSE 98  < > 111*  BUN 53*  < > 20  CREATININE 4.08*  < > 1.86*  CALCIUM 8.4*  < > 7.7*  AST 24  --   --   ALT 29  --   --   ALKPHOS 70  --   --   BILITOT 0.4  --   --   < > = values in this interval not displayed. ------------------------------------------------------------------------------------------------------------------  Cardiac Enzymes No results for input(s): TROPONINI in the last 168 hours. ------------------------------------------------------------------------------------------------------------------  RADIOLOGY:  No results found.  EKG:   Orders placed or performed during the hospital encounter of 02/05/15  . EKG 12-Lead  . EKG 12-Lead  . EKG    ASSESSMENT AND PLAN:   #. Acute renal failure on chronic renal failure- prerenal / post renal with mild hydronephrosis   Improved with Foley and IV fluids hold his HCTZ as well as enalapril.   Flomax, foley cath, Foley removal on Friday morning with post-residual check F/u with nephro and uro  #. Abdominal distention recent surgery patient has significant abdominal distention no bowel movement leukocytosis, recent abdominal surgery Symptoms now resolved with good bowel movement  #. Hypertension: Patient's blood pressure medications  have been adjusted .  #. GoutContinue Rx and colchicine   #. HyperlipidemiaContinue pravastatin   #. Miscellaneous: Heparin for DVT prophylaxis    All the records are reviewed and case discussed with Care  Management/Social Workerr. Management plans discussed with the patient, family and they are in agreement.  CODE STATUS: full  TOTAL TIME TAKING CARE OF THIS PATIENT: 25 minutes.   POSSIBLE D/C IN 2-3 DAYS, DEPENDING ON CLINICAL CONDITION.   Dustin Flock M.D on 02/16/2015 at 1:19 PM  Between 7am to 6pm - Pager - [phone] After 6pm go to [email or web address] - password [email or web address] [facility]  Office  [phone]  CC: Primary care physician; [facility], MD

## 2015-02-16 NOTE — Progress Notes (Signed)
   Subjective:  He is much improved over the last 24 hours. He continues to have a Foley catheter but the urology service is recommended another voiding trial. We anticipate a voiding trial this a.m. His abdomen is soft with no significant abdominal tenderness in his wounds look good. He has had a bowel movement. He has been ambulating without difficulty.  Vital signs in last 24 hours: Temp:  [98 F (36.7 C)-98.5 F (36.9 C)] 98 F (36.7 C) (06/09 0822) Pulse Rate:  [72-82] 78 (06/09 0822) Resp:  [17-19] 19 (06/09 0822) BP: (129-147)/(57-79) 140/79 mmHg (06/09 0822) SpO2:  [93 %-97 %] 97 % (06/09 0822) Last BM Date: 02/15/15  Intake/Output from previous day: 06/08 0701 - 06/09 0700 In: 2588.3 [P.O.:60; I.V.:2528.3] Out: 4550 [Urine:4550]  GI: His abdomen is benign with no significant concerns at the present time. His wounds look good.  Lab Results:  CBC  Recent Labs  02/14/15 0704 02/15/15 0525  WBC 14.3* 13.4*  HGB 9.3* 9.8*  HCT 28.8* 30.1*  PLT 481* 497*   CMP     Component Value Date/Time   NA 143 02/16/2015 0518   NA 134* 09/09/2014 0439   K 3.5 02/16/2015 0518   K 4.3 09/09/2014 0439   CL 103 02/16/2015 0518   CL 100 09/09/2014 0439   CO2 28 02/16/2015 0518   CO2 28 09/09/2014 0439   GLUCOSE 111* 02/16/2015 0518   GLUCOSE 163* 09/09/2014 0439   BUN 20 02/16/2015 0518   BUN 17 09/09/2014 0439   CREATININE 1.86* 02/16/2015 0518   CREATININE 1.59* 09/09/2014 0439   CALCIUM 7.7* 02/16/2015 0518   CALCIUM 8.2* 09/09/2014 0439   PROT 7.1 02/13/2015 1140   PROT 7.9 08/04/2012 1537   ALBUMIN 2.7* 02/13/2015 1140   ALBUMIN 4.1 08/04/2012 1537   AST 24 02/13/2015 1140   AST 23 08/04/2012 1537   ALT 29 02/13/2015 1140   ALT 20 08/04/2012 1537   ALKPHOS 70 02/13/2015 1140   ALKPHOS 107 08/04/2012 1537   BILITOT 0.4 02/13/2015 1140   GFRNONAA 34* 02/16/2015 0518   GFRNONAA 34* 08/04/2012 1537   GFRAA 39* 02/16/2015 0518   GFRAA 39* 08/04/2012 1537    PT/INR No results for input(s): LABPROT, INR in the last 72 hours.  Studies/Results: No results found.  Assessment/Plan: Overall from a general surgery standpoint he seems to be improving. I suspect much of his abdominal discomfort and caused readmission was due to his urinary retention. He does have a history of prostate problems. I have reviewed Dr. Erlene Quan note and I agree with her assessment and plan. At this point I do not see any further problems from our standpoint. We will sign off. Please reconsult if necessary.

## 2015-02-17 LAB — BASIC METABOLIC PANEL
Anion gap: 9 (ref 5–15)
BUN: 15 mg/dL (ref 6–20)
CALCIUM: 7.4 mg/dL — AB (ref 8.9–10.3)
CO2: 27 mmol/L (ref 22–32)
Chloride: 106 mmol/L (ref 101–111)
Creatinine, Ser: 1.64 mg/dL — ABNORMAL HIGH (ref 0.61–1.24)
GFR calc non Af Amer: 39 mL/min — ABNORMAL LOW (ref >60)
GFR, EST AFRICAN AMERICAN: 45 mL/min — AB (ref >60)
Glucose, Bld: 127 mg/dL — ABNORMAL HIGH (ref 65–99)
POTASSIUM: 3.5 mmol/L (ref 3.5–5.1)
Sodium: 142 mmol/L (ref 135–145)

## 2015-02-17 MED ORDER — DOCUSATE SODIUM 100 MG PO CAPS: 100.0000 mg | ORAL_CAPSULE | Freq: Two times a day (BID) | ORAL | Status: DC

## 2015-02-17 NOTE — Care Management Note (Signed)
Case Management Note  Patient Details  Name: Corey Johnson MRN: [id number] Date of Birth: Oct 22, 1940  Subjective/Objective:  RNCM consult for home health services. Admitted with urinary retention and will be discharged today with a foley catheter. Order received for home health nursing. Spoke with patient. He lives at home with his wife. He is independent with adls. Uses a CPAP as needed.  PCP is Dr. Dorthula Johnson and he has been seen in the past 6 months. Patient agreeable to home health with no agency preference. Referral Corey Johnson with [facility].  Patient denies issues obtaining medications, copays or transportation.                Action/Plan:   Expected Discharge Date:                  Expected Discharge Plan:  [place]  In-House Referral:     Discharge planning Services  CM Consult  Post Acute Care Choice:  Home Health Choice offered to:  Patient  DME Arranged:    DME Agency:     HH Arranged:  RN [place] Agency:  [facility]  Status of Service:  Completed, signed off  Medicare Important Message Given:  Yes Date Medicare IM Given:  02/17/15 Medicare IM give by:  Corey Johnson Date Additional Medicare IM Given:    Additional Medicare Important Message give by:     If discussed at [place] of Stay Meetings, dates discussed:    Additional Comments:  Corey Mango, RN 02/17/2015, 2:04 PM

## 2015-02-17 NOTE — Progress Notes (Signed)
Initial Nutrition Assessment  DOCUMENTATION CODES:     INTERVENTION:   (Meals and snacks: Cater to pt prefences)  NUTRITION DIAGNOSIS:  Inadequate oral intake related to altered GI function as evidenced by per patient/family report.    GOAL:  Patient will meet greater than or equal to 90% of their needs    MONITOR:   (Energy intake, Digestive system)  REASON FOR ASSESSMENT:   (length of stay)    ASSESSMENT:  Pt admitted with abdominal distention, constipation, ARF, urinary retention.  Past Medical History  Diagnosis Date  . Arthritis   . Hypertension   . Renal disorder   . Sleep apnea     CPAP  . Gout   . Psoriasis   . Vitamin D deficiency   . BPH (benign prostatic hyperplasia)   . Hyperlipemia   . CRF (chronic renal failure)   . H. pylori infection    Electrolyte and Renal Profile:  Recent Labs Lab 02/15/15 0525 02/16/15 0518 02/17/15 0819  BUN 29* 20 15  CREATININE 2.26* 1.86* 1.64*  NA 145 143 142  K 3.5 3.5 3.5   Medications: NS at 42ml/hr, colace  Current Nutrition: ate most of breakfast this am. Noted 100% recorded for yesterday am  Intake Prior to admission: Pt reports decreased intake for about 2 weeks during the middle of May secondary to abdominal pain from Gallbladder, since removed.    Height:  Ht Readings from Last 1 Encounters:  02/13/15 5\' 9"  (1.753 m)    Weight:  Wt Readings from Last 1 Encounters:  02/13/15 245 lb (111.131 kg)    Pt reports UBW of 245 pounds Wt Readings from Last 10 Encounters:  02/13/15 245 lb (111.131 kg)  02/06/15 240 lb 8 oz (109.09 kg)   Nutrition-Focused physical exam completed. Findings are WDL for fat depletion, muscle depletion, and edema.   BMI:  Body mass index is 36.16 kg/(m^2).   Diet Order:  Diet Heart Room service appropriate?: Yes; Fluid consistency:: Thin  EDUCATION NEEDS:  No education needs identified at this time   Intake/Output Summary (Last 24 hours) at 02/17/15  1111 Last data filed at 02/17/15 0900  Gross per 24 hour  Intake   1485 ml  Output   2175 ml  Net   -690 ml    Last BM:  6/8  LOW Care Level Kallin Henk B. Zenia Resides, [place], [place] (pager)

## 2015-02-17 NOTE — Progress Notes (Signed)
[facility]  ROUNDING NOTE   Subjective:   Foley removed this morning. He is waiting to void.  Patient states his appetite has improved. Regular bowel movements.   Objective:  Vital signs in last 24 hours:  Temp:  [97.8 F (36.6 C)-98.6 F (37 C)] 97.8 F (36.6 C) (06/10 0838) Pulse Rate:  [74-81] 74 (06/10 0838) Resp:  [18] 18 (06/10 0050) BP: (136-160)/(70-77) 158/70 mmHg (06/10 0838) SpO2:  [93 %-96 %] 95 % (06/10 0838)  Weight change:  Filed Weights   02/13/15 1015  Weight: 111.131 kg (245 lb)    Intake/Output: I/O last 3 completed shifts: In: 3006.7 [P.O.:420; I.V.:2586.7] Out: 4850 [Urine:4850]   Intake/Output this shift:     Physical Exam: General: NAD,   Head: Normocephalic, atraumatic. Moist oral mucosal membranes  Eyes: Anicteric, PERRL  Neck: Supple, trachea midline  Lungs:  Clear to auscultation  Heart: Regular rate and rhythm  Abdomen:  nondistended,  +bowel sounds, mild tenderness   Extremities: no peripheral edema.  Neurologic: Nonfocal, moving all four extremities  Skin: No lesions       Basic Metabolic Panel:  Recent Labs Lab 02/13/15 1140 02/14/15 0550 02/15/15 0525 02/16/15 0518 02/17/15 0819  NA 141 141 145 143 142  K 4.3 4.1 3.5 3.5 3.5  CL 101 105 105 103 106  CO2 _0 GLUCOSE 98 100* 100* 111* 127*  BUN 53* 46* 29* 20 15  CREATININE 4.08* 3.55* 2.26* 1.86* 1.64*  CALCIUM 8.4* 7.9* 8.0* 7.7* 7.4*    Liver Function Tests:  Recent Labs Lab 02/13/15 1140  AST 24  ALT 29  ALKPHOS 70  BILITOT 0.4  PROT 7.1  ALBUMIN 2.7*   No results for input(s): LIPASE, AMYLASE in the last 168 hours. No results for input(s): AMMONIA in the last 168 hours.  CBC:  Recent Labs Lab 02/13/15 1140 02/14/15 0704 02/15/15 0525  WBC 17.1* 14.3* 13.4*  HGB 10.4* 9.3* 9.8*  HCT 32.1* 28.8* 30.1*  MCV 80.3 79.5* 79.4*  PLT 556* 481* 497*    Cardiac Enzymes: No results for input(s): CKTOTAL, CKMB, CKMBINDEX,  TROPONINI in the last 168 hours.  BNP: Invalid input(s): POCBNP  CBG: No results for input(s): GLUCAP in the last 168 hours.  Microbiology: Results for orders placed or performed during the hospital encounter of 02/05/15  Blood culture (routine x 2)     Status: None   Collection Time: 02/05/15  7:42 PM  Result Value Ref Range Status   Specimen Description BLOOD  Final   Special Requests BLOOD LEFT ARM  Final   Culture NO GROWTH 5 DAYS  Final   Report Status 02/10/2015 FINAL  Final  Culture, blood (routine x 2)     Status: None   Collection Time: 02/05/15  7:44 PM  Result Value Ref Range Status   Specimen Description BLOOD  Final   Special Requests BLOOD RIGHT HAND  Final   Culture NO GROWTH 5 DAYS  Final   Report Status 02/10/2015 FINAL  Final  Urine culture     Status: None   Collection Time: 02/06/15  1:10 AM  Result Value Ref Range Status   Specimen Description URINE, CLEAN CATCH  Final   Special Requests NONE  Final   Culture NO GROWTH 1 DAY  Final   Report Status 02/07/2015 FINAL  Final    Coagulation Studies: No results for input(s): LABPROT, INR in the last 72 hours.  Urinalysis: No results for input(s):  COLORURINE, LABSPEC, [place], GLUCOSEU, HGBUR, BILIRUBINUR, KETONESUR, PROTEINUR, UROBILINOGEN, NITRITE, LEUKOCYTESUR in the last 72 hours.  Invalid input(s): APPERANCEUR    Imaging: No results found.   Medications:   . sodium chloride 50 mL/hr at 02/16/15 1405   . Cholecalciferol  1,000 Units Oral Daily  . diltiazem  240 mg Oral Daily  . docusate sodium  100 mg Oral BID  . febuxostat  40 mg Oral Daily  . heparin  5,000 Units Subcutaneous 3 times per day  . pantoprazole  40 mg Oral Daily  . pravastatin  10 mg Oral QHS  . rOPINIRole  1-2 mg Oral QHS  . terazosin  10 mg Oral Daily   acetaminophen **OR** acetaminophen, alum & mag hydroxide-simeth, cholestyramine, colchicine, ondansetron **OR** ondansetron (ZOFRAN) IV,  oxyCODONE-acetaminophen  Assessment/ Plan:  Corey Johnson is a 74 y.o. white  male male hypertension, impaired glucose tolerance, restless leg syndrome, vitamin D deficiency, microalbuminuria, obstructive sleep apnea, GERD  1. Acute Renal Failure on chronic kidney disease stage III: Baseline creatinine of 1.27, eGFR of 52 on 10/13/2014. Follows with Dr. Holley Johnson.  - Acute renal failure seems to be secondary to prerenal azotemia. However with obstructive symptoms and foley catheter placed with good output. Renal ultrasound without hydronephrosis.  - Continue IV fluids, good urine output. No acute indication for dialysis - holding hydrochlorothiazide and enalapril.   2. Hypertension: mildly elevated on encounter. Currently holding current medications of hydrochlorothiazide and enalapril.  - Currently on terazosin, diltiazem  3. Hyperlipidemia: continue statin  Follow up with Dr. Holley Johnson in 1-2 weeks. Will also need outpatient urology follow up.     LOS: [place], Corey Johnson 6/10/20169:49 AM

## 2015-02-17 NOTE — Progress Notes (Signed)
Pt d/c home; d/c instructions reviewed w/ pt; pt understanding was verbalized; IV removed catheter in tact, gauze dressing applied; all pt questions answered. Pt d/c w/ foley; pt and pt family teaching done about foley care, emptying foley and measuring output, CAUTI prevention; understanding of all teaching verbalized; per Dr Chana Bode Patel's request, Lattie Haw from care management notified re home health order; pt left unit via wheelchair accompanied by staff

## 2015-02-17 NOTE — Discharge Summary (Signed)
Corey Johnson, 74 y.o., DOB Nov 14, 1940, MRN [id number]. Admission date: 02/13/2015 Discharge Date 02/17/2015 Primary MD [facility], MD Admitting Physician Dustin Flock, MD  Admission Diagnosis  Abdominal distension [R14.0] ARF (acute renal failure) [N17.9] Acute renal failure, unspecified acute renal failure type [N17.9]  Discharge Diagnosis   Principal Problem:   Abdominal distension Active Problems:   ARF (acute renal failure)   GERD (gastroesophageal reflux disease)   CRI (chronic renal insufficiency)   Acute renal failure Urinary retention needs foley on d/c         Hospital Course Corey Johnson is a 74 y.o. male with a known history of chronic kidney disease stage III, who actually was hospitalized on 02/05/2015 and discharged on 02/08/2015 after he presented with abdominal discomfort. Patient was admitted to the surgical service with acute cholecystitis. He underwent a laparoscopic cholecystectomy and a drain was left.  Patient in ed was noted to ARF we were asked to admit.  Pt had ct of abdomen without IV contrast showed b/l hydronephrosis, he had foley placed, seen by urology and nephrology. His renal fxn now close to baseline, he failed voiding trials on d/c so he is being d/c with foley with out patient urology f/u. His abdomial pain resolved with bowel movements. Pt doing well now and stable for d/c.     Consults  general surgery, nephorlogy, urology  Significant Tests:  See full reports for all details      Ct Abdomen Pelvis Wo Contrast  02/13/2015   CLINICAL DATA:  Abdominal distension. Cholecystectomy 6 days ago. Constipation, leukocytosis, and dysuria.  EXAM: CT ABDOMEN AND PELVIS WITHOUT CONTRAST  TECHNIQUE: Multidetector CT imaging of the abdomen and pelvis was performed following the standard protocol without IV contrast.  COMPARISON:  02/05/2015  FINDINGS: 4 mm nodule in the right middle lobe is unchanged (series 4, image 7). 3 mm right lower lobe  subpleural nodule is also unchanged (series 4, image 7). Subsegmental atelectasis is noted in both lung bases. Three-vessel coronary artery calcification is noted.  Sequelae of interval cholecystectomy are identified with minimal stranding/soft tissue thickening in the gallbladder fossa. No gallbladder fossa fluid collection is seen. The liver, spleen, and pancreas are unremarkable. Minimal bilateral adrenal gland thickening is unchanged. Bilateral renal atrophy is again seen with unchanged low-density renal lesions bilaterally compatible with cysts. There is mild bilateral hydronephrosis. There is mild bilateral perinephric stranding which extends inferiorly in the retroperitoneum, greater on the left particularly adjacent to the left ureter and increased from the prior study.  A small amount of contrast is noted in the distal esophagus, possibly representing reflux. Oral contrast is present in multiple nondilated loops of small bowel. There is no evidence of bowel obstruction. There is also some residual oral contrast in the colon. The appendix is unremarkable. There is prominent distention of the bladder.  Moderate aortic atherosclerosis is noted. Mild postsurgical changes are noted in the midline of the lower abdomen. The prostate is mildly enlarged. No free fluid or enlarged lymph nodes are identified. Thoracolumbar spondylosis is noted. There is grade 1 anterolisthesis of L5 on S1 which appears facet mediated.  IMPRESSION: 1. Mild bilateral hydronephrosis likely secondary to prominent bladder distention. Mildly increased retroperitoneal stranding, particularly adjacent to the inferior left kidney and about the left ureter, nonspecific however upper tract infection is a consideration. 2. Interval cholecystectomy.   Electronically Signed   By: Logan Bores   On: 02/13/2015 16:36   Ct Abdomen Pelvis Wo Contrast  02/05/2015  CLINICAL DATA:  Right-sided abdominal pain for 2 days  EXAM: CT ABDOMEN AND PELVIS  WITHOUT CONTRAST  TECHNIQUE: Multidetector CT imaging of the abdomen and pelvis was performed following the standard protocol without IV contrast.  COMPARISON:  None.  FINDINGS: The lung bases are free of acute infiltrate or sizable effusion. Minimal basilar atelectasis is noted. A few tiny subpleural nodules are noted. These measure less than 2 mm.  The liver, spleen, adrenal glands and pancreas are within normal limits. The gallbladder is well distended and demonstrates multiple gallstones with changes suggestive of gallbladder wall thickening and mild. Cholecystic inflammatory change.  The kidneys are well visualized and demonstrates some cystic change consistent with the given clinical history of end-stage renal disease. No obstructive changes are noted bilaterally. The bladder is partially distended. The prostate is within normal limits. No pelvic mass lesion is noted. The appendix is within normal limits. Bony structures show degenerative change without acute abnormality.  IMPRESSION: Cholelithiasis with gallbladder wall thickening and pericholecystic inflammatory change suspicious for acute cholecystitis.  Cystic change in the kidneys consistent with the patient's given clinical history of renal disease.  Scattered subpleural nodules measuring less than 5 mm. If the patient is at high risk for bronchogenic carcinoma, follow-up chest CT at 1 year is recommended. If the patient is at low risk, no follow-up is needed. This recommendation follows the consensus statement: Guidelines for Management of Small Pulmonary Nodules Detected on CT Scans: A Statement from the [place] as published in Radiology 2005; 237:395-400.   Electronically Signed   By: Inez Catalina M.D.   On: 02/05/2015 22:07   Dg Abd 1 View  02/13/2015   CLINICAL DATA:  Gallbladder surgery 1 week ago with urinary retention.  EXAM: ABDOMEN - 1 VIEW  COMPARISON:  Abdominal CT 02/05/2015  FINDINGS: Normal bowel gas pattern. Previously  administered oral contrast is still visible in the nondilated colon. There are changes of cholecystectomy, which was recent. No concerning intra-abdominal mass effect or calcification. The bladder shadow is visible, bladder volume may be better estimated by sonography.  IMPRESSION: 1. Nonobstructive bowel gas pattern. 2. Visible bladder shadow. Bladder volume would be better estimated by sonography.   Electronically Signed   By: Monte Fantasia M.D.   On: 02/13/2015 11:38   US Renal  02/13/2015   CLINICAL DATA:  Acute renal failure.  EXAM: RENAL / URINARY TRACT ULTRASOUND COMPLETE  COMPARISON:  CT abdomen and pelvis 02/05/2015. Renal ultrasound 07/23/2010.  FINDINGS: Right Kidney:  Length: 13.9 cm. Cortical thinning. Several cysts were noted, the largest measuring 4.0 cm in the interpolar region/ lower pole. No definite solid mass or hydronephrosis.  Left Kidney:  Length: 15.0 cm. Cortical thinning. Two cysts were noted, the larger measuring 3.0 cm in the interpolar kidney. No hydronephrosis.  Bladder:  Appears normal for degree of bladder distention. Prevoid volume calculated at 1008 cc. Patient was unable to void.  IMPRESSION: 1. Bilateral renal cortical thinning and cysts.  No hydronephrosis. 2. Bladder volume as above.  Patient unable to void.   Electronically Signed   By: Logan Bores   On: 02/13/2015 15:53   Dg Abd Portable 1v  02/05/2015   CLINICAL DATA:  Nausea, vomiting, and abdominal pain for 2 days.  EXAM: PORTABLE ABDOMEN - 1 VIEW  COMPARISON:  None.  FINDINGS: Portable upright view the abdomen demonstrates no evidence of free intra-abdominal air. There is air throughout small and large bowel loops in the abdomen. No definite dilated bowel loops  to suggest obstruction. The pelvis is excluded from the field of view.  IMPRESSION: Nonobstructive bowel gas pattern.  No free intra-abdominal air.   Electronically Signed   By: Jeb Levering M.D.   On: 02/05/2015 21:43       Today   Subjective:    Corey Johnson better wants to go home  Objective:   Blood pressure 158/70, pulse 74, temperature 97.8 F (36.6 C), temperature source Oral, resp. rate 18, height 5\' 9"  (1.753 m), weight 111.131 kg (245 lb), SpO2 95 %.  .  Intake/Output Summary (Last 24 hours) at 02/17/15 1618 Last data filed at 02/17/15 1358  Gross per 24 hour  Intake   1660 ml  Output   1700 ml  Net    -40 ml    Exam VITAL SIGNS: Blood pressure 158/70, pulse 74, temperature 97.8 F (36.6 C), temperature source Oral, resp. rate 18, height 5\' 9"  (1.753 m), weight 111.131 kg (245 lb), SpO2 95 %.  GENERAL:  74 y.o.-year-old patient lying in the bed with no acute distress.  EYES: Pupils equal, round, reactive to light and accommodation. No scleral icterus. Extraocular muscles intact.  HEENT: Head atraumatic, normocephalic. Oropharynx and nasopharynx clear.  NECK:  Supple, no jugular venous distention. No thyroid enlargement, no tenderness.  LUNGS: Normal breath sounds bilaterally, no wheezing, rales,rhonchi or crepitation. No use of accessory muscles of respiration.  CARDIOVASCULAR: S1, S2 normal. No murmurs, rubs, or gallops.  ABDOMEN: Soft, nontender, nondistended. Bowel sounds present. No organomegaly or mass.  EXTREMITIES: No pedal edema, cyanosis, or clubbing.  NEUROLOGIC: Cranial nerves II through XII are intact. Muscle strength 5/5 in all extremities. Sensation intact. Gait not checked.  PSYCHIATRIC: The patient is alert and oriented x 3.  SKIN: No obvious rash, lesion, or ulcer.   Data Review     CBC w Diff: Lab Results  Component Value Date   WBC 13.4* 02/15/2015   WBC 11.1* 09/11/2014   HGB 9.8* 02/15/2015   HGB 9.6* 09/11/2014   HCT 30.1* 02/15/2015   HCT 29.1* 09/11/2014   PLT 497* 02/15/2015   PLT 260 09/11/2014   LYMPHOPCT 4% 02/05/2015   LYMPHOPCT 9.6 09/11/2014   MONOPCT 7% 02/05/2015   MONOPCT 10.1 09/11/2014   EOSPCT 1% 02/05/2015   EOSPCT 3.7 09/11/2014   BASOPCT 0%  02/05/2015   BASOPCT 0.3 09/11/2014   CMP: Lab Results  Component Value Date   NA 142 02/17/2015   NA 134* 09/09/2014   K 3.5 02/17/2015   K 4.3 09/09/2014   CL 106 02/17/2015   CL 100 09/09/2014   CO2 27 02/17/2015   CO2 28 09/09/2014   BUN 15 02/17/2015   BUN 17 09/09/2014   CREATININE 1.64* 02/17/2015   CREATININE 1.59* 09/09/2014   PROT 7.1 02/13/2015   PROT 7.9 08/04/2012   ALBUMIN 2.7* 02/13/2015   ALBUMIN 4.1 08/04/2012   BILITOT 0.4 02/13/2015   ALKPHOS 70 02/13/2015   ALKPHOS 107 08/04/2012   AST 24 02/13/2015   AST 23 08/04/2012   ALT 29 02/13/2015   ALT 20 08/04/2012  .  Micro Results No results found for this or any previous visit (from the past 240 hour(s)).      Code Status Orders        Start     Ordered   02/13/15 1727  Full code   Continuous     02/13/15 1726          Follow-up Information  Follow up with [facility], MD. Go on 02/23/2015.   Specialty:  Internal Medicine   Why:  at 3pm.    Contact information:   [place] [address] [phone]       Follow up with Royston Cowper, MD. Go on 02/24/2015.   Specialty:  Urology   Why:  at [place] Plan information:   [place] [id number] [phone]       Follow up with [place] On 02/22/2015.   Why:  at 11:30am for hospital follow-up   Contact information:   [address] [place], [place] [address] [phone]      Discharge Medications     Medication List    STOP taking these medications        hydrochlorothiazide 25 MG tablet  Commonly known as:  HYDRODIURIL      TAKE these medications        Cholecalciferol 1000 UNITS tablet  Take 1,000 Units by mouth daily.     cholestyramine 4 G packet  Commonly known as:  QUESTRAN  Take 4 g by mouth 3 (three) times daily as needed (for diarrhea).     colchicine 0.6 MG tablet  Take 0.6 mg by mouth daily as needed (for gout).      diltiazem 180 MG 24 hr capsule  Commonly known as:  CARDIZEM CD  Take 180 mg by mouth daily.     docusate sodium 100 MG capsule  Commonly known as:  COLACE  Take 1 capsule (100 mg total) by mouth 2 (two) times daily.     enalapril 20 MG tablet  Commonly known as:  VASOTEC  Take 20 mg by mouth daily.     febuxostat 40 MG tablet  Commonly known as:  ULORIC  Take 40 mg by mouth daily.     omeprazole 40 MG capsule  Commonly known as:  PRILOSEC  Take 40 mg by mouth daily.     oxyCODONE-acetaminophen 5-325 MG per tablet  Commonly known as:  PERCOCET/ROXICET  Take 1-2 tablets by mouth every 4 (four) hours as needed for moderate pain.     pravastatin 10 MG tablet  Commonly known as:  PRAVACHOL  Take 10 mg by mouth at bedtime.     rOPINIRole 1 MG tablet  Commonly known as:  REQUIP  Take 1-2 mg by mouth at bedtime.     terazosin 5 MG capsule  Commonly known as:  HYTRIN  Take 5 mg by mouth daily.           Total Time in preparing paper work, data evaluation and todays exam - 35 minutes  Dustin Flock M.D on 02/17/2015 at [place] PM  [facility]   Office  [phone]

## 2015-02-17 NOTE — Discharge Instructions (Signed)
°  DIET:  Cardiac diet  DISCHARGE CONDITION:  Good  ACTIVITY:  Activity as tolerated  OXYGEN:  Home Oxygen: No.   Oxygen Delivery: room air  DISCHARGE LOCATION:  home    ADDITIONAL DISCHARGE INSTRUCTION: Keep foley in until seen by urology   If you experience worsening of your admission symptoms, develop shortness of breath, life threatening emergency, suicidal or homicidal thoughts you must seek medical attention immediately by calling 911 or calling your MD immediately  if symptoms less severe.  You Must read complete instructions/literature along with all the possible adverse reactions/side effects for all the Medicines you take and that have been prescribed to you. Take any new Medicines after you have completely understood and accpet all the possible adverse reactions/side effects.   Please note  You were cared for by a hospitalist during your hospital stay. If you have any questions about your discharge medications or the care you received while you were in the hospital after you are discharged, you can call the unit and asked to speak with the hospitalist on call if the hospitalist that took care of you is not available. Once you are discharged, your primary care physician will handle any further medical issues. Please note that NO REFILLS for any discharge medications will be authorized once you are discharged, as it is imperative that you return to your primary care physician (or establish a relationship with a primary care physician if you do not have one) for your aftercare needs so that they can reassess your need for medications and monitor your lab values.

## 2015-02-17 NOTE — Progress Notes (Signed)
Notified Dr Dustin Flock that pt has been unable to void following foley removal this AM; bladder scan = 462ml, and pt unable to void. Dr ordered to insert foley, and for pt to be d/c with foley.

## 2015-02-20 NOTE — Addendum Note (Signed)
Addendum  created 02/20/15 1015 by Molli Barrows, MD   Modules edited: Anesthesia Events, Narrator   Narrator:  Narrator: Event Log Edited

## 2015-02-24 ENCOUNTER — Encounter: Payer: Self-pay | Admitting: *Deleted

## 2015-03-15 ENCOUNTER — Encounter
Admission: RE | Admit: 2015-03-15 | Discharge: 2015-03-15 | Disposition: A | Discharge status: Home / Self Care | Payer: Medicare HMO | Source: Ambulatory Visit | Attending: Urology | Admitting: Urology

## 2015-03-15 DIAGNOSIS — Z01812 Encounter for preprocedural laboratory examination: Secondary | ICD-10-CM | POA: Insufficient documentation

## 2015-03-15 LAB — CBC
HEMATOCRIT: 34.2 % — AB (ref 40.0–52.0)
HEMOGLOBIN: 10.8 g/dL — AB (ref 13.0–18.0)
MCH: 25.4 pg — ABNORMAL LOW (ref 26.0–34.0)
MCHC: 31.7 g/dL — AB (ref 32.0–36.0)
MCV: 79.9 fL — ABNORMAL LOW (ref 80.0–100.0)
Platelets: 318 10*3/uL (ref 150–440)
RBC: 4.27 MIL/uL — ABNORMAL LOW (ref 4.40–5.90)
RDW: 15.3 % — AB (ref 11.5–14.5)
WBC: 13.9 10*3/uL — ABNORMAL HIGH (ref 3.8–10.6)

## 2015-03-15 NOTE — H&P (Deleted)
NAME:  Corey Johnson, Corey Johnson NO.:  [id number]  MEDICAL RECORD NO.:  [id number]  LOCATION:  [facility]                         FACILITY:  [facility]  PHYSICIAN:  Maryan Puls          DATE OF BIRTH:  [birth year]  DATE OF ADMISSION:  03/28/2015 DATE OF DISCHARGE:  03/28/2015                            HISTORY AND PHYSICAL   Surgery scheduled on March 28, 2015.  CHIEF COMPLAINT:  Inability to urinate.  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  He has been on Avodart and tamsulosin and has now been avoided at all since surgery.  He currently performs self-catheterization three times per day and essentially has residuals of 100% of his urine output. He comes in now for photovaporization of prostate with a GreenLight laser.  The patient was also noted to have an elevated PSA of 8.61 ng/mL on February 24, 2015.  PAST MEDICAL HISTORY:  No drug allergies.  CURRENT MEDICATIONS:  Included diltiazem ER, vitamin D3, hydrochlorothiazide, enalapril, Uloric, terazosin, pravastatin, omeprazole, ropinirole, Avodart and tamsulosin.  PREVIOUS SURGICAL PROCEDURE:  TUMT in 2005, left total knee replacement in 2015 and cholecystectomy on Feb 07, 2015.  SOCIAL HISTORY:  The patient had tobacco or alcohol use.  He quit smoking in 1965 with a 15-pack year history.  FAMILY HISTORY:  The patient had a brother, who died of colon cancer at age 32.  PAST AND CURRENT MEDICAL CONDITIONS: 1. Hypertension. 2. Hypercholesterolemia. 3. Chronic renal disease. 4. Gastroesophageal reflux disease. 5. Gout. 6. Restless legs syndrome.  REVIEW OF SYSTEMS:  The patient denies chest pain, shortness of breath, diabetes, or stroke.  PHYSICAL EXAMINATION:  GENERAL:  Well-nourished white male in no distress. HEENT:  Sclerae were clear.  Pupils  were equally round and reactive to light and accommodation.  Extraocular movements were intact. NECK:  Supple.  No palpable masses or tenderness.  Thyroid gland was smooth without palpable nodules.  No audible carotid bruits. LUNGS:  Clear to auscultation. CARDIOVASCULAR:  Regular rhythm and rate without audible murmurs. ABDOMEN:  Soft, nontender abdomen. GU:  Uncircumcised, testes atrophic, 10 cm in size. RECTAL:  50 g smooth and nontender prostate. NEUROMUSCULAR:  Alert and oriented x3.  LABORATORY DATA:  CT scan report, dated February 13, 2015, indicated bilateral hydronephrosis with bladder distention.  Creatinine was 1.64 mg/dL on February 17, 2015.  IMPRESSION: 1. Acute urinary retention. 2. Benign prostatic hypertrophy with bladder outlet obstruction. 3. Elevated PSA. 4. Renal insufficiency.  PLAN:  Photovaporization of prostate with a GreenLight laser.          ______________________________ Maryan Puls     MW/MEDQ  D:  03/15/2015  T:  03/15/2015  Job:  [id number]

## 2015-03-15 NOTE — Patient Instructions (Addendum)
  Your procedure is scheduled on: Tuesday March 28, 2015 Report to Day Surgery. MEDICAL MALL ENTRANCE To find out your arrival time please call [phone] between 1PM - 3PM on Monday March 27, 2015.  Remember: Instructions that are not followed completely may result in serious medical risk, up to and including death, or upon the discretion of your surgeon and anesthesiologist your surgery may need to be rescheduled.    __X__ 1. Do not eat food or drink liquids after midnight. No gum chewing or hard candies.     __X__ 2. No Alcohol for 24 hours before or after surgery.   ____ 3. Bring all medications with you on the day of surgery if instructed.    ___X_ 4. Notify your doctor if there is any change in your medical condition     (cold, fever, infections).     Do not wear jewelry, make-up, hairpins, clips or nail polish.  Do not wear lotions, powders, or perfumes.   Do not shave 48 hours prior to surgery. Men may shave face and neck.  Do not bring valuables to the hospital.    [facility] is not responsible for any belongings or valuables.               Contacts, dentures or bridgework may not be worn into surgery.  Leave your suitcase in the car. After surgery it may be brought to your room.  For patients admitted to the hospital, discharge time is determined by your                treatment team.   Patients discharged the day of surgery will not be allowed to drive home.   Please read over the following fact sheets that you were given:   Surgical Site Infection Prevention   _X_ Take these medicines the morning of surgery with A SIP OF WATER:    1. OMEPRAZOLE  2. LEVOTHYROXINE  3.   4.  5.  6.  ____ Fleet Enema (as directed)   __X__ Use CHG Soap as directed  SHOWER WITH AN ANTIBACTERIAL SOAP LIKE DIAL OR SAFEGUARD  ____ Use inhalers on the day of surgery  ____ Stop metformin 2 days prior to surgery    ____ Take 1/2 of usual insulin dose the night before surgery and none  on the morning of surgery.   __X__ Stop Coumadin/Plavix/aspirin on    __X__ Stop Anti-inflammatories on    ____ Stop supplements until after surgery.    __X__ Bring C-Pap to the hospital.

## 2015-03-15 NOTE — H&P (Signed)
NAME:  Corey Johnson, Corey Johnson NO.:  [id number]  MEDICAL RECORD NO.:  [id number]  LOCATION:  [facility]                         FACILITY:  [facility]  PHYSICIAN:  Maryan Puls          DATE OF BIRTH:  05-Feb-1941  DATE OF ADMISSION:  03/28/2015 DATE OF DISCHARGE:  03/28/2015                            HISTORY AND PHYSICAL   Surgery scheduled on March 28, 2015.  CHIEF COMPLAINT:  Inability to urinate.  HISTORY OF PRESENT ILLNESS:  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  He has been on Avodart and tamsulosin and has now been avoided at all since surgery.  He currently performs self-catheterization three times per day and essentially has residuals of 100% of his urine output. He comes in now for photovaporization of prostate with a GreenLight laser.  The patient was also noted to have an elevated PSA of 8.61 ng/mL on February 24, 2015.  PAST MEDICAL HISTORY:  No drug allergies.  CURRENT MEDICATIONS:  Included diltiazem ER, vitamin D3, hydrochlorothiazide, enalapril, Uloric, terazosin, pravastatin, omeprazole, ropinirole, Avodart and tamsulosin.  PREVIOUS SURGICAL PROCEDURE:  TUMT in 2005, left total knee replacement in 2015 and cholecystectomy on Feb 07, 2015.  SOCIAL HISTORY:  The patient had tobacco or alcohol use.  He quit smoking in 1965 with a 15-pack year history.  FAMILY HISTORY:  The patient had a brother, who died of colon cancer at age 98.  PAST AND CURRENT MEDICAL CONDITIONS: 1. Hypertension. 2. Hypercholesterolemia. 3. Chronic renal disease. 4. Gastroesophageal reflux disease. 5. Gout. 6. Restless legs syndrome.  REVIEW OF SYSTEMS:  The patient denies chest pain, shortness of breath, diabetes, or stroke.  PHYSICAL EXAMINATION:  GENERAL:  Well-nourished white male in no distress. HEENT:  Sclerae were clear.  Pupils  were equally round and reactive to light and accommodation.  Extraocular movements were intact. NECK:  Supple.  No palpable masses or tenderness.  Thyroid gland was smooth without palpable nodules.  No audible carotid bruits. LUNGS:  Clear to auscultation. CARDIOVASCULAR:  Regular rhythm and rate without audible murmurs. ABDOMEN:  Soft, nontender abdomen. GU:  Uncircumcised, testes atrophic, 10 cm in size. RECTAL:  50 g smooth and nontender prostate. NEUROMUSCULAR:  Alert and oriented x3.  LABORATORY DATA:  CT scan report, dated February 13, 2015, indicated bilateral hydronephrosis with bladder distention.  Creatinine was 1.64 mg/dL on February 17, 2015.  IMPRESSION: 1. Acute urinary retention. 2. Benign prostatic hypertrophy with bladder outlet obstruction. 3. Elevated PSA. 4. Renal insufficiency.  PLAN:  Photovaporization of prostate with a GreenLight laser.          ______________________________ Maryan Puls     MW/MEDQ  D:  03/15/2015  T:  03/15/2015  Job:  [id number]

## 2015-03-16 NOTE — OR Nursing (Signed)
Abnormal CBC faxed to Dr. Yves Dill Office, telephone notification to office staff member.

## 2015-03-28 ENCOUNTER — Ambulatory Visit: Payer: Medicare HMO | Admitting: Anesthesiology

## 2015-03-28 ENCOUNTER — Ambulatory Visit
Admission: RE | Admit: 2015-03-28 | Discharge: 2015-03-28 | Disposition: A | Discharge status: Home / Self Care | Payer: Medicare HMO | Source: Ambulatory Visit | Attending: Urology | Admitting: Urology

## 2015-03-28 ENCOUNTER — Encounter
Admission: RE | Disposition: A | Discharge status: Home / Self Care | Payer: Self-pay | Source: Ambulatory Visit | Attending: Urology

## 2015-03-28 ENCOUNTER — Encounter: Payer: Self-pay | Admitting: *Deleted

## 2015-03-28 DIAGNOSIS — Z79899 Other long term (current) drug therapy: Secondary | ICD-10-CM | POA: Diagnosis not present

## 2015-03-28 DIAGNOSIS — R338 Other retention of urine: Secondary | ICD-10-CM | POA: Insufficient documentation

## 2015-03-28 DIAGNOSIS — Z9049 Acquired absence of other specified parts of digestive tract: Secondary | ICD-10-CM | POA: Diagnosis not present

## 2015-03-28 DIAGNOSIS — M109 Gout, unspecified: Secondary | ICD-10-CM | POA: Insufficient documentation

## 2015-03-28 DIAGNOSIS — Z96652 Presence of left artificial knee joint: Secondary | ICD-10-CM | POA: Insufficient documentation

## 2015-03-28 DIAGNOSIS — N289 Disorder of kidney and ureter, unspecified: Secondary | ICD-10-CM | POA: Insufficient documentation

## 2015-03-28 DIAGNOSIS — I10 Essential (primary) hypertension: Secondary | ICD-10-CM | POA: Diagnosis not present

## 2015-03-28 DIAGNOSIS — K219 Gastro-esophageal reflux disease without esophagitis: Secondary | ICD-10-CM | POA: Diagnosis not present

## 2015-03-28 DIAGNOSIS — R339 Retention of urine, unspecified: Secondary | ICD-10-CM | POA: Diagnosis present

## 2015-03-28 DIAGNOSIS — E780 Pure hypercholesterolemia: Secondary | ICD-10-CM | POA: Insufficient documentation

## 2015-03-28 DIAGNOSIS — G2581 Restless legs syndrome: Secondary | ICD-10-CM | POA: Insufficient documentation

## 2015-03-28 DIAGNOSIS — N401 Enlarged prostate with lower urinary tract symptoms: Secondary | ICD-10-CM | POA: Diagnosis not present

## 2015-03-28 DIAGNOSIS — N400 Enlarged prostate without lower urinary tract symptoms: Secondary | ICD-10-CM

## 2015-03-28 DIAGNOSIS — Z87891 Personal history of nicotine dependence: Secondary | ICD-10-CM | POA: Insufficient documentation

## 2015-03-28 HISTORY — PX: GREEN LIGHT LASER TURP (TRANSURETHRAL RESECTION OF PROSTATE: SHX6260

## 2015-03-28 SURGERY — GREEN LIGHT LASER TURP (TRANSURETHRAL RESECTION OF PROSTATE
Anesthesia: General | Site: Prostate | Wound class: Clean Contaminated

## 2015-03-28 MED ORDER — NEOSTIGMINE METHYLSULFATE 10 MG/10ML IV SOLN
INTRAVENOUS | Status: DC | PRN
  Administered 2015-03-28: 3 mg via INTRAVENOUS

## 2015-03-28 MED ORDER — LEVOFLOXACIN IN D5W 500 MG/100ML IV SOLN
INTRAVENOUS | Status: AC
  Administered 2015-03-28: 500 mg via INTRAVENOUS
  Filled 2015-03-28: qty 100

## 2015-03-28 MED ORDER — FENTANYL CITRATE (PF) 100 MCG/2ML IJ SOLN
INTRAMUSCULAR | Status: DC | PRN
  Administered 2015-03-28: 150 ug via INTRAVENOUS
  Administered 2015-03-28: 100 ug via INTRAVENOUS

## 2015-03-28 MED ORDER — GLYCOPYRROLATE 0.2 MG/ML IJ SOLN
INTRAMUSCULAR | Status: DC | PRN
  Administered 2015-03-28: .5 mg via INTRAVENOUS

## 2015-03-28 MED ORDER — PROMETHAZINE HCL 25 MG/ML IJ SOLN
INTRAMUSCULAR | Status: AC
  Filled 2015-03-28: qty 1

## 2015-03-28 MED ORDER — ACETAMINOPHEN 10 MG/ML IV SOLN
INTRAVENOUS | Status: AC
  Filled 2015-03-28: qty 100

## 2015-03-28 MED ORDER — ONDANSETRON HCL 4 MG/2ML IJ SOLN
INTRAMUSCULAR | Status: DC | PRN
  Administered 2015-03-28: 4 mg via INTRAVENOUS

## 2015-03-28 MED ORDER — PROMETHAZINE HCL 25 MG/ML IJ SOLN
6.2500 mg | INTRAMUSCULAR | Status: DC | PRN
Start: 2015-03-28 — End: 2015-03-28
  Administered 2015-03-28: 6.25 mg via INTRAVENOUS

## 2015-03-28 MED ORDER — LEVOFLOXACIN 500 MG PO TABS: 500.0000 mg | ORAL_TABLET | Freq: Every day | ORAL | Status: DC

## 2015-03-28 MED ORDER — ONDANSETRON HCL 4 MG/2ML IJ SOLN
INTRAMUSCULAR | Status: AC
  Filled 2015-03-28: qty 2

## 2015-03-28 MED ORDER — ROCURONIUM BROMIDE 100 MG/10ML IV SOLN
INTRAVENOUS | Status: DC | PRN
  Administered 2015-03-28: 35 mg via INTRAVENOUS

## 2015-03-28 MED ORDER — URIBEL 118 MG PO CAPS: 1.0000 | ORAL_CAPSULE | Freq: Four times a day (QID) | ORAL | Status: DC | PRN

## 2015-03-28 MED ORDER — MIDAZOLAM HCL 2 MG/2ML IJ SOLN
INTRAMUSCULAR | Status: DC | PRN
  Administered 2015-03-28: 1 mg via INTRAVENOUS

## 2015-03-28 MED ORDER — LIDOCAINE HCL 2 % EX GEL
CUTANEOUS | Status: DC | PRN
  Administered 2015-03-28: 1 via URETHRAL

## 2015-03-28 MED ORDER — LIDOCAINE HCL (CARDIAC) 20 MG/ML IV SOLN
INTRAVENOUS | Status: DC | PRN
  Administered 2015-03-28: 100 mg via INTRAVENOUS

## 2015-03-28 MED ORDER — FENTANYL CITRATE (PF) 100 MCG/2ML IJ SOLN
25.0000 ug | INTRAMUSCULAR | Status: DC | PRN
  Administered 2015-03-28 (×3): 25 ug via INTRAVENOUS

## 2015-03-28 MED ORDER — BELLADONNA ALKALOIDS-OPIUM 16.2-60 MG RE SUPP
RECTAL | Status: AC
  Filled 2015-03-28: qty 1

## 2015-03-28 MED ORDER — LEVOFLOXACIN IN D5W 500 MG/100ML IV SOLN
500.0000 mg | Freq: Once | INTRAVENOUS | Status: AC
  Administered 2015-03-28: 500 mg via INTRAVENOUS

## 2015-03-28 MED ORDER — ONDANSETRON 8 MG PO TBDP: 8.0000 mg | ORAL_TABLET | Freq: Four times a day (QID) | ORAL | Status: DC | PRN

## 2015-03-28 MED ORDER — SUCCINYLCHOLINE CHLORIDE 20 MG/ML IJ SOLN
INTRAMUSCULAR | Status: DC | PRN
  Administered 2015-03-28: 100 mg via INTRAVENOUS

## 2015-03-28 MED ORDER — PROPOFOL 10 MG/ML IV BOLUS
INTRAVENOUS | Status: DC | PRN
  Administered 2015-03-28: 200 mg via INTRAVENOUS

## 2015-03-28 MED ORDER — NUCYNTA 50 MG PO TABS: 50.0000 mg | ORAL_TABLET | Freq: Four times a day (QID) | ORAL | Status: DC | PRN

## 2015-03-28 MED ORDER — BELLADONNA ALKALOIDS-OPIUM 16.2-60 MG RE SUPP
RECTAL | Status: DC | PRN
  Administered 2015-03-28: 1 via RECTAL

## 2015-03-28 MED ORDER — LIDOCAINE HCL 2 % EX GEL
CUTANEOUS | Status: AC
  Filled 2015-03-28: qty 10

## 2015-03-28 MED ORDER — OXYCODONE HCL 5 MG/5ML PO SOLN: 5.0000 mg | Freq: Once | ORAL | Status: DC | PRN

## 2015-03-28 MED ORDER — ACETAMINOPHEN 10 MG/ML IV SOLN
INTRAVENOUS | Status: DC | PRN
  Administered 2015-03-28: 1000 mg via INTRAVENOUS

## 2015-03-28 MED ORDER — PHENYLEPHRINE HCL 10 MG/ML IJ SOLN
INTRAMUSCULAR | Status: DC | PRN
  Administered 2015-03-28 (×2): 200 ug via INTRAVENOUS

## 2015-03-28 MED ORDER — SODIUM CHLORIDE 0.9 % IV SOLN
INTRAVENOUS | Status: DC
  Administered 2015-03-28 (×2): via INTRAVENOUS

## 2015-03-28 MED ORDER — FENTANYL CITRATE (PF) 100 MCG/2ML IJ SOLN
INTRAMUSCULAR | Status: AC
  Administered 2015-03-28: 25 ug via INTRAVENOUS
  Filled 2015-03-28: qty 2

## 2015-03-28 MED ORDER — OXYCODONE HCL 5 MG PO TABS: 5.0000 mg | ORAL_TABLET | Freq: Once | ORAL | Status: DC | PRN

## 2015-03-28 SURGICAL SUPPLY — 26 items
ADAPTER IRRIG TUBE 2 SPIKE SOL (ADAPTER) ×4 IMPLANT
BAG URO DRAIN 2000ML W/SPOUT (MISCELLANEOUS) ×2 IMPLANT
CATH FOLEY 2WAY  5CC 20FR SIL (CATHETERS) ×1
CATH FOLEY 2WAY 5CC 20FR SIL (CATHETERS) ×1 IMPLANT
GLOVE BIO SURGEON STRL SZ7 (GLOVE) ×6 IMPLANT
GLOVE BIO SURGEON STRL SZ7.5 (GLOVE) ×2 IMPLANT
GOWN STRL REUS W/ TWL LRG LVL3 (GOWN DISPOSABLE) ×1 IMPLANT
GOWN STRL REUS W/ TWL XL LVL3 (GOWN DISPOSABLE) ×1 IMPLANT
GOWN STRL REUS W/TWL LRG LVL3 (GOWN DISPOSABLE) ×1
GOWN STRL REUS W/TWL XL LVL3 (GOWN DISPOSABLE) ×1
IV NS 1000ML (IV SOLUTION) ×1
IV NS 1000ML BAXH (IV SOLUTION) ×1 IMPLANT
IV SET PRIMARY 15D 139IN B9900 (IV SETS) ×2 IMPLANT
JELLY LUB 2OZ STRL (MISCELLANEOUS) ×1
JELLY LUBE 2OZ STRL (MISCELLANEOUS) ×1 IMPLANT
KIT RM TURNOVER CYSTO AR (KITS) ×2 IMPLANT
LASER GRNLGT 950 (MISCELLANEOUS) ×2 IMPLANT
LASER GRNLGT MOXY FIBER 750UM (MISCELLANEOUS) ×2 IMPLANT
PACK CYSTO AR (MISCELLANEOUS) ×2 IMPLANT
PREP PVP WINGED SPONGE (MISCELLANEOUS) ×2 IMPLANT
SET IRRIG Y TYPE TUR BLADDER L (SET/KITS/TRAYS/PACK) ×2 IMPLANT
SOL PREP PVP 2OZ (MISCELLANEOUS) ×2
SOLUTION PREP PVP 2OZ (MISCELLANEOUS) ×1 IMPLANT
SYRINGE IRR TOOMEY STRL 70CC (SYRINGE) ×2 IMPLANT
TUBING CONNECTING 10 (TUBING) ×2 IMPLANT
WATER STERILE IRR 1000ML POUR (IV SOLUTION) ×2 IMPLANT

## 2015-03-28 NOTE — Discharge Instructions (Addendum)
Benign Prostatic Hyperplasia An enlarged prostate (benign prostatic hyperplasia) is common in older men. You may experience the following:  Weak urine stream.  Dribbling.  Feeling like the bladder has not emptied completely.  Difficulty starting urination.  Getting up frequently at night to urinate.  Urinating more frequently during the day. HOME CARE INSTRUCTIONS  Monitor your prostatic hyperplasia for any changes. The following actions may help to alleviate any discomfort you are experiencing:  Give yourself time when you urinate.  Stay away from alcohol.  Avoid beverages containing caffeine, such as coffee, tea, and colas, because they can make the problem worse.  Avoid decongestants, antihistamines, and some prescription medicines that can make the problem worse.  Follow up with your health care provider for further treatment as recommended. SEEK MEDICAL CARE IF:  You are experiencing progressive difficulty voiding.  Your urine stream is progressively getting narrower.  You are awaking from sleep with the urge to void more frequently.  You are constantly feeling the need to void.  You experience loss of urine, especially in small amounts. SEEK IMMEDIATE MEDICAL CARE IF:   You develop increased pain with urination or are unable to urinate.  You develop severe abdominal pain, vomiting, a high fever, or fainting.  You develop back pain or blood in your urine. MAKE SURE YOU:   Understand these instructions.  Will watch your condition.  Will get help right away if you are not doing well or get worse. Document Released: 08/26/2005 Document Revised: 04/28/2013 Document Reviewed: 01/26/2013 [facility] Patient Information 2015 [place], [place]. This information is not intended to replace advice given to you by your health care provider. Make sure you discuss any questions you have with your health care provider.  Prostate Laser Surgery Prostate laser surgery is a  procedure to eliminate prostate tissue. There are two types of prostate laser surgery: ablation (prostate tissue is melted away) and enucleation (prostate tissue is cut out). LET [facility] CARE PROVIDER KNOW ABOUT:  Any allergies you have.  All medicines you are taking, including vitamins, herbs, eye drops, creams, and over-the-counter medicines.  Previous problems you or members of your family have had with the use of anesthetics.  Any blood disorders you have.  Previous surgeries you have had.  Medical conditions you have. RISKS AND COMPLICATIONS  Generally prostate laser surgery is a safe procedure. However, as with any procedure, problems can occur. Possible problems include:  Bleeding and the need for a blood transfusion.   Urinary tract infection.  Erectile dysfunction.  Narrowing (scar or stricture) of the urethra, which blocks the flow of urine.  Dry ejaculation (semen is not released when you reach sexual climax). BEFORE THE PROCEDURE   If you are on blood thinners, such as warfarin or clopidogrel, or nonprescription pain-relieving medicines, such as naproxen sodium or ibuprofen, you may be asked to stop taking them before the procedure.  Your health care provider may ask you to start taking antibiotic medicines before the procedure as a precaution against a bacterial infection. The procedure will not be performed if your urine is infected.  You should have nothing to eat or drink for at least 8 hours before your procedure, or as suggested by your health care provider. You may have a sip of water to take medications not stopped for the procedure. PROCEDURE  You will be given one of the following:   A medicine that numbs the area (local anesthetic).  A medicine injected into your spine that numbs your body  below the waist (spinal anesthetic). A sedative is usually given with spinal anesthetic so you will be relaxed during the procedure. A viewing scope and  instruments will be placed in a tube that is inserted through your penis, so no incisions will be needed to insert the scope and instruments. Depending on the type of laser used, the prostate tissue will either be vaporized or cut away. The laser beam will coagulate any small bleeding areas. At the end of the surgery, a special tube will be inserted into your bladder to drain the urine from your bladder (urinary catheter). AFTER THE PROCEDURE You will be sent to the recovery room for a short time. In the recovery room, you will receive fluids through an IV tube inserted in one of your veins. Your blood pressure and pulse will be checked frequently to make sure that they stabilize. Once you are eating and drinking fluids appropriately, the IV tube will be removed.  Depending on your specific needs, you may be admitted to the hospital or you will be sent home after the procedure. If you are sent home:  You may be sent home with elastic support stockings to help prevent blood clots in your legs.  You will also probably be given an antibiotic medicine.  Unless told otherwise, you may restart your other medications.  You may be given a stool softener. Document Released: 08/26/2005 Document Revised: 08/31/2013 Document Reviewed: 02/15/2013 [facility] Patient Information 2015 [place], [place]. This information is not intended to replace advice given to you by your health care provider. Make sure you discuss any questions you have with your health care provider.  Benign Prostatic Hypertrophy The prostate gland is part of the reproductive system of men. A normal prostate is about the size and shape of a walnut. The prostate gland produces a fluid that is mixed with sperm to make semen. This gland surrounds the urethra and is located in front of the rectum and just below the bladder. The bladder is where urine is stored. The urethra is the tube through which urine passes from the bladder to get out of the  body. The prostate grows as a man ages. An enlarged prostate not caused by cancer is called benign prostatic hypertrophy (BPH). An enlarged prostate can press on the urethra. This can make it harder to pass urine. In the early stages of enlargement, the bladder can get by with a narrowed urethra by forcing the urine through. If the problem gets worse, medical or surgical treatment may be required.  This condition should be followed by your health care provider. The accumulation of urine in the bladder can cause infection. Back pressure and infection can progress to bladder damage and kidney (renal) failure. If needed, your health care provider may refer you to a specialist in kidney and prostate disease (urologist). CAUSES  BPH is a common health problem in men older than 50 years. This condition is a normal part of aging. However, not all men will develop problems from this condition. If the enlargement grows away from the urethra, then there will not be any compression of the urethra and resistance to urine flow.If the growth is toward the urethra and compresses it, you will experience difficulty urinating.  SYMPTOMS   Not able to completely empty your bladder.  Getting up often during the night to urinate.  Need to urinate frequently during the day.  Difficultly starting urine flow.  Decrease in size and strength of your urine stream.  Dribbling after  urination.  Pain on urination (more common with infection).  Inability to pass urine. This needs immediate treatment.  The development of a urinary tract infection. DIAGNOSIS  These tests will help your health care provider understand your problem:  A thorough history and physical examination.  A urination history, with the number of times you urinate, the amounts of urine, the strength of the urine stream, and the feeling of emptiness or fullness after urinating.  A postvoid bladder scan that measures any amount of urine that may  remain in your bladder after you finish urinating.  Digital rectal exam. In a rectal exam, your health care provider checks your prostate by putting a gloved, lubricated finger into your rectum to feel the back of your prostate gland. This exam detects the size of your gland and abnormal lumps or growths.  Exam of your urine (urinalysis).  Prostate specific antigen (PSA) screening. This is a blood test used to screen for prostate cancer.  Rectal ultrasonography. This test uses sound waves to electronically produce a picture of your prostate gland. TREATMENT  Once symptoms begin, your health care provider will monitor your condition. Of the men with this condition, one third will have symptoms that stabilize, one third will have symptoms that improve, and one third will have symptoms that progress in the first year. Mild symptoms may not need treatment. Simple observation and yearly exams may be all that is required. Medicines and surgery are options for more severe problems. Your health care provider can help you make an informed decision for what is best. Two classes of medicines are available for relief of prostate symptoms:  Medicines that shrink the prostate. This helps relieve symptoms. These medicines take time to work, and it may be months before any improvement is seen.  Uncommon side effects include problems with sexual function.  Medicines to relax the muscle of the prostate. This also relieves the obstruction by reducing any compression on the urethra.This group of medicines work much faster than those that reduce the size of the prostate gland. Usually, one can experience improvement in days to weeks..  Side effects can include dizziness, fatigue, lightheadedness, and retrograde ejaculation (diminished volume of ejaculate). Several types of surgical treatments are available for relief of prostate symptoms:  Transurethral resection of the prostate (TURP)--In this treatment, an  instrument is inserted through opening at the tip of the penis. It is used to cut away pieces of the inner core of the prostate. The pieces are removed through the same opening of the penis. This removes the obstruction and helps get rid of the symptoms.  Transurethral incision (TUIP)--In this procedure, small cuts are made in the prostate. This lessens the prostates pressure on the urethra.  Transurethral microwave thermotherapy (TUMT)--This procedure uses microwaves to create heat. The heat destroys and removes a small amount of prostate tissue.  Transurethral needle ablation (TUNA)--This is a procedure that uses radio frequencies to do the same as TUMT.  Interstitial laser coagulation (ILC)--This is a procedure that uses a laser to do the same as TUMT and TUNA.  Transurethral electrovaporization (TUVP)--This is a procedure that uses electrodes to do the same as the procedures listed above. SEEK MEDICAL CARE IF:   You develop a fever.  There is unexplained back pain.  Symptoms are not helped by medicines prescribed.  You develop side effects from the medicine you are taking.  Your urine becomes very dark or has a bad smell.  Your lower abdomen becomes distended and you  have difficulty passing your urine. SEEK IMMEDIATE MEDICAL CARE IF:   You are suddenly unable to urinate. This is an emergency. You should be seen immediately.  There are large amounts of blood or clots in the urine.  Your urinary problems become unmanageable.  You develop lightheadedness, severe dizziness, or you feel faint.  You develop moderate to severe low back or flank pain.  You develop chills or fever. Document Released: 08/26/2005 Document Revised: 08/31/2013 Document Reviewed: 03/11/2013 [facility] Patient Information 2015 [place], [place]. This information is not intended to replace advice given to you by your health care provider. Make sure you discuss any questions you have with your health care  provider.

## 2015-03-28 NOTE — Transfer of Care (Signed)
Immediate Anesthesia Transfer of Care Note  Patient: Corey Johnson  Procedure(s) Performed: Procedure(s): GREEN LIGHT LASER TURP (TRANSURETHRAL RESECTION OF PROSTATE (N/A)  Patient Location: PACU  Anesthesia Type:General  Level of Consciousness: awake, alert , oriented and patient cooperative  Airway & Oxygen Therapy: Patient Spontanous Breathing and Patient connected to face mask oxygen  Post-op Assessment: Report given to RN and Post -op Vital signs reviewed and stable  Post vital signs: Reviewed and stable  Last Vitals:  Filed Vitals:   03/28/15 1708  BP: 137/76  Pulse: 85  Temp: 37.3 C  Resp: 15    Complications: No apparent anesthesia complications

## 2015-03-28 NOTE — Anesthesia Procedure Notes (Signed)
Procedure Name: Intubation Date/Time: 03/28/2015 3:57 PM Performed by: Rosaria Ferries, Norbert Malkin Pre-anesthesia Checklist: Patient identified, Emergency Drugs available, Suction available and Patient being monitored Patient Re-evaluated:Patient Re-evaluated prior to inductionOxygen Delivery Method: Circle system utilized Preoxygenation: Pre-oxygenation with 100% oxygen Intubation Type: IV induction Ventilation: Mask ventilation without difficulty Laryngoscope Size: Mac and 3 Grade View: Grade II Tube type: Oral Tube size: 7.0 mm Airway Equipment and Method: Bougie stylet Dental Injury: Teeth and Oropharynx as per pre-operative assessment  Difficulty Due To: Difficult Airway- due to anterior larynx

## 2015-03-28 NOTE — Anesthesia Preprocedure Evaluation (Signed)
Anesthesia Evaluation  Patient identified by MRN, date of birth, ID band Patient awake    Reviewed: Allergy & Precautions, H&P , NPO status , Patient's Chart, lab work & pertinent test results, reviewed documented beta blocker date and time   Airway Mallampati: II  TM Distance: >3 FB Neck ROM: limited    Dental  (+) Poor Dentition, Chipped, Missing   Pulmonary sleep apnea and Continuous Positive Airway Pressure Ventilation , former smoker,  breath sounds clear to auscultation  Pulmonary exam normal       Cardiovascular Exercise Tolerance: Good hypertension, Normal cardiovascular examRhythm:regular Rate:Normal     Neuro/Psych negative neurological ROS  negative psych ROS   GI/Hepatic Neg liver ROS, GERD-  Controlled,  Endo/Other  negative endocrine ROS  Renal/GU CRFRenal disease  negative genitourinary   Musculoskeletal   Abdominal   Peds  Hematology negative hematology ROS (+)   Anesthesia Other Findings Past Medical History:   Arthritis                                                    Hypertension                                                 Renal disorder                                               Sleep apnea                                                    Comment:CPAP   Gout                                                         Psoriasis                                                    Vitamin D deficiency                                         BPH (benign prostatic hyperplasia)                           Hyperlipemia                                                 CRF (chronic  renal failure)                                  H. pylori infection      Daughter reports that patient had negative stress test in the past week                                      Reproductive/Obstetrics negative OB ROS                             Anesthesia Physical Anesthesia Plan  ASA:  III  Anesthesia Plan: General LMA   Post-op Pain Management:    Induction:   Airway Management Planned:   Additional Equipment:   Intra-op Plan:   Post-operative Plan:   Informed Consent: I have reviewed the patients History and Physical, chart, labs and discussed the procedure including the risks, benefits and alternatives for the proposed anesthesia with the patient or authorized representative who has indicated his/her understanding and acceptance.   Dental Advisory Given  Plan Discussed with: Anesthesiologist, CRNA and Surgeon  Anesthesia Plan Comments:         Anesthesia Quick Evaluation

## 2015-03-28 NOTE — Op Note (Signed)
Preoperative diagnosis: 1. Urinary retention                                            2. BPH with bladder outlet obstruction Postoperative diagnosis: Same  Procedure: Photovaporization of the prostate with greenlight laser Surgeon: Otelia Limes. Yves Dill MD, FACS Anesthesia: Gen.  Indications:See the history and physical. After informed consent the above procedure(s) were requested     Technique and findings: After adequate general anesthesia been obtained patient was placed into dorsal lithotomy position and the perineum was prepped and draped in the usual fashion. The laser scope was coupled to the camera and then visually advanced into the bladder. Bladder was moderately trabeculated. No bladder tumors were identified. Both ureteral orifices were identified and had clear reflux. Patient had trilobar BPH with visual obstruction. At this point the greenlight laser XPS fiber was introduced through the scope and set at 75 W of power. Bladder neck tissue and median lobe was vaporized. The power was then increased to 180 W and remaining obstructive tissue from the verumontanum to the bladder neck was vaporized. The laser scope was removed. 10 cc of viscous L cane was instilled within the urethra and the bladder. A 20 French silicone catheter was placed. The catheter was irrigated until clear. A B&O suppository was placed and the procedure was terminated. The patient was then transferred to the recovery room in stable condition. Estimated blood loss was 75 cc.

## 2015-03-28 NOTE — Anesthesia Postprocedure Evaluation (Signed)
  Anesthesia Post-op Note  Patient: Corey Johnson  Procedure(s) Performed: Procedure(s): GREEN LIGHT LASER TURP (TRANSURETHRAL RESECTION OF PROSTATE (N/A)  Anesthesia type:General LMA  Patient location: PACU  Post pain: Pain level controlled  Post assessment: Post-op Vital signs reviewed, Patient's Cardiovascular Status Stable, Respiratory Function Stable, Patent Airway and No signs of Nausea or vomiting  Post vital signs: Reviewed and stable  Last Vitals:  Filed Vitals:   03/28/15 1837  BP: 124/67  Pulse: 63  Temp:   Resp: 16    Level of consciousness: awake, alert  and patient cooperative  Complications: No apparent anesthesia complications

## 2015-03-29 ENCOUNTER — Encounter: Payer: Self-pay | Admitting: Urology

## 2015-04-22 ENCOUNTER — Encounter: Payer: Self-pay | Admitting: Emergency Medicine

## 2015-04-22 ENCOUNTER — Emergency Department: Payer: Medicare HMO

## 2015-04-22 ENCOUNTER — Inpatient Hospital Stay
Admission: EM | Admit: 2015-04-22 | Discharge: 2015-04-24 | DRG: 872 | Disposition: A | Discharge status: Home / Self Care | Payer: Medicare HMO | Attending: Internal Medicine | Admitting: Internal Medicine

## 2015-04-22 DIAGNOSIS — Z87891 Personal history of nicotine dependence: Secondary | ICD-10-CM | POA: Diagnosis not present

## 2015-04-22 DIAGNOSIS — K5900 Constipation, unspecified: Secondary | ICD-10-CM | POA: Diagnosis present

## 2015-04-22 DIAGNOSIS — M1990 Unspecified osteoarthritis, unspecified site: Secondary | ICD-10-CM | POA: Diagnosis present

## 2015-04-22 DIAGNOSIS — Z9889 Other specified postprocedural states: Secondary | ICD-10-CM

## 2015-04-22 DIAGNOSIS — M109 Gout, unspecified: Secondary | ICD-10-CM | POA: Diagnosis present

## 2015-04-22 DIAGNOSIS — Z79899 Other long term (current) drug therapy: Secondary | ICD-10-CM

## 2015-04-22 DIAGNOSIS — Z8249 Family history of ischemic heart disease and other diseases of the circulatory system: Secondary | ICD-10-CM

## 2015-04-22 DIAGNOSIS — R6510 Systemic inflammatory response syndrome (SIRS) of non-infectious origin without acute organ dysfunction: Secondary | ICD-10-CM

## 2015-04-22 DIAGNOSIS — N390 Urinary tract infection, site not specified: Secondary | ICD-10-CM | POA: Diagnosis present

## 2015-04-22 DIAGNOSIS — D638 Anemia in other chronic diseases classified elsewhere: Secondary | ICD-10-CM | POA: Diagnosis present

## 2015-04-22 DIAGNOSIS — N138 Other obstructive and reflux uropathy: Secondary | ICD-10-CM | POA: Diagnosis present

## 2015-04-22 DIAGNOSIS — N401 Enlarged prostate with lower urinary tract symptoms: Secondary | ICD-10-CM | POA: Diagnosis present

## 2015-04-22 DIAGNOSIS — I129 Hypertensive chronic kidney disease with stage 1 through stage 4 chronic kidney disease, or unspecified chronic kidney disease: Secondary | ICD-10-CM | POA: Diagnosis present

## 2015-04-22 DIAGNOSIS — A419 Sepsis, unspecified organism: Principal | ICD-10-CM | POA: Diagnosis present

## 2015-04-22 DIAGNOSIS — Z888 Allergy status to other drugs, medicaments and biological substances status: Secondary | ICD-10-CM

## 2015-04-22 DIAGNOSIS — E860 Dehydration: Secondary | ICD-10-CM | POA: Diagnosis present

## 2015-04-22 DIAGNOSIS — Z96652 Presence of left artificial knee joint: Secondary | ICD-10-CM | POA: Diagnosis present

## 2015-04-22 DIAGNOSIS — Z9049 Acquired absence of other specified parts of digestive tract: Secondary | ICD-10-CM | POA: Diagnosis present

## 2015-04-22 DIAGNOSIS — N183 Chronic kidney disease, stage 3 (moderate): Secondary | ICD-10-CM | POA: Diagnosis present

## 2015-04-22 DIAGNOSIS — D72829 Elevated white blood cell count, unspecified: Secondary | ICD-10-CM | POA: Diagnosis present

## 2015-04-22 DIAGNOSIS — Z800 Family history of malignant neoplasm of digestive organs: Secondary | ICD-10-CM | POA: Diagnosis not present

## 2015-04-22 DIAGNOSIS — N3289 Other specified disorders of bladder: Secondary | ICD-10-CM | POA: Diagnosis present

## 2015-04-22 DIAGNOSIS — R319 Hematuria, unspecified: Secondary | ICD-10-CM | POA: Diagnosis present

## 2015-04-22 DIAGNOSIS — E872 Acidosis, unspecified: Secondary | ICD-10-CM

## 2015-04-22 DIAGNOSIS — G4730 Sleep apnea, unspecified: Secondary | ICD-10-CM | POA: Diagnosis present

## 2015-04-22 DIAGNOSIS — R310 Gross hematuria: Secondary | ICD-10-CM

## 2015-04-22 DIAGNOSIS — S3722XA Contusion of bladder, initial encounter: Secondary | ICD-10-CM

## 2015-04-22 HISTORY — DX: Disorder of kidney and ureter, unspecified: N28.9

## 2015-04-22 LAB — URINALYSIS COMPLETE WITH MICROSCOPIC (ARMC ONLY)
Bacteria, UA: NONE SEEN
SPECIFIC GRAVITY, URINE: 1.026 (ref 1.005–1.030)
Squamous Epithelial / LPF: NONE SEEN
TRANS EPITHEL UA: 1

## 2015-04-22 LAB — CBC
HCT: 35.5 % — ABNORMAL LOW (ref 40.0–52.0)
Hemoglobin: 11.3 g/dL — ABNORMAL LOW (ref 13.0–18.0)
MCH: 25.3 pg — AB (ref 26.0–34.0)
MCHC: 31.8 g/dL — ABNORMAL LOW (ref 32.0–36.0)
MCV: 79.6 fL — ABNORMAL LOW (ref 80.0–100.0)
PLATELETS: 408 10*3/uL (ref 150–440)
RBC: 4.46 MIL/uL (ref 4.40–5.90)
RDW: 15.9 % — ABNORMAL HIGH (ref 11.5–14.5)
WBC: 17.6 10*3/uL — ABNORMAL HIGH (ref 3.8–10.6)

## 2015-04-22 LAB — BLOOD GAS, VENOUS
ACID-BASE DEFICIT: 3.3 mmol/L — AB (ref 0.0–2.0)
Bicarbonate: 21.3 mEq/L (ref 21.0–28.0)
FIO2: 0.21
PCO2 VEN: 36 mmHg — AB (ref 44.0–60.0)
Patient temperature: 37
pH, Ven: 7.38 (ref 7.320–7.430)

## 2015-04-22 LAB — COMPREHENSIVE METABOLIC PANEL
ALBUMIN: 4 g/dL (ref 3.5–5.0)
ALT: 13 U/L — AB (ref 17–63)
AST: 42 U/L — AB (ref 15–41)
Alkaline Phosphatase: 90 U/L (ref 38–126)
Anion gap: 14 (ref 5–15)
BILIRUBIN TOTAL: 0.7 mg/dL (ref 0.3–1.2)
BUN: 26 mg/dL — AB (ref 6–20)
CO2: 17 mmol/L — AB (ref 22–32)
CREATININE: 1.65 mg/dL — AB (ref 0.61–1.24)
Calcium: 9 mg/dL (ref 8.9–10.3)
Chloride: 104 mmol/L (ref 101–111)
GFR calc Af Amer: 45 mL/min — ABNORMAL LOW (ref >60)
GFR calc non Af Amer: 39 mL/min — ABNORMAL LOW (ref >60)
Glucose, Bld: 153 mg/dL — ABNORMAL HIGH (ref 65–99)
POTASSIUM: 4.5 mmol/L (ref 3.5–5.1)
SODIUM: 135 mmol/L (ref 135–145)
Total Protein: 7.5 g/dL (ref 6.5–8.1)

## 2015-04-22 LAB — HEMOGLOBIN AND HEMATOCRIT, BLOOD
HEMATOCRIT: 31.9 % — AB (ref 40.0–52.0)
HEMOGLOBIN: 10.2 g/dL — AB (ref 13.0–18.0)

## 2015-04-22 LAB — ABO/RH: ABO/RH(D): O POS

## 2015-04-22 LAB — TYPE AND SCREEN
ABO/RH(D): O POS
ANTIBODY SCREEN: NEGATIVE

## 2015-04-22 LAB — LACTIC ACID, PLASMA: LACTIC ACID, VENOUS: 2.8 mmol/L — AB (ref 0.5–2.0)

## 2015-04-22 MED ORDER — SODIUM CHLORIDE 0.9 % IV BOLUS (SEPSIS)
1000.0000 mL | Freq: Once | INTRAVENOUS | Status: AC
  Administered 2015-04-22: 1000 mL via INTRAVENOUS

## 2015-04-22 MED ORDER — ACETAMINOPHEN 325 MG PO TABS: 650.0000 mg | ORAL_TABLET | Freq: Four times a day (QID) | ORAL | Status: DC | PRN

## 2015-04-22 MED ORDER — PIPERACILLIN-TAZOBACTAM 3.375 G IVPB
3.3750 g | Freq: Three times a day (TID) | INTRAVENOUS | Status: DC
  Administered 2015-04-23 – 2015-04-24 (×4): 3.375 g via INTRAVENOUS
  Filled 2015-04-22 (×8): qty 50

## 2015-04-22 MED ORDER — ACETAMINOPHEN 650 MG RE SUPP: 650.0000 mg | Freq: Four times a day (QID) | RECTAL | Status: DC | PRN

## 2015-04-22 MED ORDER — ONDANSETRON HCL 4 MG PO TABS: 4.0000 mg | ORAL_TABLET | Freq: Four times a day (QID) | ORAL | Status: DC | PRN

## 2015-04-22 MED ORDER — ALBUTEROL SULFATE (2.5 MG/3ML) 0.083% IN NEBU: 2.5000 mg | INHALATION_SOLUTION | RESPIRATORY_TRACT | Status: DC | PRN

## 2015-04-22 MED ORDER — ONDANSETRON HCL 4 MG/2ML IJ SOLN: 4.0000 mg | Freq: Four times a day (QID) | INTRAMUSCULAR | Status: DC | PRN

## 2015-04-22 MED ORDER — SODIUM CHLORIDE 0.9 % IV SOLN
INTRAVENOUS | Status: DC
Start: 2015-04-22 — End: 2015-04-24
  Administered 2015-04-22 – 2015-04-24 (×3): via INTRAVENOUS

## 2015-04-22 MED ORDER — PIPERACILLIN-TAZOBACTAM 3.375 G IVPB
3.3750 g | Freq: Once | INTRAVENOUS | Status: AC
  Administered 2015-04-22: 3.375 g via INTRAVENOUS
  Filled 2015-04-22: qty 50

## 2015-04-22 MED ORDER — IOHEXOL 300 MG/ML  SOLN
100.0000 mL | Freq: Once | INTRAMUSCULAR | Status: AC | PRN
  Administered 2015-04-22: 100 mL via INTRAVENOUS

## 2015-04-22 NOTE — ED Notes (Signed)
Urologist changed out the catheter to a 3 way and flushed bladder until blood clots clear; then urologist hooked up NS to  Infuse and drain to gravity into urine bag

## 2015-04-22 NOTE — ED Notes (Addendum)
Pt has blood seeping from penis. States bladder feels full. Existing LDA's in computer removed since they no longer applied.

## 2015-04-22 NOTE — ED Notes (Signed)
Attempted to draw blood cultures and/ or place a second iv to no avail.

## 2015-04-22 NOTE — Consult Note (Signed)
CHIEF COMPLAINT: Bloody urine, bladder pain  HISTORY OF PRESENT ILLNESS: Corey Johnson is a 74 year old Caucasian male with a long history of BPH with bladder outlet obstruction, who developed acute urinary retention following cholecystectomy on Feb 07, 2015.He underwent photovaporization of the prostate with greenlight laser July 19. He has had persistent urinary retention and was performing self catheterization twice daily at the time of his last office visit proximally week ago. Currently he went on vacation to [place] and became constipated. This resulted in straining and subsequent hematuria. CT scan performed today in the emergency room revealed a large clot within the bladder. Postvoid residual per catheterization was 120 cc.  PAST MEDICAL HISTORY: No drug allergies.  CURRENT MEDICATIONS: Included diltiazem ER, vitamin D3, hydrochlorothiazide, enalapril, Uloric, terazosin, pravastatin, omeprazole, ropinirole, Avodart and tamsulosin.  PREVIOUS SURGICAL PROCEDURE: TUMT in 2005, left total knee replacement in 2015 and cholecystectomy on Feb 07, 2015.  SOCIAL HISTORY: The patient had tobacco or alcohol use. He quit smoking in 1965 with a 15-pack year history.  FAMILY HISTORY: The patient had a brother, who died of colon cancer at age 39.  PAST AND CURRENT MEDICAL CONDITIONS: 1. Hypertension. 2. Hypercholesterolemia. 3. Chronic renal disease. 4. Gastroesophageal reflux disease. 5. Gout. 6. Restless legs syndrome.  REVIEW OF SYSTEMS: The patient denies chest pain, shortness of breath, diabetes, or stroke.  PHYSICAL EXAMINATION: GENERAL: Well-nourished white male in no distress. HEENT: Sclerae were clear. Pupils were equally round and reactive to light and accommodation. Extraocular movements were intact. NECK: Supple. No palpable masses or tenderness. Thyroid gland was smooth without palpable nodules. No audible carotid bruits. LUNGS: Clear to  auscultation. CARDIOVASCULAR: Regular rhythm and rate without audible murmurs. ABDOMEN: Soft, nontender abdomen. GU: Uncircumcised, testes atrophic, 10 cm in size. RECTAL: 50 g smooth and nontender prostate. NEUROMUSCULAR: Alert and oriented x3.  LABORATORY DATA: CT scan report and films dated April 22, 2015 was reviewed.    IMPRESSION: 1. Clot urinary retention. 2. Benign prostatic hypertrophy with bladder outlet obstruction s/p greenlight laser PVP 03/28/15 3. Elevated PSA. 4. Renal insufficiency.  PLAN: I removed the 2 French Foley catheter and replaced it with a 24 French three-way clot irrigation catheter. I irrigated approximately 1 L of clots out of the bladder. At this point the efflux from the catheter was clear. I left the three-way catheter attached to continuous irrigation with sterile saline. If the urine remains clear by late this evening I would discontinue the irrigation and remove the catheter tomorrow. I would place the patient on the second dosage of stool softener to avoid constipation. It appeared that constipation triggered the clot retention due to straining. Patient was instructed to call my office Monday to schedule a follow-up appointment.

## 2015-04-22 NOTE — H&P (Signed)
[facility] NAME: Corey Johnson    MR#:  [id number]  DATE OF BIRTH:  12-Mar-1941  DATE OF ADMISSION:  04/22/2015  PRIMARY CARE PHYSICIAN: Casilda Carls, MD   REQUESTING/REFERRING PHYSICIAN: Delman Kitten, MD  CHIEF COMPLAINT:   Chief Complaint  Patient presents with  . Hematuria   hematuria today  HISTORY OF PRESENT ILLNESS:  Corey Johnson  is a 74 y.o. male with a known history of hypertension, CKD and BPH. Patient got the prostate surgery for BPH 1 months ago. He was fine until this morning he noticed a lot of bloody urine. He also mentioned that he has had cloudy urine for the past 3 days. in addition, he has fever and chills for the past 2-3 days. He denies any other symptoms. He was found to have leukocytosis and tachycardia in the ED and was treated with the Zosyn 1 dose.  PAST MEDICAL HISTORY:   Past Medical History  Diagnosis Date  . Arthritis   . Hypertension   . Renal disorder   . Sleep apnea     CPAP  . Gout   . Psoriasis   . Vitamin D deficiency   . BPH (benign prostatic hyperplasia)   . Hyperlipemia   . CRF (chronic renal failure)   . H. pylori infection   . Renal insufficiency     PAST SURGICAL HISTORY:   Past Surgical History  Procedure Laterality Date  . Cholecystectomy N/A 02/06/2015    Procedure: LAPAROSCOPIC CHOLECYSTECTOMY WITH INTRAOPERATIVE CHOLANGIOGRAM;  Surgeon: Dia Crawford III, MD;  Location: [facility] ORS;  Service: General;  Laterality: N/A;  . Shoulder surgery Right   . Knee arthroscopy    . Transurethral resection of prostate    . Replacement total knee    . Joint replacement      left knee  . Green light laser turp (transurethral resection of prostate N/A 03/28/2015    Procedure: GREEN LIGHT LASER TURP (TRANSURETHRAL RESECTION OF PROSTATE;  Surgeon: Royston Cowper, MD;  Location: [facility] ORS;  Service: Urology;  Laterality: N/A;    SOCIAL HISTORY:   Social History  Substance Use Topics   . Smoking status: Former Smoker -- 20 years    Types: Cigarettes  . Smokeless tobacco: Never Used  . Alcohol Use: No    FAMILY HISTORY:   Family History  Problem Relation Age of Onset  . Hypertension Mother   . Pneumonia Father     DRUG ALLERGIES:   Allergies  Allergen Reactions  . Nsaids Other (See Comments)    Kidney disease    REVIEW OF SYSTEMS:  CONSTITUTIONAL: Has fever and chills, generalized weakness.  EYES: No blurred or double vision.  EARS, NOSE, AND THROAT: No tinnitus or ear pain.  RESPIRATORY: No cough, shortness of breath, wheezing or hemoptysis.  CARDIOVASCULAR: No chest pain, orthopnea, edema.  GASTROINTESTINAL: No nausea, vomiting, diarrhea but has lower abdominal pain.  GENITOURINARY: No dysuria, has hematuria.  ENDOCRINE: No polyuria, nocturia. HEMATOLOGY: No anemia, easy bruising or bleeding SKIN: No rash or lesion. MUSCULOSKELETAL: No joint pain or arthritis.   NEUROLOGIC: No tingling, numbness, weakness.  PSYCHIATRY: No anxiety or depression.   MEDICATIONS AT HOME:   Prior to Admission medications   Medication Sig Start Date End Date Taking? Authorizing Provider  Cholecalciferol 1000 UNITS tablet Take 2,000 Units by mouth daily.     Historical Provider, MD  diltiazem (CARDIZEM CD) 180 MG 24 hr capsule Take 180 mg  by mouth daily.    Historical Provider, MD  docusate sodium (COLACE) 100 MG capsule Take 1 capsule (100 mg total) by mouth 2 (two) times daily. 02/17/15   Dustin Flock, MD  dutasteride (AVODART) 0.5 MG capsule Take 0.5 mg by mouth daily.    Historical Provider, MD  enalapril (VASOTEC) 20 MG tablet Take 20 mg by mouth daily.    Historical Provider, MD  febuxostat (ULORIC) 40 MG tablet Take 40 mg by mouth daily.    Historical Provider, MD  hydrochlorothiazide (HYDRODIURIL) 25 MG tablet Take 25 mg by mouth daily.    Historical Provider, MD  levofloxacin (LEVAQUIN) 500 MG tablet Take 1 tablet (500 mg total) by mouth daily. 03/28/15    Royston Cowper, MD  levothyroxine (SYNTHROID, LEVOTHROID) 50 MCG tablet Take 50 mcg by mouth daily before breakfast.    Historical Provider, MD  Meth-Hyo-M Bl-Na Phos-Ph Sal (URIBEL) 118 MG CAPS Take 1 capsule (118 mg total) by mouth every 6 (six) hours as needed. 03/28/15   Royston Cowper, MD  NUCYNTA 50 MG TABS tablet Take 1 tablet (50 mg total) by mouth every 6 (six) hours as needed. 1 TO 2 TABS Q 6 HOURS PRN PAIN 03/28/15   Royston Cowper, MD  omeprazole (PRILOSEC) 20 MG capsule Take 20 mg by mouth daily.    Historical Provider, MD  ondansetron (ZOFRAN ODT) 8 MG disintegrating tablet Take 1 tablet (8 mg total) by mouth every 6 (six) hours as needed for nausea or vomiting. 03/28/15   Royston Cowper, MD  pravastatin (PRAVACHOL) 10 MG tablet Take 10 mg by mouth at bedtime.     Historical Provider, MD  rOPINIRole (REQUIP) 1 MG tablet Take 1-2 mg by mouth at bedtime.    Historical Provider, MD  tamsulosin (FLOMAX) 0.4 MG CAPS capsule Take 0.4 mg by mouth daily.    Historical Provider, MD  terazosin (HYTRIN) 5 MG capsule Take 5 mg by mouth daily.     Historical Provider, MD      VITAL SIGNS:  Blood pressure 137/75, pulse 95, temperature 98.3 F (36.8 C), temperature source Oral, resp. rate 16, height 5\' 10"  (1.778 m), weight 108.863 kg (240 lb), SpO2 99 %.  PHYSICAL EXAMINATION:  GENERAL:  74 y.o.-year-old patient lying in the bed with no acute distress.  EYES: Pupils equal, round, reactive to light and accommodation. No scleral icterus. Extraocular muscles intact.  HEENT: Head atraumatic, normocephalic. Oropharynx and nasopharynx clear.  NECK:  Supple, no jugular venous distention. No thyroid enlargement, no tenderness.  LUNGS: Normal breath sounds bilaterally, no wheezing, rales,rhonchi or crepitation. No use of accessory muscles of respiration.  CARDIOVASCULAR: S1, S2 normal. No murmurs, rubs, or gallops.  ABDOMEN: Soft, nontender, nondistended. Bowel sounds present. No organomegaly or  mass. Bloody urine in Foley catheter bag. EXTREMITIES: No pedal edema, cyanosis, or clubbing.  NEUROLOGIC: Cranial nerves II through XII are intact. Muscle strength 5/5 in all extremities. Sensation intact. Gait not checked.  PSYCHIATRIC: The patient is alert and oriented x 3.  SKIN: No obvious rash, lesion, or ulcer.   LABORATORY PANEL:   CBC  Recent Labs Lab 04/22/15 1215 04/22/15 1740  WBC 17.6*  --   HGB 11.3* 10.2*  HCT 35.5* 31.9*  PLT 408  --    ------------------------------------------------------------------------------------------------------------------  Chemistries   Recent Labs Lab 04/22/15 1215  NA 135  K 4.5  CL 104  CO2 17*  GLUCOSE 153*  BUN 26*  CREATININE 1.65*  CALCIUM 9.0  AST 42*  ALT 13*  ALKPHOS 90  BILITOT 0.7   ------------------------------------------------------------------------------------------------------------------  Cardiac Enzymes No results for input(s): TROPONINI in the last 168 hours. ------------------------------------------------------------------------------------------------------------------  RADIOLOGY:  Ct Abdomen Pelvis W Wo Contrast  04/22/2015   CLINICAL DATA:  Dysuria and gross hematuria  EXAM: CT ABDOMEN AND PELVIS WITHOUT AND WITH CONTRAST  TECHNIQUE: Multidetector CT imaging of the abdomen and pelvis was performed following the standard protocol before and following the bolus administration of intravenous contrast.  CONTRAST:  110mL OMNIPAQUE IOHEXOL 300 MG/ML  SOLN  COMPARISON:  02/13/2015  FINDINGS: Lung bases are free of acute infiltrate or sizable effusion. No focal parenchymal nodule is seen.  The precontrast images show no evidence of renal calculi or obstructive changes. The appendix is within normal limits.  Postcontrast images show the liver, spleen, adrenal glands and pancreas to be within normal limits.  Kidneys are well visualized and again demonstrate multiple renal cysts bilaterally. No significant  enhancement is noted within the cysts. No calcifications or mural components are seen. On the prone delayed images some decreased attenuation is noted within the left renal collecting system posteriorly. There is a smooth meniscus identified within the renal pelvis on the left in this likely represents opacified and non-opacified urine. The possibility however of a small mural lesion cannot be totally excluded on the basis of this exam.  The bladder is well distended and demonstrates a large hyperdense area. This was not well appreciated on the prior CT examination and is most consistent with a large hematoma. A small amount of air is noted within the bladder from Foley catheter placement.  No free pelvic fluid is seen. No lymphadenopathy is noted. Diffuse aortoiliac calcifications are seen. The appendix is again well visualized and within normal limits. The bony structures show no acute abnormality. Degenerative changes of the lumbar spine are seen.  IMPRESSION: Changes consistent with a large hematoma within the urinary bladder.  Multiple renal cysts which appears simple in nature.  Differential attenuation on the delayed images within the renal pelvis on the left as described. This likely represents opacified and non-opacified urine although the possibility of a small mural lesion cannot be totally excluded. Retrograde pyelography may be helpful in this regard.   Electronically Signed   By: Inez Catalina M.D.   On: 04/22/2015 16:59   Dg Chest 2 View  04/22/2015   CLINICAL DATA:  Leukocytosis.  EXAM: CHEST  2 VIEW  COMPARISON:  August 04, 2012.  FINDINGS: The heart size and mediastinal contours are within normal limits. Both lungs are clear. No pneumothorax or pleural effusion is noted. The visualized skeletal structures are unremarkable.  IMPRESSION: No active cardiopulmonary disease.   Electronically Signed   By: Marijo Conception, M.D.   On: 04/22/2015 15:56    EKG:   Orders placed or performed during  the hospital encounter of 04/22/15  . ED EKG  . ED EKG    IMPRESSION AND PLAN:   Hematuria Sepsis UTI Dehydration Lactic acidosis CKD stage III Anemia of chronic disease  The patient will be admitted to medical floor. I will continue Zosyn, start IV fluid support and follow-up urine culture, CBC and blood culture. Dr. Eliberto Ivory, urologist suggested [facility].   All the records are reviewed and case discussed with ED provider. Management plans discussed with the patient, family and they are in agreement.  CODE STATUS: Full code  TOTAL TIME TAKING CARE OF THIS PATIENT: 57 minutes.    Demetrios Loll M.D on  04/22/2015 at 6:18 PM  Between 7am to 6pm - Pager - [phone]  After 6pm go to [email or web address] - password [email or web address] [facility]  Office  [phone]  CC: Primary care physician; Casilda Carls, MD

## 2015-04-22 NOTE — ED Notes (Signed)
Reports blood in urine this am , states his "penis is burning"

## 2015-04-22 NOTE — Progress Notes (Signed)
ANTIBIOTIC CONSULT NOTE - INITIAL  Pharmacy Consult for Zosyn dosing Indication: sepsis  Allergies  Allergen Reactions  . Lisinopril     Other reaction(s): Other (See Comments) hyperkalemia  . Metoprolol Other (See Comments)    Other reaction(s): Other (See Comments) severe cramps  . Tapentadol     Other reaction(s): Hallucination  . Tramadol Other (See Comments)  . Nsaids Other (See Comments)    Kidney disease  . Simvastatin Other (See Comments)    fatigue    Patient Measurements: Height: 5\' 10"  (177.8 cm) Weight: 240 lb (108.863 kg) IBW/kg (Calculated) : 73 Adjusted Body Weight: n/a  Vital Signs: Temp: 98.3 F (36.8 C) (08/13 2036) Temp Source: Oral (08/13 2036) BP: 156/76 mmHg (08/13 2036) Pulse Rate: 79 (08/13 2036) Intake/Output from previous day:   Intake/Output from this shift:    Labs:  Recent Labs  04/22/15 1215 04/22/15 1740  WBC 17.6*  --   HGB 11.3* 10.2*  PLT 408  --   CREATININE 1.65*  --    Estimated Creatinine Clearance: 47.1 mL/min (by C-G formula based on Cr of 1.65). No results for input(s): VANCOTROUGH, VANCOPEAK, VANCORANDOM, GENTTROUGH, GENTPEAK, GENTRANDOM, TOBRATROUGH, TOBRAPEAK, TOBRARND, AMIKACINPEAK, AMIKACINTROU, AMIKACIN in the last 72 hours.   Microbiology: No results found for this or any previous visit (from the past 720 hour(s)).  Medical History: Past Medical History  Diagnosis Date  . Arthritis   . Hypertension   . Renal disorder   . Sleep apnea     CPAP  . Gout   . Psoriasis   . Vitamin D deficiency   . BPH (benign prostatic hyperplasia)   . Hyperlipemia   . CRF (chronic renal failure)   . H. pylori infection   . Renal insufficiency     Medications:   Assessment: Blood cx pending UA: WBC TNTC, see report  Goal of Therapy:  Resolve infection  Plan:  Zosyn 3.375 grams q 8 hours ordered.  Nickalous Stingley S 04/22/2015,10:27 PM

## 2015-04-22 NOTE — ED Notes (Signed)
Report called but bed needs to be changed to a floor that can do continuous bladder irrigation.

## 2015-04-22 NOTE — ED Notes (Signed)
Lactic acid and vbg obtained after multiple attempts.

## 2015-04-22 NOTE — ED Provider Notes (Signed)
[facility] Emergency Department Provider Note  ____________________________________________  Time seen: Approximately 1:07 PM  I have reviewed the triage vital signs and the nursing notes.   HISTORY  Chief Complaint Hematuria    HPI Corey Johnson is a 74 y.o. male recent phototherapy presentation of the prostate. reports that yesterday he began to noticethat he was having some slight abnormal drainage from the urethra, this morning it became bloody and he feels like he is passing lots of blood in his urine which is making it difficult for him to urinate. He reports a urge to urinate, but states all he is getting is a trickle of blood and urine. No fevers or chills. No chest pain. No abdominal pain except for an urge to urinate.  Recent surgery. She is a patient of Dr. Rogers Blocker. No chest pain or trouble breathing. No fevers or chills. He reports that he did have to self catheterize after his surgery, and had a Foley catheter for the first couple days, but he stopped this roughly 2 weeks ago.   Described as an aching sense of fullness over the bladder. No diarrhea. No nausea or vomiting. No chest pain. Denies taking any blood thinners.  Past Medical History  Diagnosis Date  . Arthritis   . Hypertension   . Renal disorder   . Sleep apnea     CPAP  . Gout   . Psoriasis   . Vitamin D deficiency   . BPH (benign prostatic hyperplasia)   . Hyperlipemia   . CRF (chronic renal failure)   . H. pylori infection   . Renal insufficiency     Patient Active Problem List   Diagnosis Date Noted  . Gout 02/13/2015  . ARF (acute renal failure) 02/13/2015  . Abdominal distension 02/13/2015  . HTN (hypertension) 02/13/2015  . Psoriasis 02/13/2015  . BPH (benign prostatic hyperplasia) 02/13/2015  . RLS (restless legs syndrome) 02/13/2015  . Allergic rhinitis 02/13/2015  . GERD (gastroesophageal reflux disease) 02/13/2015  . Sleep apnea 02/13/2015  . CRI (chronic  renal insufficiency) 02/13/2015  . Acute renal failure 02/13/2015    Past Surgical History  Procedure Laterality Date  . Cholecystectomy N/A 02/06/2015    Procedure: LAPAROSCOPIC CHOLECYSTECTOMY WITH INTRAOPERATIVE CHOLANGIOGRAM;  Surgeon: Dia Crawford III, MD;  Location: [facility] ORS;  Service: General;  Laterality: N/A;  . Shoulder surgery Right   . Knee arthroscopy    . Transurethral resection of prostate    . Replacement total knee    . Joint replacement      left knee  . Green light laser turp (transurethral resection of prostate N/A 03/28/2015    Procedure: GREEN LIGHT LASER TURP (TRANSURETHRAL RESECTION OF PROSTATE;  Surgeon: Royston Cowper, MD;  Location: [facility] ORS;  Service: Urology;  Laterality: N/A;    Current Outpatient Rx  Name  Route  Sig  Dispense  Refill  . Cholecalciferol 1000 UNITS tablet   Oral   Take 2,000 Units by mouth daily.          Marland Kitchen diltiazem (CARDIZEM CD) 180 MG 24 hr capsule   Oral   Take 180 mg by mouth daily.         Marland Kitchen docusate sodium (COLACE) 100 MG capsule   Oral   Take 1 capsule (100 mg total) by mouth 2 (two) times daily.   10 capsule   0   . dutasteride (AVODART) 0.5 MG capsule   Oral   Take 0.5 mg by mouth daily.         Marland Kitchen  enalapril (VASOTEC) 20 MG tablet   Oral   Take 20 mg by mouth daily.         . febuxostat (ULORIC) 40 MG tablet   Oral   Take 40 mg by mouth daily.         . hydrochlorothiazide (HYDRODIURIL) 25 MG tablet   Oral   Take 25 mg by mouth daily.         Marland Kitchen levofloxacin (LEVAQUIN) 500 MG tablet   Oral   Take 1 tablet (500 mg total) by mouth daily.   7 tablet   0   . levothyroxine (SYNTHROID, LEVOTHROID) 50 MCG tablet   Oral   Take 50 mcg by mouth daily before breakfast.         . Meth-Hyo-M Bl-Na Phos-Ph Sal (URIBEL) 118 MG CAPS   Oral   Take 1 capsule (118 mg total) by mouth every 6 (six) hours as needed.   40 capsule   3     Dispense as written.   . NUCYNTA 50 MG TABS tablet   Oral   Take 1  tablet (50 mg total) by mouth every 6 (six) hours as needed. 1 TO 2 TABS Q 6 HOURS PRN PAIN   30 tablet   0     Dispense as written.   Marland Kitchen omeprazole (PRILOSEC) 20 MG capsule   Oral   Take 20 mg by mouth daily.         . ondansetron (ZOFRAN ODT) 8 MG disintegrating tablet   Oral   Take 1 tablet (8 mg total) by mouth every 6 (six) hours as needed for nausea or vomiting.   10 tablet   3   . pravastatin (PRAVACHOL) 10 MG tablet   Oral   Take 10 mg by mouth at bedtime.          Marland Kitchen rOPINIRole (REQUIP) 1 MG tablet   Oral   Take 1-2 mg by mouth at bedtime.         . tamsulosin (FLOMAX) 0.4 MG CAPS capsule   Oral   Take 0.4 mg by mouth daily.         Marland Kitchen terazosin (HYTRIN) 5 MG capsule   Oral   Take 5 mg by mouth daily.            Allergies Nsaids  Family History  Problem Relation Age of Onset  . Hypertension Mother   . Pneumonia Father     Social History Social History  Substance Use Topics  . Smoking status: Former Smoker -- 20 years    Types: Cigarettes  . Smokeless tobacco: Never Used  . Alcohol Use: No    Review of Systems Constitutional: No fever/chills Eyes: No visual changes. ENT: No sore throat. Cardiovascular: Denies chest pain. Respiratory: Denies shortness of breath. Gastrointestinal: See history of present illness  No nausea, no vomiting.  No diarrhea.  No constipation. Genitourinary: See history of present illness Musculoskeletal: Negative for back pain. Skin: Negative for rash. Neurological: Negative for headaches, focal weakness or numbness.  10-point ROS otherwise negative.  ____________________________________________   PHYSICAL EXAM:  VITAL SIGNS: ED Triage Vitals  Enc Vitals Group     BP 04/22/15 1152 133/88 mmHg     Pulse Rate 04/22/15 1152 129     Resp 04/22/15 1152 24     Temp 04/22/15 1152 98.3 F (36.8 C)     Temp Source 04/22/15 1152 Oral     SpO2 04/22/15 1152 99 %  Weight 04/22/15 1152 240 lb (108.863 kg)      Height 04/22/15 1152 5\' 10"  (1.778 m)     Head Cir --      Peak Flow --      Pain Score 04/22/15 1153 10     Pain Loc --      Pain Edu? --      Excl. in [place]? --     Constitutional: Alert and oriented. Well appearing and in no acute distress. Eyes: Conjunctivae are normal. PERRL. EOMI. Head: Atraumatic. Nose: No congestion/rhinnorhea. Mouth/Throat: Mucous membranes are moist.  Oropharynx non-erythematous. Neck: No stridor.   Cardiovascular: Normal rate, regular rhythm. Grossly normal heart sounds.  Good peripheral circulation. Respiratory: Normal respiratory effort.  No retractions. Lungs CTAB. Gastrointestinal: Soft and nontender except for some mild discomfort in the suprapubic region with slight fullness. The abdomen is protuberant but soft. No peritoneal signs. No rebound tenderness. No abdominal bruits. No CVA tenderness. Testicles nontender bilateral. No scrotal edema or erythema. The glans penis is normal. The patient has a trickle of what appears to be blood tinged urine which she is collected approximately 100 cc from in a urine container. Musculoskeletal: No lower extremity tenderness nor edema.  No joint effusions. Neurologic:  Normal speech and language. No gross focal neurologic deficits are appreciated. No gait instability. Skin:  Skin is warm, dry and intact. No rash noted. Psychiatric: Mood and affect are normal. Speech and behavior are normal.  ____________________________________________   LABS (all labs ordered are listed, but only abnormal results are displayed)  Labs Reviewed  CBC - Abnormal; Notable for the following:    WBC 17.6 (*)    Hemoglobin 11.3 (*)    HCT 35.5 (*)    MCV 79.6 (*)    MCH 25.3 (*)    MCHC 31.8 (*)    RDW 15.9 (*)    All other components within normal limits  COMPREHENSIVE METABOLIC PANEL - Abnormal; Notable for the following:    CO2 17 (*)    Glucose, Bld 153 (*)    BUN 26 (*)    Creatinine, Ser 1.65 (*)    AST 42 (*)    ALT 13  (*)    GFR calc non Af Amer 39 (*)    GFR calc Af Amer 45 (*)    All other components within normal limits  URINALYSIS COMPLETEWITH MICROSCOPIC ([facility] ONLY) - Abnormal; Notable for the following:    Color, Urine RED (*)    APPearance HAZY (*)    Glucose, UA   (*)    Value: TEST NOT REPORTED DUE TO COLOR INTERFERENCE OF URINE PIGMENT   Bilirubin Urine   (*)    Value: TEST NOT REPORTED DUE TO COLOR INTERFERENCE OF URINE PIGMENT   Ketones, ur   (*)    Value: TEST NOT REPORTED DUE TO COLOR INTERFERENCE OF URINE PIGMENT   Hgb urine dipstick   (*)    Value: TEST NOT REPORTED DUE TO COLOR INTERFERENCE OF URINE PIGMENT   Protein, ur   (*)    Value: TEST NOT REPORTED DUE TO COLOR INTERFERENCE OF URINE PIGMENT   Nitrite   (*)    Value: TEST NOT REPORTED DUE TO COLOR INTERFERENCE OF URINE PIGMENT   Leukocytes, UA   (*)    Value: TEST NOT REPORTED DUE TO COLOR INTERFERENCE OF URINE PIGMENT   All other components within normal limits  BLOOD GAS, VENOUS - Abnormal; Notable for the following:    pCO2,  Ven 36 (*)    Acid-base deficit 3.3 (*)    All other components within normal limits  LACTIC ACID, PLASMA - Abnormal; Notable for the following:    Lactic Acid, Venous 2.8 (*)    All other components within normal limits  CULTURE, BLOOD (ROUTINE X 2)  CULTURE, BLOOD (ROUTINE X 2)  LACTIC ACID, PLASMA  HEMOGLOBIN AND HEMATOCRIT, BLOOD  TYPE AND SCREEN   ____________________________________________  EKG  ED ECG REPORT I, QUALE, MARK, the attending physician, personally viewed and interpreted this ECG.  Date: 04/22/2015 EKG Time: 1605  Rate:98 Rhythm: normal sinus rhythm QRS Axis: normal Intervals: normal ST/T Wave abnormalities: normal, some slight artifact is present but no acute ischemic changes or significant T-wave abnormality noted Conduction Disutrbances: none Narrative Interpretation: Normal sinus rhythm, some artifact present but no acute ischemic changes  noted.  ____________________________________________  RADIOLOGY  IMPRESSION: Changes consistent with a large hematoma within the urinary bladder.  Multiple renal cysts which appears simple in nature.  Differential attenuation on the delayed images within the renal pelvis on the left as described. This likely represents opacified and non-opacified urine although the possibility of a small mural lesion cannot be totally excluded. Retrograde pyelography may be helpful in this regard.  DG Chest 2 View (Final result) Result time: 04/22/15 15:56:08   Final result by Rad Results In Interface (04/22/15 15:56:08)   Narrative:   CLINICAL DATA: Leukocytosis.  EXAM: CHEST 2 VIEW  COMPARISON: August 04, 2012.  FINDINGS: The heart size and mediastinal contours are within normal limits. Both lungs are clear. No pneumothorax or pleural effusion is noted. The visualized skeletal structures are unremarkable.  IMPRESSION: No active cardiopulmonary disease.    ____________________________________________   PROCEDURES  Procedure(s) performed: None  Critical Care performed: No  ____________________________________________   INITIAL IMPRESSION / ASSESSMENT AND PLAN / ED COURSE  Pertinent labs & imaging results that were available during my care of the patient were reviewed by me and considered in my medical decision making (see chart for details).  Patient with recent prostate surgery. No experiencing some hematuria difficulty voiding. His previous surgery notes no bladder tumors. This is likely postoperative bleeding, likely secondary to scar tissue or sluffing. I discussed with Dr. Tresa Moore (Urology), appears patient likely experiencing tissue sluffing post surgical. Recommends insert foley, check creat, and would discharge to follow-up with urology if improved with foley.  Patient has no infectious symptoms. He has expected slight discomfort in the lower abdomen. We'll  insert Foley catheter, she reports improvement thereafter and symptoms improved plan to discharge him to home to follow-up with Dr. Rogers Blocker this week. Patient is familiar with use of Foley catheter.  D/W Dr. Rogers Blocker. Reviewed CT findings. Given low bicarb, elevated lactic and and leukocytosis with prostate surgery approximately one month ago, however admit the patient for antibiotic's as we continue to workup etiology of today's presentation and rule out bacteremia. Discussed with the patient's wife are aware and agreeable with the plan. He is presently hemodynamically stable, he is not in any distress but his laboratory analysis is concerning. We'll have nothing to put a finger on his far as exact etiology of his leukocytosis and low bicarbonate, but suspect the patient does have metabolic acidosis and doesn't elevated lactate and at least one risk factor for bacteremia. ____________________________________________   FINAL CLINICAL IMPRESSION(S) / ED DIAGNOSES  Final diagnoses:  Lactic acid acidosis  Metabolic acidemia  Leukocytosis  Hematoma of bladder wall, initial encounter  Hematuria  SIRS (systemic inflammatory  response syndrome)      Delman Kitten, MD 04/22/15 [phone]

## 2015-04-23 LAB — URINALYSIS COMPLETE WITH MICROSCOPIC (ARMC ONLY)
Bilirubin Urine: NEGATIVE
Glucose, UA: NEGATIVE mg/dL
Ketones, ur: NEGATIVE mg/dL
Nitrite: NEGATIVE
Protein, ur: 100 mg/dL — AB
Specific Gravity, Urine: 1.011 (ref 1.005–1.030)
Squamous Epithelial / HPF: NONE SEEN
pH: 6 (ref 5.0–8.0)

## 2015-04-23 LAB — CBC
HCT: 29.9 % — ABNORMAL LOW (ref 40.0–52.0)
HEMOGLOBIN: 9.7 g/dL — AB (ref 13.0–18.0)
MCH: 26 pg (ref 26.0–34.0)
MCHC: 32.5 g/dL (ref 32.0–36.0)
MCV: 79.9 fL — AB (ref 80.0–100.0)
PLATELETS: 298 10*3/uL (ref 150–440)
RBC: 3.74 MIL/uL — ABNORMAL LOW (ref 4.40–5.90)
RDW: 15.8 % — ABNORMAL HIGH (ref 11.5–14.5)
WBC: 12.1 10*3/uL — ABNORMAL HIGH (ref 3.8–10.6)

## 2015-04-23 LAB — BASIC METABOLIC PANEL
Anion gap: 8 (ref 5–15)
BUN: 23 mg/dL — ABNORMAL HIGH (ref 6–20)
CALCIUM: 8.4 mg/dL — AB (ref 8.9–10.3)
CO2: 24 mmol/L (ref 22–32)
CREATININE: 1.45 mg/dL — AB (ref 0.61–1.24)
Chloride: 106 mmol/L (ref 101–111)
GFR calc non Af Amer: 45 mL/min — ABNORMAL LOW (ref >60)
GFR, EST AFRICAN AMERICAN: 52 mL/min — AB (ref >60)
Glucose, Bld: 114 mg/dL — ABNORMAL HIGH (ref 65–99)
POTASSIUM: 4 mmol/L (ref 3.5–5.1)
SODIUM: 138 mmol/L (ref 135–145)

## 2015-04-23 LAB — HEMOGLOBIN A1C: HEMOGLOBIN A1C: 5.8 % (ref 4.0–6.0)

## 2015-04-23 MED ORDER — DUTASTERIDE 0.5 MG PO CAPS
0.5000 mg | ORAL_CAPSULE | Freq: Every day | ORAL | Status: DC
  Administered 2015-04-23 – 2015-04-24 (×2): 0.5 mg via ORAL
  Filled 2015-04-23 (×3): qty 1

## 2015-04-23 MED ORDER — LEVOTHYROXINE SODIUM 50 MCG PO TABS
50.0000 ug | ORAL_TABLET | Freq: Every day | ORAL | Status: DC
  Administered 2015-04-24: 50 ug via ORAL
  Filled 2015-04-23: qty 1

## 2015-04-23 MED ORDER — DOCUSATE SODIUM 100 MG PO CAPS
100.0000 mg | ORAL_CAPSULE | Freq: Two times a day (BID) | ORAL | Status: DC
  Administered 2015-04-23 – 2015-04-24 (×3): 100 mg via ORAL
  Filled 2015-04-23 (×3): qty 1

## 2015-04-23 MED ORDER — ROPINIROLE HCL 1 MG PO TABS
2.0000 mg | ORAL_TABLET | Freq: Every evening | ORAL | Status: DC
  Administered 2015-04-23: 2 mg via ORAL
  Filled 2015-04-23: qty 2

## 2015-04-23 MED ORDER — ALLOPURINOL 100 MG PO TABS
100.0000 mg | ORAL_TABLET | Freq: Every day | ORAL | Status: DC
  Administered 2015-04-23 – 2015-04-24 (×2): 100 mg via ORAL
  Filled 2015-04-23 (×2): qty 1

## 2015-04-23 MED ORDER — ENALAPRIL MALEATE 10 MG PO TABS
20.0000 mg | ORAL_TABLET | Freq: Every day | ORAL | Status: DC
  Administered 2015-04-23 – 2015-04-24 (×2): 20 mg via ORAL
  Filled 2015-04-23 (×2): qty 2
  Filled 2015-04-23: qty 8

## 2015-04-23 MED ORDER — PRAVASTATIN SODIUM 10 MG PO TABS
10.0000 mg | ORAL_TABLET | Freq: Every day | ORAL | Status: DC
  Administered 2015-04-23: 10 mg via ORAL
  Filled 2015-04-23: qty 1

## 2015-04-23 MED ORDER — COLCHICINE 0.6 MG PO TABS
0.6000 mg | ORAL_TABLET | Freq: Every day | ORAL | Status: DC | PRN
  Filled 2015-04-23: qty 1

## 2015-04-23 MED ORDER — HYDROCHLOROTHIAZIDE 25 MG PO TABS
25.0000 mg | ORAL_TABLET | Freq: Every day | ORAL | Status: DC
  Administered 2015-04-23 – 2015-04-24 (×2): 25 mg via ORAL
  Filled 2015-04-23 (×2): qty 1

## 2015-04-23 MED ORDER — VITAMIN D 1000 UNITS PO TABS
2000.0000 [IU] | ORAL_TABLET | Freq: Every day | ORAL | Status: DC
  Administered 2015-04-23 – 2015-04-24 (×2): 2000 [IU] via ORAL
  Filled 2015-04-23 (×2): qty 2

## 2015-04-23 MED ORDER — FERROUS SULFATE 325 (65 FE) MG PO TBEC
325.0000 mg | DELAYED_RELEASE_TABLET | ORAL | Status: DC
  Administered 2015-04-23 – 2015-04-24 (×2): 325 mg via ORAL
  Filled 2015-04-23 (×3): qty 1

## 2015-04-23 MED ORDER — PANTOPRAZOLE SODIUM 40 MG PO TBEC
40.0000 mg | DELAYED_RELEASE_TABLET | Freq: Every day | ORAL | Status: DC
  Administered 2015-04-23 – 2015-04-24 (×2): 40 mg via ORAL
  Filled 2015-04-23 (×2): qty 1

## 2015-04-23 MED ORDER — TAMSULOSIN HCL 0.4 MG PO CAPS
0.4000 mg | ORAL_CAPSULE | Freq: Every day | ORAL | Status: DC
  Administered 2015-04-23 – 2015-04-24 (×2): 0.4 mg via ORAL
  Filled 2015-04-23 (×2): qty 1

## 2015-04-23 MED ORDER — OXYCODONE HCL 5 MG PO TABS
10.0000 mg | ORAL_TABLET | Freq: Four times a day (QID) | ORAL | Status: DC | PRN
  Administered 2015-04-24: 10 mg via ORAL
  Filled 2015-04-23: qty 2

## 2015-04-23 MED ORDER — CHOLECALCIFEROL 25 MCG (1000 UT) PO TABS: 2000.0000 [IU] | ORAL_TABLET | Freq: Every day | ORAL | Status: DC

## 2015-04-23 NOTE — Progress Notes (Signed)
[place] [facility]                                                                                                                                                                                            Patient Demographics   Corey Johnson, is a 74 y.o. male, DOB - 1940-10-18, XVQ:[id number]  Admit date - 04/22/2015   Admitting Physician Demetrios Loll, MD  Outpatient Primary MD for the patient is Casilda Carls, MD   LOS - 1  Subjective: Patient's three-way irrigation was stopped earlier. He has no further hematuria in his Foley bag. He is feeling better     Review of Systems:   CONSTITUTIONAL: No documented fever. No fatigue, weakness. No weight gain, no weight loss.  EYES: No blurry or double vision.  ENT: No tinnitus. No postnasal drip. No redness of the oropharynx.  RESPIRATORY: No cough, no wheeze, no hemoptysis. No dyspnea.  CARDIOVASCULAR: No chest pain. No orthopnea. No palpitations. No syncope.  GASTROINTESTINAL: No nausea, no vomiting or diarrhea. No abdominal pain. No melena or hematochezia.  GENITOURINARY: Positive dysuria  and hematuria.  ENDOCRINE: No polyuria or nocturia. No heat or cold intolerance.  HEMATOLOGY: No anemia. No bruising. No bleeding.  INTEGUMENTARY: No rashes. No lesions.  MUSCULOSKELETAL: No arthritis. No swelling. No gout.  NEUROLOGIC: No numbness, tingling, or ataxia. No seizure-type activity.  PSYCHIATRIC: No anxiety. No insomnia. No ADD.    Vitals:   Filed Vitals:   04/22/15 2036 04/23/15 0038 04/23/15 0755 04/23/15 1221  BP: 156/76 135/55 156/76 162/79  Pulse: 79 72 68 73  Temp: 98.3 F (36.8 C) 97.9 F (36.6 C) 97.6 F (36.4 C) 97.7 F (36.5 C)  TempSrc: Oral Oral Oral Oral  Resp: 20 20 20 18   Height: 5\' 9"  (1.753 m)     Weight: 109.952 kg (242 lb 6.4 oz)     SpO2: 97% 97% 95% 99%    Wt Readings from Last 3 Encounters:  04/22/15 109.952 kg (242 lb 6.4 oz)  03/28/15 108.863 kg (240 lb)   03/15/15 108.863 kg (240 lb)     Intake/Output Summary (Last 24 hours) at 04/23/15 1226 Last data filed at 04/23/15 1024  Gross per 24 hour  Intake  11837 ml  Output  13275 ml  Net  -1438 ml    Physical Exam:   GENERAL: Pleasant-appearing in no apparent distress.  HEAD, EYES, EARS, NOSE AND THROAT: Atraumatic, normocephalic. Extraocular muscles are intact. Pupils equal and reactive to light. Sclerae anicteric. No conjunctival injection. No oro-pharyngeal erythema.  NECK: Supple. There is no jugular venous distention. No bruits,  no lymphadenopathy, no thyromegaly.  HEART: Regular rate and rhythm,. No murmurs, no rubs, no clicks.  LUNGS: Clear to auscultation bilaterally. No rales or rhonchi. No wheezes.  ABDOMEN: Soft, flat, nontender, nondistended. Has good bowel sounds. No hepatosplenomegaly appreciated.  EXTREMITIES: No evidence of any cyanosis, clubbing, or peripheral edema.  +2 pedal and radial pulses bilaterally.  NEUROLOGIC: The patient is alert, awake, and oriented x3 with no focal motor or sensory deficits appreciated bilaterally.  SKIN: Moist and warm with no rashes appreciated.  Psych: Not anxious, depressed LN: No inguinal LN enlargement    Antibiotics   Anti-infectives    Start     Dose/Rate Route Frequency Ordered Stop   04/23/15 0000  piperacillin-tazobactam (ZOSYN) IVPB 3.375 g     3.375 g 12.5 mL/hr over 240 Minutes Intravenous 3 times per day 04/22/15 2157     04/22/15 1545  piperacillin-tazobactam (ZOSYN) IVPB 3.375 g     3.375 g 12.5 mL/hr over 240 Minutes Intravenous  Once 04/22/15 1544 04/22/15 2306      Medications   Scheduled Meds: . allopurinol  100 mg Oral Daily  . cholecalciferol  2,000 Units Oral Daily  . docusate sodium  100 mg Oral BID  . dutasteride  0.5 mg Oral Daily  . enalapril  20 mg Oral Daily  . ferrous sulfate  325 mg Oral [id number]  . hydrochlorothiazide  25 mg Oral Daily  . [START ON 04/24/2015] levothyroxine  50 mcg Oral QAC  breakfast  . pantoprazole  40 mg Oral Daily  . piperacillin-tazobactam (ZOSYN)  IV  3.375 g Intravenous 3 times per day  . pravastatin  10 mg Oral QHS  . rOPINIRole  2 mg Oral QPM  . tamsulosin  0.4 mg Oral Daily   Continuous Infusions: . sodium chloride 75 mL/hr at 04/23/15 1224   PRN Meds:.acetaminophen **OR** acetaminophen, albuterol, colchicine, ondansetron **OR** ondansetron (ZOFRAN) IV, oxyCODONE   Data Review:   Micro Results Recent Results (from the past 240 hour(s))  Culture, blood (routine x 2)     Status: None (Preliminary result)   Collection Time: 04/22/15  5:39 PM  Result Value Ref Range Status   Specimen Description BLOOD L HAND  Final   Special Requests BOTTLES DRAWN AEROBIC AND ANAEROBIC 3ML  Final   Culture NO GROWTH < 24 HOURS  Final   Report Status PENDING  Incomplete  Culture, blood (routine x 2)     Status: None (Preliminary result)   Collection Time: 04/22/15  5:39 PM  Result Value Ref Range Status   Specimen Description BLOOD L HAND  Final   Special Requests BOTTLES DRAWN AEROBIC AND ANAEROBIC 3ML  Final   Culture NO GROWTH < 24 HOURS  Final   Report Status PENDING  Incomplete    Radiology Reports Ct Abdomen Pelvis W Wo Contrast  04/22/2015   CLINICAL DATA:  Dysuria and gross hematuria  EXAM: CT ABDOMEN AND PELVIS WITHOUT AND WITH CONTRAST  TECHNIQUE: Multidetector CT imaging of the abdomen and pelvis was performed following the standard protocol before and following the bolus administration of intravenous contrast.  CONTRAST:  189mL OMNIPAQUE IOHEXOL 300 MG/ML  SOLN  COMPARISON:  02/13/2015  FINDINGS: Lung bases are free of acute infiltrate or sizable effusion. No focal parenchymal nodule is seen.  The precontrast images show no evidence of renal calculi or obstructive changes. The appendix is within normal limits.  Postcontrast images show the liver, spleen, adrenal glands and pancreas to be within normal limits.  Kidneys  are well visualized and again  demonstrate multiple renal cysts bilaterally. No significant enhancement is noted within the cysts. No calcifications or mural components are seen. On the prone delayed images some decreased attenuation is noted within the left renal collecting system posteriorly. There is a smooth meniscus identified within the renal pelvis on the left in this likely represents opacified and non-opacified urine. The possibility however of a small mural lesion cannot be totally excluded on the basis of this exam.  The bladder is well distended and demonstrates a large hyperdense area. This was not well appreciated on the prior CT examination and is most consistent with a large hematoma. A small amount of air is noted within the bladder from Foley catheter placement.  No free pelvic fluid is seen. No lymphadenopathy is noted. Diffuse aortoiliac calcifications are seen. The appendix is again well visualized and within normal limits. The bony structures show no acute abnormality. Degenerative changes of the lumbar spine are seen.  IMPRESSION: Changes consistent with a large hematoma within the urinary bladder.  Multiple renal cysts which appears simple in nature.  Differential attenuation on the delayed images within the renal pelvis on the left as described. This likely represents opacified and non-opacified urine although the possibility of a small mural lesion cannot be totally excluded. Retrograde pyelography may be helpful in this regard.   Electronically Signed   By: Inez Catalina M.D.   On: 04/22/2015 16:59   Dg Chest 2 View  04/22/2015   CLINICAL DATA:  Leukocytosis.  EXAM: CHEST  2 VIEW  COMPARISON:  August 04, 2012.  FINDINGS: The heart size and mediastinal contours are within normal limits. Both lungs are clear. No pneumothorax or pleural effusion is noted. The visualized skeletal structures are unremarkable.  IMPRESSION: No active cardiopulmonary disease.   Electronically Signed   By: Marijo Conception, M.D.   On:  04/22/2015 15:56     CBC  Recent Labs Lab 04/22/15 1215 04/22/15 1740 04/23/15 0449  WBC 17.6*  --  12.1*  HGB 11.3* 10.2* 9.7*  HCT 35.5* 31.9* 29.9*  PLT 408  --  298  MCV 79.6*  --  79.9*  MCH 25.3*  --  26.0  MCHC 31.8*  --  32.5  RDW 15.9*  --  15.8*    Chemistries   Recent Labs Lab 04/22/15 1215 04/23/15 0449  NA 135 138  K 4.5 4.0  CL 104 106  CO2 17* 24  GLUCOSE 153* 114*  BUN 26* 23*  CREATININE 1.65* 1.45*  CALCIUM 9.0 8.4*  AST 42*  --   ALT 13*  --   ALKPHOS 90  --   BILITOT 0.7  --    ------------------------------------------------------------------------------------------------------------------ estimated creatinine clearance is 53 mL/min (by C-G formula based on Cr of 1.45). ------------------------------------------------------------------------------------------------------------------  Recent Labs  04/22/15 1739  HGBA1C 5.8   ------------------------------------------------------------------------------------------------------------------ No results for input(s): CHOL, HDL, LDLCALC, TRIG, CHOLHDL, LDLDIRECT in the last 72 hours. ------------------------------------------------------------------------------------------------------------------ No results for input(s): TSH, T4TOTAL, T3FREE, THYROIDAB in the last 72 hours.  Invalid input(s): FREET3 ------------------------------------------------------------------------------------------------------------------ No results for input(s): VITAMINB12, FOLATE, FERRITIN, TIBC, IRON, RETICCTPCT in the last 72 hours.  Coagulation profile No results for input(s): INR, PROTIME in the last 168 hours.  No results for input(s): DDIMER in the last 72 hours.  Cardiac Enzymes No results for input(s): CKMB, TROPONINI, MYOGLOBIN in the last 168 hours.  Invalid input(s): CK ------------------------------------------------------------------------------------------------------------------ Invalid input(s):  [place]   1. Hematuria  status post three-way Foley irrigation with resolution, I have discussed the case with Dr. Eliberto Ivory recommends leaving the Foley in and he'll follow him up as outpatient    2. Sepsis suspected due to UTI however urinalysis was inadequate repeat UA    3.UTI changes anabiotic's to Ceftin   4. Dehydration IV fluids   5. 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 Anemia of chronic diseaseContinue to monitor      Code Status Orders        Start     Ordered   04/22/15 2024  Full code   Continuous     04/22/15 2023           Consults urology DVT Prophylaxis  SCDs  Lab Results  Component Value Date   PLT 298 04/23/2015     Time Spent in minutes   35 Luisa Dago M.D on 04/23/2015 at 12:26 PM  Between 7am to 6pm - Pager - [phone]  After 6pm go to [email or web address] - password [email or web address] [facility]   Office  [phone]

## 2015-04-23 NOTE — Plan of Care (Signed)
Problem: Phase I Progression Outcomes Goal: Voiding-avoid urinary catheter unless indicated Outcome: Not Applicable Date Met:  88/71/95 foley  Comments:  foley

## 2015-04-23 NOTE — Progress Notes (Signed)
Notified By Rogers Blocker to discontinue CBI if urine was clear

## 2015-04-24 ENCOUNTER — Telehealth: Payer: Self-pay | Admitting: Gastroenterology

## 2015-04-24 LAB — BASIC METABOLIC PANEL
Anion gap: 6 (ref 5–15)
BUN: 16 mg/dL (ref 6–20)
CHLORIDE: 107 mmol/L (ref 101–111)
CO2: 27 mmol/L (ref 22–32)
CREATININE: 1.4 mg/dL — AB (ref 0.61–1.24)
Calcium: 8.4 mg/dL — ABNORMAL LOW (ref 8.9–10.3)
GFR calc Af Amer: 55 mL/min — ABNORMAL LOW (ref >60)
GFR calc non Af Amer: 47 mL/min — ABNORMAL LOW (ref >60)
GLUCOSE: 123 mg/dL — AB (ref 65–99)
Potassium: 4 mmol/L (ref 3.5–5.1)
Sodium: 140 mmol/L (ref 135–145)

## 2015-04-24 LAB — CBC
HCT: 28.5 % — ABNORMAL LOW (ref 40.0–52.0)
Hemoglobin: 9.3 g/dL — ABNORMAL LOW (ref 13.0–18.0)
MCH: 26.1 pg (ref 26.0–34.0)
MCHC: 32.8 g/dL (ref 32.0–36.0)
MCV: 79.6 fL — AB (ref 80.0–100.0)
PLATELETS: 295 10*3/uL (ref 150–440)
RBC: 3.58 MIL/uL — AB (ref 4.40–5.90)
RDW: 15.8 % — AB (ref 11.5–14.5)
WBC: 10.5 10*3/uL (ref 3.8–10.6)

## 2015-04-24 MED ORDER — ALUM & MAG HYDROXIDE-SIMETH 200-200-20 MG/5ML PO SUSP: 5.0000 mL | Freq: Three times a day (TID) | ORAL | Status: DC | PRN

## 2015-04-24 MED ORDER — LEVOFLOXACIN 500 MG PO TABS
500.0000 mg | ORAL_TABLET | Freq: Every day | ORAL | Status: DC
  Administered 2015-04-24: 500 mg via ORAL
  Filled 2015-04-24 (×2): qty 1

## 2015-04-24 MED ORDER — ALUM & MAG HYDROXIDE-SIMETH 200-200-25 MG PO CHEW: 1.0000 | CHEWABLE_TABLET | Freq: Three times a day (TID) | ORAL | Status: DC | PRN

## 2015-04-24 MED ORDER — PHENAZOPYRIDINE HCL 100 MG PO TABS
200.0000 mg | ORAL_TABLET | Freq: Three times a day (TID) | ORAL | Status: DC
  Administered 2015-04-24: 200 mg via ORAL
  Filled 2015-04-24: qty 2

## 2015-04-24 MED ORDER — PHENAZOPYRIDINE HCL 200 MG PO TABS: 200.0000 mg | ORAL_TABLET | Freq: Three times a day (TID) | ORAL | Status: DC

## 2015-04-24 MED ORDER — LEVOTHYROXINE SODIUM 50 MCG PO TABS: 50.0000 ug | ORAL_TABLET | Freq: Every day | ORAL | Status: DC

## 2015-04-24 MED ORDER — ALUM & MAG HYDROXIDE-SIMETH 200-200-25 MG PO CHEW
1.0000 | CHEWABLE_TABLET | Freq: Three times a day (TID) | ORAL | Status: DC | PRN
  Filled 2015-04-24: qty 1

## 2015-04-24 NOTE — Telephone Encounter (Signed)
Colonoscopy triage °

## 2015-04-24 NOTE — Care Management Note (Addendum)
Case Management Note  Patient Details  Name: ELIAN GLOSTER MRN: [id number] Date of Birth: 1940-09-26  Subjective/Objective:                 From home with spouse. Independent with Statistician. Home accessible with rails. Spouse stated that he can afford medications. PCP is Dr Rosario Jacks. Choice offered of [facility] provider. Had been open to [place] and requested to continue.   Action/Plan:       Home with Home Health RN , No DME needed. Referral placed [place].    Expected Discharge Date:       This Date           Expected Discharge Plan:  [place]  In-House Referral:  NA  Discharge planning Services  CM Consult  Post Acute Care Choice:  NA Choice offered to:  Patient, Adult Children, Spouse  DME Arranged:    DME Agency:     HH Arranged:  RN [place] Agency:  [facility]  Status of Service:  Completed, signed off  Medicare Important Message Given:    Date Medicare IM Given:    Medicare IM give by:    Date Additional Medicare IM Given:    Additional Medicare Important Message give by:     If discussed at [place] of Stay Meetings, dates discussed:    Additional Comments:  Alvie Heidelberg, RN 04/24/2015, 10:07 AM

## 2015-04-24 NOTE — Care Management (Signed)
Clarification from Dr Posey Pronto. Patient will not be discharged on IVABX. Informed Floydene Flock at [facility]

## 2015-04-24 NOTE — Discharge Instructions (Signed)
°  DIET:  Cardiac diet  DISCHARGE CONDITION:  Stable  ACTIVITY:  Activity as tolerated  OXYGEN:  Home Oxygen: No.   Oxygen Delivery: room air  DISCHARGE LOCATION:  home    ADDITIONAL DISCHARGE INSTRUCTION:keep foley in place    If you experience worsening of your admission symptoms, develop shortness of breath, life threatening emergency, suicidal or homicidal thoughts you must seek medical attention immediately by calling 911 or calling your MD immediately  if symptoms less severe.  You Must read complete instructions/literature along with all the possible adverse reactions/side effects for all the Medicines you take and that have been prescribed to you. Take any new Medicines after you have completely understood and accpet all the possible adverse reactions/side effects.   Please note  You were cared for by a hospitalist during your hospital stay. If you have any questions about your discharge medications or the care you received while you were in the hospital after you are discharged, you can call the unit and asked to speak with the hospitalist on call if the hospitalist that took care of you is not available. Once you are discharged, your primary care physician will handle any further medical issues. Please note that NO REFILLS for any discharge medications will be authorized once you are discharged, as it is imperative that you return to your primary care physician (or establish a relationship with a primary care physician if you do not have one) for your aftercare needs so that they can reassess your need for medications and monitor your lab values.  Notify MD for any problems with the foley catheter, or lower abdominal pain or fever of 101 or higher.

## 2015-04-24 NOTE — Progress Notes (Signed)
Pt A and O x 4. VSS. Pt tolerating diet well. Minimal complaints of pain with meds given to control. No other complaints. IV removed intact, prescriptions given. Pt discharged with foley to be removed in follow-up visit to Dr. Gabriel Carina. Pt voiced understanding of discharge instructions with no further questions. Pt discharged via wheelchair with axillary.

## 2015-04-25 NOTE — Telephone Encounter (Signed)
Phoned patient left message to contact office for colon triage

## 2015-04-27 ENCOUNTER — Encounter
Admission: AD | Disposition: A | Discharge status: Home / Self Care | Payer: Self-pay | Source: Home / Self Care | Attending: Urology

## 2015-04-27 ENCOUNTER — Inpatient Hospital Stay: Payer: Medicare HMO | Admitting: Certified Registered"

## 2015-04-27 ENCOUNTER — Observation Stay
Admission: AD | Admit: 2015-04-27 | Discharge: 2015-04-28 | Disposition: A | Discharge status: Home / Self Care | Payer: Medicare HMO | Attending: Urology | Admitting: Urology

## 2015-04-27 ENCOUNTER — Inpatient Hospital Stay: Admission: AD | Admit: 2015-04-27 | Payer: Medicare HMO | Source: Ambulatory Visit | Admitting: Urology

## 2015-04-27 ENCOUNTER — Encounter: Payer: Self-pay | Admitting: Emergency Medicine

## 2015-04-27 DIAGNOSIS — M109 Gout, unspecified: Secondary | ICD-10-CM | POA: Diagnosis not present

## 2015-04-27 DIAGNOSIS — N179 Acute kidney failure, unspecified: Secondary | ICD-10-CM | POA: Insufficient documentation

## 2015-04-27 DIAGNOSIS — D62 Acute posthemorrhagic anemia: Secondary | ICD-10-CM | POA: Diagnosis present

## 2015-04-27 DIAGNOSIS — E559 Vitamin D deficiency, unspecified: Secondary | ICD-10-CM | POA: Insufficient documentation

## 2015-04-27 DIAGNOSIS — Z825 Family history of asthma and other chronic lower respiratory diseases: Secondary | ICD-10-CM | POA: Diagnosis not present

## 2015-04-27 DIAGNOSIS — Z87891 Personal history of nicotine dependence: Secondary | ICD-10-CM | POA: Insufficient documentation

## 2015-04-27 DIAGNOSIS — I129 Hypertensive chronic kidney disease with stage 1 through stage 4 chronic kidney disease, or unspecified chronic kidney disease: Secondary | ICD-10-CM | POA: Diagnosis not present

## 2015-04-27 DIAGNOSIS — Z9049 Acquired absence of other specified parts of digestive tract: Secondary | ICD-10-CM | POA: Diagnosis not present

## 2015-04-27 DIAGNOSIS — R55 Syncope and collapse: Secondary | ICD-10-CM | POA: Insufficient documentation

## 2015-04-27 DIAGNOSIS — G4730 Sleep apnea, unspecified: Secondary | ICD-10-CM | POA: Insufficient documentation

## 2015-04-27 DIAGNOSIS — N390 Urinary tract infection, site not specified: Secondary | ICD-10-CM | POA: Insufficient documentation

## 2015-04-27 DIAGNOSIS — E785 Hyperlipidemia, unspecified: Secondary | ICD-10-CM | POA: Insufficient documentation

## 2015-04-27 DIAGNOSIS — L409 Psoriasis, unspecified: Secondary | ICD-10-CM | POA: Insufficient documentation

## 2015-04-27 DIAGNOSIS — R140 Abdominal distension (gaseous): Secondary | ICD-10-CM | POA: Diagnosis not present

## 2015-04-27 DIAGNOSIS — R310 Gross hematuria: Secondary | ICD-10-CM | POA: Diagnosis present

## 2015-04-27 DIAGNOSIS — K219 Gastro-esophageal reflux disease without esophagitis: Secondary | ICD-10-CM | POA: Diagnosis not present

## 2015-04-27 DIAGNOSIS — J309 Allergic rhinitis, unspecified: Secondary | ICD-10-CM | POA: Diagnosis not present

## 2015-04-27 DIAGNOSIS — N189 Chronic kidney disease, unspecified: Secondary | ICD-10-CM | POA: Diagnosis not present

## 2015-04-27 DIAGNOSIS — N400 Enlarged prostate without lower urinary tract symptoms: Secondary | ICD-10-CM | POA: Diagnosis not present

## 2015-04-27 DIAGNOSIS — G2581 Restless legs syndrome: Secondary | ICD-10-CM | POA: Diagnosis not present

## 2015-04-27 DIAGNOSIS — R339 Retention of urine, unspecified: Principal | ICD-10-CM | POA: Insufficient documentation

## 2015-04-27 DIAGNOSIS — Z888 Allergy status to other drugs, medicaments and biological substances status: Secondary | ICD-10-CM | POA: Diagnosis not present

## 2015-04-27 DIAGNOSIS — Z8249 Family history of ischemic heart disease and other diseases of the circulatory system: Secondary | ICD-10-CM | POA: Insufficient documentation

## 2015-04-27 DIAGNOSIS — Z96652 Presence of left artificial knee joint: Secondary | ICD-10-CM | POA: Insufficient documentation

## 2015-04-27 DIAGNOSIS — Z79899 Other long term (current) drug therapy: Secondary | ICD-10-CM | POA: Insufficient documentation

## 2015-04-27 DIAGNOSIS — R319 Hematuria, unspecified: Secondary | ICD-10-CM

## 2015-04-27 HISTORY — PX: CYSTOSCOPY: SHX5120

## 2015-04-27 LAB — CBC
HEMATOCRIT: 23.4 % — AB (ref 40.0–52.0)
Hemoglobin: 7.6 g/dL — ABNORMAL LOW (ref 13.0–18.0)
MCH: 25.6 pg — ABNORMAL LOW (ref 26.0–34.0)
MCHC: 32.5 g/dL (ref 32.0–36.0)
MCV: 78.9 fL — ABNORMAL LOW (ref 80.0–100.0)
Platelets: 377 10*3/uL (ref 150–440)
RBC: 2.97 MIL/uL — ABNORMAL LOW (ref 4.40–5.90)
RDW: 15.9 % — AB (ref 11.5–14.5)
WBC: 12.6 10*3/uL — ABNORMAL HIGH (ref 3.8–10.6)

## 2015-04-27 LAB — COMPREHENSIVE METABOLIC PANEL
ALBUMIN: 3.2 g/dL — AB (ref 3.5–5.0)
ALT: 11 U/L — ABNORMAL LOW (ref 17–63)
ANION GAP: 11 (ref 5–15)
AST: 27 U/L (ref 15–41)
Alkaline Phosphatase: 61 U/L (ref 38–126)
BILIRUBIN TOTAL: 0.2 mg/dL — AB (ref 0.3–1.2)
BUN: 21 mg/dL — ABNORMAL HIGH (ref 6–20)
CO2: 26 mmol/L (ref 22–32)
Calcium: 9.3 mg/dL (ref 8.9–10.3)
Chloride: 99 mmol/L — ABNORMAL LOW (ref 101–111)
Creatinine, Ser: 2.15 mg/dL — ABNORMAL HIGH (ref 0.61–1.24)
GFR calc Af Amer: 33 mL/min — ABNORMAL LOW (ref >60)
GFR, EST NON AFRICAN AMERICAN: 28 mL/min — AB (ref >60)
GLUCOSE: 131 mg/dL — AB (ref 65–99)
POTASSIUM: 3.7 mmol/L (ref 3.5–5.1)
Sodium: 136 mmol/L (ref 135–145)
TOTAL PROTEIN: 6.7 g/dL (ref 6.5–8.1)

## 2015-04-27 LAB — TROPONIN I: TROPONIN I: 1.05 ng/mL — AB (ref <0.031)

## 2015-04-27 LAB — CULTURE, BLOOD (ROUTINE X 2)
Culture: NO GROWTH
Culture: NO GROWTH

## 2015-04-27 LAB — GLUCOSE, CAPILLARY: Glucose-Capillary: 134 mg/dL — ABNORMAL HIGH (ref 65–99)

## 2015-04-27 LAB — PREPARE RBC (CROSSMATCH)

## 2015-04-27 SURGERY — CYSTOSCOPY
Anesthesia: General | Wound class: Clean Contaminated

## 2015-04-27 MED ORDER — SUCCINYLCHOLINE CHLORIDE 20 MG/ML IJ SOLN
INTRAMUSCULAR | Status: DC | PRN
  Administered 2015-04-27: 100 mg via INTRAVENOUS

## 2015-04-27 MED ORDER — SODIUM CHLORIDE 0.9 % IV BOLUS (SEPSIS): 500.0000 mL | Freq: Once | INTRAVENOUS | Status: AC

## 2015-04-27 MED ORDER — CEFAZOLIN SODIUM 1-5 GM-% IV SOLN
INTRAVENOUS | Status: DC | PRN
  Administered 2015-04-27: 1 g via INTRAVENOUS

## 2015-04-27 MED ORDER — SODIUM CHLORIDE 0.9 % IV SOLN
10.0000 mL/h | Freq: Once | INTRAVENOUS | Status: AC
  Administered 2015-04-27: 10 mL/h via INTRAVENOUS

## 2015-04-27 MED ORDER — FENTANYL CITRATE (PF) 100 MCG/2ML IJ SOLN
25.0000 ug | INTRAMUSCULAR | Status: DC | PRN
  Administered 2015-04-27 (×4): 25 ug via INTRAVENOUS
  Filled 2015-04-27 (×4): qty 1

## 2015-04-27 MED ORDER — LIDOCAINE HCL (CARDIAC) 20 MG/ML IV SOLN
INTRAVENOUS | Status: DC | PRN
  Administered 2015-04-27: 100 mg via INTRAVENOUS

## 2015-04-27 MED ORDER — OXYCODONE HCL 5 MG/5ML PO SOLN: 5.0000 mg | Freq: Once | ORAL | Status: AC | PRN

## 2015-04-27 MED ORDER — BISACODYL 10 MG RE SUPP: 10.0000 mg | Freq: Every day | RECTAL | Status: DC | PRN

## 2015-04-27 MED ORDER — OXYCODONE-ACETAMINOPHEN 7.5-325 MG PO TABS
1.0000 | ORAL_TABLET | ORAL | Status: DC | PRN
  Administered 2015-04-28: 1 via ORAL
  Filled 2015-04-27: qty 1

## 2015-04-27 MED ORDER — NEOSTIGMINE METHYLSULFATE 10 MG/10ML IV SOLN
INTRAVENOUS | Status: DC | PRN
  Administered 2015-04-27: 3.5 mg via INTRAVENOUS

## 2015-04-27 MED ORDER — ROCURONIUM BROMIDE 100 MG/10ML IV SOLN
INTRAVENOUS | Status: DC | PRN
  Administered 2015-04-27: 20 mg via INTRAVENOUS

## 2015-04-27 MED ORDER — ONDANSETRON HCL 4 MG/2ML IJ SOLN: 4.0000 mg | INTRAMUSCULAR | Status: DC | PRN

## 2015-04-27 MED ORDER — DOCUSATE SODIUM 100 MG PO CAPS
200.0000 mg | ORAL_CAPSULE | Freq: Two times a day (BID) | ORAL | Status: DC
  Administered 2015-04-27 – 2015-04-28 (×2): 200 mg via ORAL
  Filled 2015-04-27 (×2): qty 2

## 2015-04-27 MED ORDER — HYOSCYAMINE SULFATE 0.125 MG SL SUBL
0.1250 mg | SUBLINGUAL_TABLET | SUBLINGUAL | Status: DC | PRN
  Filled 2015-04-27: qty 1

## 2015-04-27 MED ORDER — PANTOPRAZOLE SODIUM 40 MG PO TBEC
40.0000 mg | DELAYED_RELEASE_TABLET | Freq: Every day | ORAL | Status: DC
  Administered 2015-04-27 – 2015-04-28 (×2): 40 mg via ORAL
  Filled 2015-04-27 (×2): qty 1

## 2015-04-27 MED ORDER — FENTANYL CITRATE (PF) 100 MCG/2ML IJ SOLN
INTRAMUSCULAR | Status: DC | PRN
  Administered 2015-04-27: 100 ug via INTRAVENOUS

## 2015-04-27 MED ORDER — LIDOCAINE HCL 2 % EX GEL
CUTANEOUS | Status: DC | PRN
  Administered 2015-04-27: 1 via URETHRAL

## 2015-04-27 MED ORDER — SODIUM CHLORIDE 0.9 % IV SOLN
INTRAVENOUS | Status: DC | PRN
  Administered 2015-04-27: 17:00:00 via INTRAVENOUS

## 2015-04-27 MED ORDER — PROPOFOL 10 MG/ML IV BOLUS
INTRAVENOUS | Status: DC | PRN
  Administered 2015-04-27 (×2): 150 mg via INTRAVENOUS

## 2015-04-27 MED ORDER — KETAMINE HCL 50 MG/ML IJ SOLN
INTRAMUSCULAR | Status: DC | PRN
  Administered 2015-04-27: 30 mg via INTRAMUSCULAR

## 2015-04-27 MED ORDER — DEXTROSE-NACL 5-0.45 % IV SOLN
INTRAVENOUS | Status: DC
  Administered 2015-04-27 – 2015-04-28 (×2): via INTRAVENOUS

## 2015-04-27 MED ORDER — PHENYLEPHRINE HCL 10 MG/ML IJ SOLN
INTRAMUSCULAR | Status: DC | PRN
  Administered 2015-04-27 (×2): 200 ug via INTRAVENOUS

## 2015-04-27 MED ORDER — DEXTROSE-NACL 5-0.45 % IV SOLN: INTRAVENOUS | Status: DC

## 2015-04-27 MED ORDER — DEXTROSE-NACL 5-0.2 % IV SOLN
INTRAVENOUS | Status: DC
  Administered 2015-04-27: 16:00:00 via INTRAVENOUS

## 2015-04-27 MED ORDER — SENNOSIDES-DOCUSATE SODIUM 8.6-50 MG PO TABS: 1.0000 | ORAL_TABLET | Freq: Every evening | ORAL | Status: DC | PRN

## 2015-04-27 MED ORDER — GLYCOPYRROLATE 0.2 MG/ML IJ SOLN
INTRAMUSCULAR | Status: DC | PRN
  Administered 2015-04-27: .6 mg via INTRAVENOUS

## 2015-04-27 MED ORDER — BELLADONNA ALKALOIDS-OPIUM 16.2-60 MG RE SUPP
RECTAL | Status: DC | PRN
  Administered 2015-04-27: 1 via RECTAL

## 2015-04-27 MED ORDER — OXYCODONE HCL 5 MG PO TABS
5.0000 mg | ORAL_TABLET | Freq: Once | ORAL | Status: AC | PRN
  Administered 2015-04-27: 5 mg via ORAL

## 2015-04-27 MED ORDER — ONDANSETRON HCL 4 MG/2ML IJ SOLN
INTRAMUSCULAR | Status: DC | PRN
  Administered 2015-04-27: 4 mg via INTRAVENOUS

## 2015-04-27 SURGICAL SUPPLY — 31 items
BAG DRAIN CYSTO-URO LG1000N (MISCELLANEOUS) ×2 IMPLANT
BAG URO DRAIN 2000ML W/SPOUT (MISCELLANEOUS) ×2 IMPLANT
CATH FOL 3WAY LX COUV 24X75 (CATHETERS) ×2 IMPLANT
CATH FOLEY 2WAY  5CC 20FR SIL (CATHETERS) ×1
CATH FOLEY 2WAY 5CC 20FR SIL (CATHETERS) ×1 IMPLANT
CATH URETL OPEN END 6X70 (CATHETERS) IMPLANT
CORD URO TURP 10FT (MISCELLANEOUS) ×2 IMPLANT
ELECT RESECT LOOP CUT 26F (MISCELLANEOUS) IMPLANT
ELECT RESECT POWERBALL 24F (MISCELLANEOUS) ×2 IMPLANT
GLOVE BIO SURGEON STRL SZ7 (GLOVE) ×4 IMPLANT
GLOVE BIO SURGEON STRL SZ7.5 (GLOVE) ×2 IMPLANT
GOWN STRL REUS W/ TWL LRG LVL3 (GOWN DISPOSABLE) ×1 IMPLANT
GOWN STRL REUS W/ TWL XL LVL3 (GOWN DISPOSABLE) ×1 IMPLANT
GOWN STRL REUS W/TWL LRG LVL3 (GOWN DISPOSABLE) ×1
GOWN STRL REUS W/TWL XL LVL3 (GOWN DISPOSABLE) ×1
JELLY LUB 2OZ STRL (MISCELLANEOUS) ×1
JELLY LUBE 2OZ STRL (MISCELLANEOUS) ×1 IMPLANT
KIT RM TURNOVER CYSTO AR (KITS) ×2 IMPLANT
NDL SAFETY 18GX1.5 (NEEDLE) ×2 IMPLANT
PACK CYSTO AR (MISCELLANEOUS) ×2 IMPLANT
PAD GROUND ADULT SPLIT (MISCELLANEOUS) ×2 IMPLANT
PLUG CATH AND CAP STER (CATHETERS) ×2 IMPLANT
PREP PVP WINGED SPONGE (MISCELLANEOUS) ×2 IMPLANT
SET IRRIG Y TYPE TUR BLADDER L (SET/KITS/TRAYS/PACK) ×2 IMPLANT
SOL .9 NS 3000ML IRR  AL (IV SOLUTION) ×6
SOL .9 NS 3000ML IRR UROMATIC (IV SOLUTION) ×6 IMPLANT
SOL PREP PVP 2OZ (MISCELLANEOUS) ×2
SOLUTION PREP PVP 2OZ (MISCELLANEOUS) ×1 IMPLANT
SYRINGE IRR TOOMEY STRL 70CC (SYRINGE) ×2 IMPLANT
WATER STERILE IRR 1000ML POUR (IV SOLUTION) ×2 IMPLANT
WATER STERILE IRR 3000ML UROMA (IV SOLUTION) ×4 IMPLANT

## 2015-04-27 NOTE — Anesthesia Preprocedure Evaluation (Addendum)
Anesthesia Evaluation  Patient identified by MRN, date of birth, ID band Patient awake    Reviewed: Allergy & Precautions, H&P , NPO status , Patient's Chart, lab work & pertinent test results, reviewed documented beta blocker date and time   Airway Mallampati: II  TM Distance: >3 FB Neck ROM: limited    Dental  (+) Poor Dentition, Chipped, Missing   Pulmonary sleep apnea and Continuous Positive Airway Pressure Ventilation , former smoker,  breath sounds clear to auscultation  Pulmonary exam normal       Cardiovascular Exercise Tolerance: Good hypertension, Normal cardiovascular examRhythm:regular Rate:Normal     Neuro/Psych negative neurological ROS  negative psych ROS   GI/Hepatic Neg liver ROS, GERD-  Controlled,  Endo/Other  negative endocrine ROS  Renal/GU CRFRenal disease  negative genitourinary   Musculoskeletal  (+) Arthritis -,   Abdominal   Peds  Hematology  (+) anemia ,   Anesthesia Other Findings Past Medical History:   Arthritis                                                    Hypertension                                                 Renal disorder                                               Sleep apnea                                                    Comment:CPAP   Gout                                                         Psoriasis                                                    Vitamin D deficiency                                         BPH (benign prostatic hyperplasia)                           Hyperlipemia  CRF (chronic renal failure)                                  H. pylori infection                                      Reproductive/Obstetrics negative OB ROS                            Anesthesia Physical  Anesthesia Plan  ASA: III  Anesthesia Plan: General LMA   Post-op Pain Management:     Induction:   Airway Management Planned:   Additional Equipment:   Intra-op Plan:   Post-operative Plan:   Informed Consent: I have reviewed the patients History and Physical, chart, labs and discussed the procedure including the risks, benefits and alternatives for the proposed anesthesia with the patient or authorized representative who has indicated his/her understanding and acceptance.   Dental Advisory Given  Plan Discussed with: Anesthesiologist, CRNA and Surgeon  Anesthesia Plan Comments: (Plan to transfuse in the OR.)       Anesthesia Quick Evaluation

## 2015-04-27 NOTE — ED Provider Notes (Signed)
[facility] Emergency Department Provider Note  ____________________________________________  Time seen: On arrival  I have reviewed the triage vital signs and the nursing notes.   HISTORY  Chief Complaint Loss of Consciousness    HPI Corey Johnson is a 74 y.o. male who presents after a near-syncopal episode. Patient was to be a direct admit under Dr. Eliberto Ivory for continued urinary bleeding but as he was getting up a car today he became lightheaded and slumped over in the wheelchair. He denies chest pain. He denies fevers chills. No nausea no vomiting. He recently had his bladder irrigated and the urology office     Past Medical History  Diagnosis Date  . Arthritis   . Hypertension   . Renal disorder   . Sleep apnea     CPAP  . Gout   . Psoriasis   . Vitamin D deficiency   . BPH (benign prostatic hyperplasia)   . Hyperlipemia   . CRF (chronic renal failure)   . H. pylori infection   . Renal insufficiency     Patient Active Problem List   Diagnosis Date Noted  . Sepsis 04/22/2015  . UTI (lower urinary tract infection) 04/22/2015  . Hematuria 04/22/2015  . Gout 02/13/2015  . ARF (acute renal failure) 02/13/2015  . Abdominal distension 02/13/2015  . HTN (hypertension) 02/13/2015  . Psoriasis 02/13/2015  . BPH (benign prostatic hyperplasia) 02/13/2015  . RLS (restless legs syndrome) 02/13/2015  . Allergic rhinitis 02/13/2015  . GERD (gastroesophageal reflux disease) 02/13/2015  . Sleep apnea 02/13/2015  . CRI (chronic renal insufficiency) 02/13/2015  . Acute renal failure 02/13/2015    Past Surgical History  Procedure Laterality Date  . Cholecystectomy N/A 02/06/2015    Procedure: LAPAROSCOPIC CHOLECYSTECTOMY WITH INTRAOPERATIVE CHOLANGIOGRAM;  Surgeon: Dia Crawford III, MD;  Location: [facility] ORS;  Service: General;  Laterality: N/A;  . Shoulder surgery Right   . Knee arthroscopy    . Transurethral resection of prostate    . Replacement total  knee    . Joint replacement      left knee  . Green light laser turp (transurethral resection of prostate N/A 03/28/2015    Procedure: GREEN LIGHT LASER TURP (TRANSURETHRAL RESECTION OF PROSTATE;  Surgeon: Royston Cowper, MD;  Location: [facility] ORS;  Service: Urology;  Laterality: N/A;    Current Outpatient Rx  Name  Route  Sig  Dispense  Refill  . acetaminophen (TYLENOL) 500 MG tablet   Oral   Take 1,000 mg by mouth every 6 (six) hours as needed for mild pain or moderate pain.         Marland Kitchen allopurinol (ZYLOPRIM) 100 MG tablet   Oral   Take 100 mg by mouth daily.         Marland Kitchen alum & mag hydroxide-simeth (GELUSIL) 240-973-53 MG suspension   Oral   Chew 1 tablet by mouth 3 (three) times daily as needed for indigestion, heartburn or flatulence.   100 tablet   0   . Cholecalciferol 1000 UNITS tablet   Oral   Take 2,000 Units by mouth daily.          . colchicine 0.6 MG tablet   Oral   Take 0.6 mg by mouth daily as needed. For gout flare ups.         . docusate sodium (COLACE) 100 MG capsule   Oral   Take 1 capsule (100 mg total) by mouth 2 (two) times daily.   10 capsule  0   . dutasteride (AVODART) 0.5 MG capsule   Oral   Take 0.5 mg by mouth daily.         . enalapril (VASOTEC) 20 MG tablet   Oral   Take 20 mg by mouth daily.         . hydrochlorothiazide (HYDRODIURIL) 25 MG tablet   Oral   Take 25 mg by mouth daily.         Marland Kitchen levofloxacin (LEVAQUIN) 500 MG tablet   Oral   Take 1 tablet by mouth daily.         Marland Kitchen levothyroxine (SYNTHROID, LEVOTHROID) 50 MCG tablet   Oral   Take 1 tablet (50 mcg total) by mouth daily before breakfast.   10 tablet   0   . Meth-Hyo-M Bl-Na Phos-Ph Sal (URIBEL) 118 MG CAPS   Oral   Take 1 capsule (118 mg total) by mouth every 6 (six) hours as needed.   40 capsule   3     Dispense as written.   . NUCYNTA 50 MG TABS tablet   Oral   Take 1 tablet (50 mg total) by mouth every 6 (six) hours as needed. 1 TO 2 TABS Q 6  HOURS PRN PAIN   30 tablet   0     Dispense as written.   Marland Kitchen omeprazole (PRILOSEC) 20 MG capsule   Oral   Take 20 mg by mouth daily as needed (for heartburn/indigestion).          . ondansetron (ZOFRAN ODT) 8 MG disintegrating tablet   Oral   Take 1 tablet (8 mg total) by mouth every 6 (six) hours as needed for nausea or vomiting.   10 tablet   3   . phenazopyridine (PYRIDIUM) 200 MG tablet   Oral   Take 1 tablet (200 mg total) by mouth 3 (three) times daily with meals.   10 tablet   0   . pravastatin (PRAVACHOL) 10 MG tablet   Oral   Take 10 mg by mouth at bedtime.          Marland Kitchen rOPINIRole (REQUIP) 1 MG tablet   Oral   Take 2 mg by mouth every evening.          . tamsulosin (FLOMAX) 0.4 MG CAPS capsule   Oral   Take 0.4 mg by mouth daily.           Allergies Lisinopril; Metoprolol; Tapentadol; Tramadol; Nsaids; and Simvastatin  Family History  Problem Relation Age of Onset  . Hypertension Mother   . Pneumonia Father     Social History Social History  Substance Use Topics  . Smoking status: Former Smoker -- 20 years    Types: Cigarettes  . Smokeless tobacco: Never Used  . Alcohol Use: No    Review of Systems  Constitutional: Negative for fever. Eyes: Negative for visual changes. ENT: Negative for sore throat Cardiovascular: Negative for chest pain. Respiratory: Negative for shortness of breath. Gastrointestinal: Negative for abdominal pain, vomiting and diarrhea. Genitourinary: Negative for dysuria. Positive for significant hematuria Musculoskeletal: Negative for back pain. Skin: Negative for rash. Neurological: Negative for headaches or focal weakness Psychiatric: No anxiety    ____________________________________________   PHYSICAL EXAM:  VITAL SIGNS: ED Triage Vitals  Enc Vitals Group     BP 04/27/15 1117 126/69 mmHg     Pulse Rate 04/27/15 1117 91     Resp 04/27/15 1117 20     Temp 04/27/15 1117 97.7 F (36.5  C)     Temp src  --      SpO2 04/27/15 1130 96 %     Weight 04/27/15 1117 240 lb (108.863 kg)     Height 04/27/15 1117 5\' 9"  (1.753 m)     Head Cir --      Peak Flow --      Pain Score 04/27/15 1119 0     Pain Loc --      Pain Edu? --      Excl. in [place]? --      Constitutional: Alert and oriented. Well appearing and in no distress. Eyes: Conjunctivae are normal.  ENT   Head: Normocephalic and atraumatic.   Mouth/Throat: Mucous membranes are moist. Cardiovascular: Normal rate, regular rhythm. Normal and symmetric distal pulses are present in all extremities. No murmurs, rubs, or gallops. Respiratory: Normal respiratory effort without tachypnea nor retractions. Breath sounds are clear and equal bilaterally.  Gastrointestinal: Soft and non-tender in all quadrants. No distention. There is no CVA tenderness. Genitourinary: Foley catheter in place, dark urine Musculoskeletal: Nontender with normal range of motion in all extremities. No lower extremity tenderness nor edema. Neurologic:  Normal speech and language. No gross focal neurologic deficits are appreciated. Skin:  Skin is warm, dry and intact. No rash noted. Psychiatric: Mood and affect are normal. Patient exhibits appropriate insight and judgment.  ____________________________________________    LABS (pertinent positives/negatives)  Labs Reviewed  CBC - Abnormal; Notable for the following:    WBC 12.6 (*)    RBC 2.97 (*)    Hemoglobin 7.6 (*)    HCT 23.4 (*)    MCV 78.9 (*)    MCH 25.6 (*)    RDW 15.9 (*)    All other components within normal limits  COMPREHENSIVE METABOLIC PANEL - Abnormal; Notable for the following:    Chloride 99 (*)    Glucose, Bld 131 (*)    BUN 21 (*)    Creatinine, Ser 2.15 (*)    Albumin 3.2 (*)    ALT 11 (*)    Total Bilirubin 0.2 (*)    GFR calc non Af Amer 28 (*)    GFR calc Af Amer 33 (*)    All other components within normal limits  TROPONIN I - Abnormal; Notable for the following:    Troponin  I 1.05 (*)    All other components within normal limits  TYPE AND SCREEN  PREPARE RBC (CROSSMATCH)    ____________________________________________   EKG  ED ECG REPORT I, Lavonia Drafts, the attending physician, personally viewed and interpreted this ECG.   Date: 04/27/2015  EKG Time: 11:20 AM  Rate: 91  Rhythm: normal sinus rhythm  Axis: Normal axis  Intervals:Prolonged QT  ST&T Change: Nonspecific   ____________________________________________    RADIOLOGY I have personally reviewed any xrays that were ordered on this patient: None  ____________________________________________   PROCEDURES  Procedure(s) performed: none  Critical Care performed: yes  CRITICAL CARE Performed by: Lavonia Drafts   Total critical care time: 35  Critical care time was exclusive of separately billable procedures and treating other patients.  Critical care was necessary to treat or prevent imminent or life-threatening deterioration.  Critical care was time spent personally by me on the following activities: development of treatment plan with patient and/or surrogateas well as nursing, discussions with consultants, evaluation of patient's response to treatment, examination of patient, obtaining history from patient or surrogate, ordering and performing treatments and interventions, ordering and review of laboratory studies,  ordering and review of radiographic studies, pulse oximetry and re-evaluation of patient's condition.  ____________________________________________   INITIAL IMPRESSION / ASSESSMENT AND PLAN / ED COURSE  Pertinent labs & imaging results that were available during my care of the patient were reviewed by me and considered in my medical decision making (see chart for details).   Patient with evidence of dehydration as well as significant anemia due to blood loss. He has an elevated troponin but he denies chest pain. This could be related to strain given his anemia.  I will discuss with cardiology. I discussed this with Dr. Eliberto Ivory, he will admit the patient  ____________________________________________   FINAL CLINICAL IMPRESSION(S) / ED DIAGNOSES  Final diagnoses:  Hematuria, gross  Anemia due to blood loss, acute     Lavonia Drafts, MD 04/27/15 1349

## 2015-04-27 NOTE — Op Note (Signed)
Preoperative diagnosis: 1. Clot urinary retention                                            2. Gross hematuria Postoperative diagnosis: Same  Procedure: 1. Cystoscopy with fulguration of prosthetic fossa                      2. Clot evacuation   Surgeon: Otelia Limes. Yves Dill MD, FACS Anesthesia: Gen.  Indications:See the history and physical. After informed consent the above procedure(s) were requested     Technique and findings: After adequate general anesthesia had been obtained the patient was placed into dorsal lithotomy position and the perineum was prepped and draped in the usual fashion. The resectoscope sheath was placed into the bladder with the obturator in place. The resectoscope was coupled to the camera and placed into the sheath. The rollerball electrode was in place. Bladder was inspected and clots were noted. Using a Toomey syringe the clots were evacuated from the bladder. There was an arterial bleeder at the right bladder neck in the prosthetic fossa. This bleeder was cauterized with the rollerball electrode. There were several small venous bleeders along the posterior prostate fossa and those were also cauterized. At this point there were no active bleeders visible. Bladder was thoroughly inspected. No bladder lesions were identified. Both ureteral orifices were identified and had clear reflux. The resectoscope was removed and 10 cc of viscous Xylocaine instilled within the urethra and the bladder. A 24 French three-way Foley catheter was placed. Catheter had clear drainage. Catheter was irrigated and again clear drainage was obtained. A B&O suppository was placed and the procedure was terminated. The patient was then transferred to the recovery room in stable condition.

## 2015-04-27 NOTE — ED Notes (Signed)
Patient is alert and oriented, family at bedside. Patient denies any discomfort at this time.

## 2015-04-27 NOTE — Anesthesia Postprocedure Evaluation (Signed)
  Anesthesia Post-op Note  Patient: Corey Johnson  Procedure(s) Performed: Procedure(s): CYSTOSCOPY, FULGERATION, CLOT EVACUATION  (N/A)  Anesthesia type:General LMA  Patient location: PACU  Post pain: Pain level controlled  Post assessment: Post-op Vital signs reviewed, Patient's Cardiovascular Status Stable, Respiratory Function Stable, Patent Airway and No signs of Nausea or vomiting  Post vital signs: Reviewed and stable  Last Vitals:  Filed Vitals:   04/27/15 1917  BP:   Pulse: 63  Temp: 36.1 C  Resp: 15    Level of consciousness: awake, alert  and patient cooperative  Complications: No apparent anesthesia complications

## 2015-04-27 NOTE — Anesthesia Procedure Notes (Signed)
Procedure Name: Intubation Date/Time: 04/27/2015 4:37 PM Performed by: Rosaria Ferries, Paytience Bures Pre-anesthesia Checklist: Patient identified, Emergency Drugs available, Suction available and Patient being monitored Patient Re-evaluated:Patient Re-evaluated prior to inductionOxygen Delivery Method: Circle system utilized Preoxygenation: Pre-oxygenation with 100% oxygen Intubation Type: IV induction Laryngoscope Size: Mac and 3 Grade View: Grade II Tube type: Oral Tube size: 7.5 mm Number of attempts: 1 Placement Confirmation: ETT inserted through vocal cords under direct vision,  positive ETCO2 and breath sounds checked- equal and bilateral Secured at: 23 cm Tube secured with: Tape Dental Injury: Teeth and Oropharynx as per pre-operative assessment

## 2015-04-27 NOTE — ED Notes (Signed)
Code 250 (medical emergency) , pt was getting out of his car to be a direct admit , under  Dr. Boneta Lucks , for prostate bleeding , pt pale and weak  , urinary cathetor intact,

## 2015-04-27 NOTE — Transfer of Care (Signed)
Immediate Anesthesia Transfer of Care Note  Patient: Corey Johnson  Procedure(s) Performed: Procedure(s): CYSTOSCOPY, FULGERATION, CLOT EVACUATION  (N/A)  Patient Location: PACU  Anesthesia Type:General  Level of Consciousness: awake, alert , oriented and patient cooperative  Airway & Oxygen Therapy: Patient Spontanous Breathing and Patient connected to nasal cannula oxygen  Post-op Assessment: Report given to RN and Post -op Vital signs reviewed and stable  Post vital signs: Reviewed and stable  Last Vitals:  Filed Vitals:   04/27/15 1728  BP: 125/70  Pulse:   Temp: 37.2 C  Resp: 16    Complications: No apparent anesthesia complications

## 2015-04-27 NOTE — Progress Notes (Signed)
[facility] contacted to verify order for IVF to infuse @ 159ml/hr. MD stated that patient was severely dehydrated and needed to to catch up on fluids, so continue IVF @ 138ml/hr.. Patient with known HTN and CRF.

## 2015-04-27 NOTE — Telephone Encounter (Signed)
Mailed letter for patient to contact office for colon triage

## 2015-04-28 ENCOUNTER — Encounter: Payer: Self-pay | Admitting: Urology

## 2015-04-28 LAB — GLUCOSE, CAPILLARY
GLUCOSE-CAPILLARY: 110 mg/dL — AB (ref 65–99)
GLUCOSE-CAPILLARY: 98 mg/dL (ref 65–99)

## 2015-04-28 LAB — HEMOGLOBIN AND HEMATOCRIT, BLOOD
HEMATOCRIT: 23.6 % — AB (ref 40.0–52.0)
HEMATOCRIT: 28.8 % — AB (ref 40.0–52.0)
HEMOGLOBIN: 8 g/dL — AB (ref 13.0–18.0)
Hemoglobin: 9.8 g/dL — ABNORMAL LOW (ref 13.0–18.0)

## 2015-04-28 LAB — PREPARE RBC (CROSSMATCH)

## 2015-04-28 MED ORDER — CLOTRIMAZOLE-BETAMETHASONE 1-0.05 % EX CREA: TOPICAL_CREAM | Freq: Two times a day (BID) | CUTANEOUS | Status: DC

## 2015-04-28 MED ORDER — HYOSCYAMINE SULFATE 0.125 MG SL SUBL: 0.1250 mg | SUBLINGUAL_TABLET | SUBLINGUAL | Status: DC | PRN

## 2015-04-28 MED ORDER — DOCUSATE SODIUM 100 MG PO CAPS: 200.0000 mg | ORAL_CAPSULE | Freq: Two times a day (BID) | ORAL | Status: DC

## 2015-04-28 MED ORDER — BISACODYL 10 MG RE SUPP: 10.0000 mg | Freq: Every day | RECTAL | Status: DC | PRN

## 2015-04-28 MED ORDER — SODIUM CHLORIDE 0.9 % IV SOLN
Freq: Once | INTRAVENOUS | Status: AC
  Administered 2015-04-28: 10:00:00 via INTRAVENOUS

## 2015-04-28 MED ORDER — CLOTRIMAZOLE-BETAMETHASONE 1-0.05 % EX CREA
TOPICAL_CREAM | Freq: Two times a day (BID) | CUTANEOUS | Status: DC
  Administered 2015-04-28: 10:00:00 via TOPICAL
  Filled 2015-04-28: qty 15

## 2015-04-28 NOTE — Care Management (Signed)
From home with spouse. Independent with Walker, Rolator walker, and bedside commode. Home accessible with rails. Patient states that he uses [facility] in [place] to obtain his medication. Had been open to [place] RN and requested to continue.  Contacted Tiffany from [place] and notified of patients admission and potential discharge.  Per Tiffany no new order necessary to resume care.  No further case management needs identified.  RNCM signing off.

## 2015-04-28 NOTE — Progress Notes (Signed)
Urine clear. Still having some orthostasis. Rash on glans penis.  Lab and vitals noted Plan:  1 unit PRBC. Lotrisone cream . Home this afternoon.

## 2015-04-28 NOTE — Progress Notes (Signed)
Patient d/c'd home with foley per dr Yves Dill. Education provided, no questions at this time. Patient to be picked up by wife. Telemetry removed.  Wilnette Kales

## 2015-04-29 LAB — TYPE AND SCREEN
ABO/RH(D): O POS
ANTIBODY SCREEN: NEGATIVE
UNIT DIVISION: 0
Unit division: 0
Unit division: 0

## 2015-05-01 ENCOUNTER — Telehealth: Payer: Self-pay | Admitting: Gastroenterology

## 2015-05-01 NOTE — Telephone Encounter (Signed)
Gastroenterology Pre-Procedure Review  Request Date:  Requesting Physician: Dr.   PATIENT REVIEW QUESTIONS: The patient responded to the following health history questions as indicated:    1. Are you having any GI issues? no 2. Do you have a personal history of Polyps? yes ( ) 3. Do you have a family history of Colon Cancer or Polyps? YES brother colon cancer 4. Diabetes Mellitus? no 5. Joint replacements in the past 12 months?yes (Left Knee 08/2014) 6. Major health problems in the past 3 months?yes (Gallbladder removed 01/2015,prostate SX Had blood Transfusion) 7. Any artificial heart valves, MVP, or defibrillator?no    MEDICATIONS & ALLERGIES:    Patient reports the following regarding taking any anticoagulation/antiplatelet therapy:   Plavix, Coumadin, Eliquis, Xarelto, Lovenox, Pradaxa, Brilinta, or Effient? no Aspirin? no  Patient confirms/reports the following medications:  Current Outpatient Prescriptions  Medication Sig Dispense Refill   acetaminophen (TYLENOL) 500 MG tablet Take 1,000 mg by mouth every 6 (six) hours as needed for mild pain or moderate pain.     allopurinol (ZYLOPRIM) 100 MG tablet Take 100 mg by mouth daily.     alum & mag hydroxide-simeth (GELUSIL) 200-200-25 MG suspension Chew 1 tablet by mouth 3 (three) times daily as needed for indigestion, heartburn or flatulence. 100 tablet 0   bisacodyl (DULCOLAX) 10 MG suppository Place 1 suppository (10 mg total) rectally daily as needed for moderate constipation. 12 suppository 0   Cholecalciferol 1000 UNITS tablet Take 2,000 Units by mouth daily.      clotrimazole-betamethasone (LOTRISONE) cream Apply topically 2 (two) times daily. 30 g 0   colchicine 0.6 MG tablet Take 0.6 mg by mouth daily as needed. For gout flare ups.     docusate sodium (COLACE) 100 MG capsule Take 1 capsule (100 mg total) by mouth 2 (two) times daily. 10 capsule 0   docusate sodium (COLACE) 100 MG capsule Take 2 capsules (200 mg total)  by mouth 2 (two) times daily. 10 capsule 0   dutasteride (AVODART) 0.5 MG capsule Take 0.5 mg by mouth daily.     enalapril (VASOTEC) 20 MG tablet Take 20 mg by mouth daily.     hydrochlorothiazide (HYDRODIURIL) 25 MG tablet Take 25 mg by mouth daily.     hyoscyamine (LEVSIN SL) 0.125 MG SL tablet Place 1 tablet (0.125 mg total) under the tongue every 4 (four) hours as needed (bladder spasm). 30 tablet 0   levofloxacin (LEVAQUIN) 500 MG tablet Take 1 tablet by mouth daily.     levothyroxine (SYNTHROID, LEVOTHROID) 50 MCG tablet Take 1 tablet (50 mcg total) by mouth daily before breakfast. 10 tablet 0   Meth-Hyo-M Bl-Na Phos-Ph Sal (URIBEL) 118 MG CAPS Take 1 capsule (118 mg total) by mouth every 6 (six) hours as needed. 40 capsule 3   NUCYNTA 50 MG TABS tablet Take 1 tablet (50 mg total) by mouth every 6 (six) hours as needed. 1 TO 2 TABS Q 6 HOURS PRN PAIN 30 tablet 0   omeprazole (PRILOSEC) 20 MG capsule Take 20 mg by mouth daily as needed (for heartburn/indigestion).      ondansetron (ZOFRAN ODT) 8 MG disintegrating tablet Take 1 tablet (8 mg total) by mouth every 6 (six) hours as needed for nausea or vomiting. 10 tablet 3   pravastatin (PRAVACHOL) 10 MG tablet Take 10 mg by mouth at bedtime.      rOPINIRole (REQUIP) 1 MG tablet Take 2 mg by mouth every evening.      tamsulosin (FLOMAX) 0.4  MG CAPS capsule Take 0.4 mg by mouth daily.     No current facility-administered medications for this visit.    Patient confirms/reports the following allergies:  Allergies  Allergen Reactions   Lisinopril     Other reaction(s): Other (See Comments) hyperkalemia   Metoprolol Other (See Comments)    Other reaction(s): Other (See Comments) severe cramps   Tapentadol     Other reaction(s): Hallucination   Tramadol Other (See Comments)   Nsaids Other (See Comments)    Kidney disease   Simvastatin Other (See Comments)    fatigue    No orders of the defined types were placed in  this encounter.    AUTHORIZATION INFORMATION Primary Insurance: 1D#: Group #:  Secondary Insurance: 1D#: Group #:  SCHEDULE INFORMATION: Date:  Time: Location:

## 2015-05-02 ENCOUNTER — Other Ambulatory Visit: Payer: Self-pay

## 2015-05-02 NOTE — Telephone Encounter (Signed)
Pt scheduled for a colonoscopy on 06-06-15. Instructs/rx mailed.

## 2015-05-09 NOTE — Discharge Summary (Signed)
Corey Johnson, 74 y.o., DOB Sep 27, 1940, MRN [id number]. Admission date: 04/22/2015 Discharge Date 05/09/2015 Primary MD Casilda Carls, MD Admitting Physician Demetrios Loll, MD  Admission Diagnosis  Leukocytosis [D72.829] Lactic acid acidosis [E87.2] Gross hematuria [R31.0] SIRS (systemic inflammatory response syndrome) [O27.0] Metabolic acidemia [J50.0] Hematuria [R31.9] Hematoma of bladder wall, initial encounter [S37.22XA]  Discharge Diagnosis   Principal Problem:   Sepsis Active Problems:   UTI (lower urinary tract infection)   Hematuria  chronic kidney disease stage III Anemia of chronic disease            Hospital Course [name] is a 74 y.o. male with a known history of hypertension, CKD and BPH. Patient recently had the prostate surgery for BPH 1 months ago. On the day of admission lot of bloody urine. He also mentioned that he has had cloudy urine for the past 3 days. in addition, he has fever and chills for the past 2-3 days. Patient also noted to have leukocytosis and tachycardia. Patient was noted to have a urinary tract infection was treated with anabiotic's. He was seen by urology who recommended 3-way Foley irrigation. With this intervention his hematuria resolved. I spoke to Dr. Allen Norris for recommended keeping Foley in place. An outpatient follow-up.          Consults  urology  Significant Tests:  See full reports for all details    Ct Abdomen Pelvis W Wo Contrast  04/22/2015   CLINICAL DATA:  Dysuria and gross hematuria  EXAM: CT ABDOMEN AND PELVIS WITHOUT AND WITH CONTRAST  TECHNIQUE: Multidetector CT imaging of the abdomen and pelvis was performed following the standard protocol before and following the bolus administration of intravenous contrast.  CONTRAST:  131mL OMNIPAQUE IOHEXOL 300 MG/ML  SOLN  COMPARISON:  02/13/2015  FINDINGS: Lung bases are free of acute infiltrate or sizable effusion. No focal parenchymal nodule is seen.  The precontrast images show no  evidence of renal calculi or obstructive changes. The appendix is within normal limits.  Postcontrast images show the liver, spleen, adrenal glands and pancreas to be within normal limits.  Kidneys are well visualized and again demonstrate multiple renal cysts bilaterally. No significant enhancement is noted within the cysts. No calcifications or mural components are seen. On the prone delayed images some decreased attenuation is noted within the left renal collecting system posteriorly. There is a smooth meniscus identified within the renal pelvis on the left in this likely represents opacified and non-opacified urine. The possibility however of a small mural lesion cannot be totally excluded on the basis of this exam.  The bladder is well distended and demonstrates a large hyperdense area. This was not well appreciated on the prior CT examination and is most consistent with a large hematoma. A small amount of air is noted within the bladder from Foley catheter placement.  No free pelvic fluid is seen. No lymphadenopathy is noted. Diffuse aortoiliac calcifications are seen. The appendix is again well visualized and within normal limits. The bony structures show no acute abnormality. Degenerative changes of the lumbar spine are seen.  IMPRESSION: Changes consistent with a large hematoma within the urinary bladder.  Multiple renal cysts which appears simple in nature.  Differential attenuation on the delayed images within the renal pelvis on the left as described. This likely represents opacified and non-opacified urine although the possibility of a small mural lesion cannot be totally excluded. Retrograde pyelography may be helpful in this regard.   Electronically Signed   By: Elta Guadeloupe  Lukens M.D.   On: 04/22/2015 16:59   Dg Chest 2 View  04/22/2015   CLINICAL DATA:  Leukocytosis.  EXAM: CHEST  2 VIEW  COMPARISON:  August 04, 2012.  FINDINGS: The heart size and mediastinal contours are within normal limits. Both  lungs are clear. No pneumothorax or pleural effusion is noted. The visualized skeletal structures are unremarkable.  IMPRESSION: No active cardiopulmonary disease.   Electronically Signed   By: Marijo Conception, M.D.   On: 04/22/2015 15:56       Today   Subjective:   Corey Johnson  feels better denies any complaints was to go home  Objective:   Blood pressure 148/75, pulse 69, temperature 98.2 F (36.8 C), temperature source Oral, resp. rate 17, height 5\' 9"  (1.753 m), weight 109.952 kg (242 lb 6.4 oz), SpO2 100 %.  . No intake or output data in the 24 hours ending 05/09/15 1257  Exam VITAL SIGNS: Blood pressure 148/75, pulse 69, temperature 98.2 F (36.8 C), temperature source Oral, resp. rate 17, height 5\' 9"  (1.753 m), weight 109.952 kg (242 lb 6.4 oz), SpO2 100 %.  GENERAL:  74 y.o.-year-old patient lying in the bed with no acute distress.  EYES: Pupils equal, round, reactive to light and accommodation. No scleral icterus. Extraocular muscles intact.  HEENT: Head atraumatic, normocephalic. Oropharynx and nasopharynx clear.  NECK:  Supple, no jugular venous distention. No thyroid enlargement, no tenderness.  LUNGS: Normal breath sounds bilaterally, no wheezing, rales,rhonchi or crepitation. No use of accessory muscles of respiration.  CARDIOVASCULAR: S1, S2 normal. No murmurs, rubs, or gallops.  ABDOMEN: Soft, nontender, nondistended. Bowel sounds present. No organomegaly or mass.  EXTREMITIES: No pedal edema, cyanosis, or clubbing.  NEUROLOGIC: Cranial nerves II through XII are intact. Muscle strength 5/5 in all extremities. Sensation intact. Gait not checked.  PSYCHIATRIC: The patient is alert and oriented x 3.  SKIN: No obvious rash, lesion, or ulcer.   Data Review     CBC w Diff:  Lab Results  Component Value Date   WBC 12.6* 04/27/2015   WBC 11.1* 09/11/2014   HGB 9.8* 04/28/2015   HGB 9.6* 09/11/2014   HCT 28.8* 04/28/2015   HCT 29.1* 09/11/2014   PLT 377  04/27/2015   PLT 260 09/11/2014   LYMPHOPCT 4% 02/05/2015   LYMPHOPCT 9.6 09/11/2014   MONOPCT 7% 02/05/2015   MONOPCT 10.1 09/11/2014   EOSPCT 1% 02/05/2015   EOSPCT 3.7 09/11/2014   BASOPCT 0% 02/05/2015   BASOPCT 0.3 09/11/2014   CMP:  Lab Results  Component Value Date   NA 136 04/27/2015   NA 134* 09/09/2014   K 3.7 04/27/2015   K 4.3 09/09/2014   CL 99* 04/27/2015   CL 100 09/09/2014   CO2 26 04/27/2015   CO2 28 09/09/2014   BUN 21* 04/27/2015   BUN 17 09/09/2014   CREATININE 2.15* 04/27/2015   CREATININE 1.59* 09/09/2014   PROT 6.7 04/27/2015   PROT 7.9 08/04/2012   ALBUMIN 3.2* 04/27/2015   ALBUMIN 4.1 08/04/2012   BILITOT 0.2* 04/27/2015   BILITOT 0.5 08/04/2012   ALKPHOS 61 04/27/2015   ALKPHOS 107 08/04/2012   AST 27 04/27/2015   AST 23 08/04/2012   ALT 11* 04/27/2015   ALT 20 08/04/2012  .  Micro Results No results found for this or any previous visit (from the past 240 hour(s)).         Follow-up Information    Follow up with Royston Cowper, MD On  04/27/2015.   Specialty:  Urology   Why:  Daughter already made appointment for 04/27/15 at 9:45am with Dr. Harriette Bouillon information:   [place] [address] [phone]       Follow up with Casilda Carls, MD. Go on 05/03/2015.   Specialty:  Internal Medicine   Why:  You a follow up with Dr. Rosario Jacks at 11:30am 05/03/15.   Contact information:   [place] [address] [phone]       Discharge Medications     Medication List    STOP taking these medications        ferrous sulfate 325 (65 FE) MG EC tablet     levofloxacin 500 MG tablet  Commonly known as:  LEVAQUIN      TAKE these medications        acetaminophen 500 MG tablet  Commonly known as:  TYLENOL  Take 1,000 mg by mouth every 6 (six) hours as needed for mild pain or moderate pain.     allopurinol 100 MG tablet  Commonly known as:  ZYLOPRIM  Take 100 mg by mouth daily.     alum &  mag hydroxide-simeth 200-200-25 MG suspension  Commonly known as:  GELUSIL  Chew 1 tablet by mouth 3 (three) times daily as needed for indigestion, heartburn or flatulence.     Cholecalciferol 1000 UNITS tablet  Take 2,000 Units by mouth daily.     colchicine 0.6 MG tablet  Take 0.6 mg by mouth daily as needed. For gout flare ups.     docusate sodium 100 MG capsule  Commonly known as:  COLACE  Take 1 capsule (100 mg total) by mouth 2 (two) times daily.     dutasteride 0.5 MG capsule  Commonly known as:  AVODART  Take 0.5 mg by mouth daily.     enalapril 20 MG tablet  Commonly known as:  VASOTEC  Take 20 mg by mouth daily.     hydrochlorothiazide 25 MG tablet  Commonly known as:  HYDRODIURIL  Take 25 mg by mouth daily.     levothyroxine 50 MCG tablet  Commonly known as:  SYNTHROID, LEVOTHROID  Take 1 tablet (50 mcg total) by mouth daily before breakfast.     NUCYNTA 50 MG Tabs tablet  Generic drug:  tapentadol  Take 1 tablet (50 mg total) by mouth every 6 (six) hours as needed. 1 TO 2 TABS Q 6 HOURS PRN PAIN     omeprazole 20 MG capsule  Commonly known as:  PRILOSEC  Take 20 mg by mouth daily as needed (for heartburn/indigestion).     ondansetron 8 MG disintegrating tablet  Commonly known as:  ZOFRAN ODT  Take 1 tablet (8 mg total) by mouth every 6 (six) hours as needed for nausea or vomiting.     pravastatin 10 MG tablet  Commonly known as:  PRAVACHOL  Take 10 mg by mouth at bedtime.     rOPINIRole 1 MG tablet  Commonly known as:  REQUIP  Take 2 mg by mouth every evening.     tamsulosin 0.4 MG Caps capsule  Commonly known as:  FLOMAX  Take 0.4 mg by mouth daily.     URIBEL 118 MG Caps  Take 1 capsule (118 mg total) by mouth every 6 (six) hours as needed.           Total Time in preparing paper work, data evaluation and todays exam - 35 minutes  Talise Sligh,  [facility] M.D on 05/09/2015 at 12:57 PM  [facility]   Office  [phone]

## 2015-06-13 ENCOUNTER — Ambulatory Visit
Admission: RE | Admit: 2015-06-13 | Discharge: 2015-06-13 | Disposition: A | Discharge status: Home / Self Care | Payer: Medicare HMO | Source: Ambulatory Visit | Attending: Gastroenterology | Admitting: Gastroenterology

## 2015-06-13 ENCOUNTER — Ambulatory Visit: Payer: Medicare HMO | Admitting: Anesthesiology

## 2015-06-13 ENCOUNTER — Encounter
Admission: RE | Disposition: A | Discharge status: Home / Self Care | Payer: Self-pay | Source: Ambulatory Visit | Attending: Gastroenterology

## 2015-06-13 ENCOUNTER — Encounter: Payer: Self-pay | Admitting: Anesthesiology

## 2015-06-13 DIAGNOSIS — G4730 Sleep apnea, unspecified: Secondary | ICD-10-CM | POA: Insufficient documentation

## 2015-06-13 DIAGNOSIS — E559 Vitamin D deficiency, unspecified: Secondary | ICD-10-CM | POA: Diagnosis not present

## 2015-06-13 DIAGNOSIS — Z1211 Encounter for screening for malignant neoplasm of colon: Secondary | ICD-10-CM | POA: Diagnosis not present

## 2015-06-13 DIAGNOSIS — Z8249 Family history of ischemic heart disease and other diseases of the circulatory system: Secondary | ICD-10-CM | POA: Insufficient documentation

## 2015-06-13 DIAGNOSIS — Z825 Family history of asthma and other chronic lower respiratory diseases: Secondary | ICD-10-CM | POA: Insufficient documentation

## 2015-06-13 DIAGNOSIS — I129 Hypertensive chronic kidney disease with stage 1 through stage 4 chronic kidney disease, or unspecified chronic kidney disease: Secondary | ICD-10-CM | POA: Diagnosis not present

## 2015-06-13 DIAGNOSIS — Z860100 Personal history of colon polyps, unspecified: Secondary | ICD-10-CM | POA: Insufficient documentation

## 2015-06-13 DIAGNOSIS — D123 Benign neoplasm of transverse colon: Secondary | ICD-10-CM | POA: Diagnosis not present

## 2015-06-13 DIAGNOSIS — Z96652 Presence of left artificial knee joint: Secondary | ICD-10-CM | POA: Diagnosis not present

## 2015-06-13 DIAGNOSIS — M109 Gout, unspecified: Secondary | ICD-10-CM | POA: Diagnosis not present

## 2015-06-13 DIAGNOSIS — Z888 Allergy status to other drugs, medicaments and biological substances status: Secondary | ICD-10-CM | POA: Diagnosis not present

## 2015-06-13 DIAGNOSIS — E785 Hyperlipidemia, unspecified: Secondary | ICD-10-CM | POA: Insufficient documentation

## 2015-06-13 DIAGNOSIS — Z9049 Acquired absence of other specified parts of digestive tract: Secondary | ICD-10-CM | POA: Insufficient documentation

## 2015-06-13 DIAGNOSIS — K5730 Diverticulosis of large intestine without perforation or abscess without bleeding: Secondary | ICD-10-CM | POA: Insufficient documentation

## 2015-06-13 DIAGNOSIS — Z800 Family history of malignant neoplasm of digestive organs: Secondary | ICD-10-CM | POA: Insufficient documentation

## 2015-06-13 DIAGNOSIS — K621 Rectal polyp: Secondary | ICD-10-CM | POA: Diagnosis not present

## 2015-06-13 DIAGNOSIS — Z87891 Personal history of nicotine dependence: Secondary | ICD-10-CM | POA: Insufficient documentation

## 2015-06-13 DIAGNOSIS — Z79899 Other long term (current) drug therapy: Secondary | ICD-10-CM | POA: Insufficient documentation

## 2015-06-13 DIAGNOSIS — D649 Anemia, unspecified: Secondary | ICD-10-CM | POA: Insufficient documentation

## 2015-06-13 DIAGNOSIS — K219 Gastro-esophageal reflux disease without esophagitis: Secondary | ICD-10-CM | POA: Diagnosis not present

## 2015-06-13 DIAGNOSIS — N189 Chronic kidney disease, unspecified: Secondary | ICD-10-CM | POA: Insufficient documentation

## 2015-06-13 DIAGNOSIS — N400 Enlarged prostate without lower urinary tract symptoms: Secondary | ICD-10-CM | POA: Insufficient documentation

## 2015-06-13 DIAGNOSIS — M1990 Unspecified osteoarthritis, unspecified site: Secondary | ICD-10-CM | POA: Insufficient documentation

## 2015-06-13 DIAGNOSIS — L409 Psoriasis, unspecified: Secondary | ICD-10-CM | POA: Insufficient documentation

## 2015-06-13 HISTORY — PX: COLONOSCOPY WITH PROPOFOL: SHX5780

## 2015-06-13 SURGERY — COLONOSCOPY WITH PROPOFOL
Anesthesia: General

## 2015-06-13 MED ORDER — PROPOFOL 10 MG/ML IV BOLUS
INTRAVENOUS | Status: DC | PRN
  Administered 2015-06-13: 20 mg via INTRAVENOUS
  Administered 2015-06-13: 30 mg via INTRAVENOUS

## 2015-06-13 MED ORDER — SODIUM CHLORIDE 0.9 % IV SOLN
INTRAVENOUS | Status: DC
  Administered 2015-06-13: 10:00:00 via INTRAVENOUS

## 2015-06-13 MED ORDER — PROPOFOL 500 MG/50ML IV EMUL
INTRAVENOUS | Status: DC | PRN
  Administered 2015-06-13: 75 ug/kg/min via INTRAVENOUS

## 2015-06-13 NOTE — Op Note (Signed)
[facility] Gastroenterology Patient Name: Corey Johnson Procedure Date: 06/13/2015 10:26 AM MRN: [id number] Account #: [id number] Date of Birth: Jun 03, 1941 Admit Type: Outpatient Age: 74 Room: [facility] ENDO ROOM 4 Gender: Male Note Status: Finalized Procedure:         Colonoscopy Indications:       High risk colon cancer surveillance: Personal history of                     colonic polyps Providers:         Lucilla Lame, MD Referring MD:      Casilda Carls, MD (Referring MD) Medicines:         Propofol per Anesthesia Complications:     No immediate complications. Procedure:         Pre-Anesthesia Assessment:                    - Prior to the procedure, a History and Physical was                     performed, and patient medications and allergies were                     reviewed. The patient's tolerance of previous anesthesia                     was also reviewed. The risks and benefits of the procedure                     and the sedation options and risks were discussed with the                     patient. All questions were answered, and informed consent                     was obtained. Prior Anticoagulants: The patient has taken                     no previous anticoagulant or antiplatelet agents. ASA                     Grade Assessment: II - A patient with mild systemic                     disease. After reviewing the risks and benefits, the                     patient was deemed in satisfactory condition to undergo                     the procedure.                    After obtaining informed consent, the colonoscope was                     passed under direct vision. Throughout the procedure, the                     patient's blood pressure, pulse, and oxygen saturations                     were monitored continuously. The Colonoscope was  introduced through the anus and advanced to the the cecum,                     identified by  appendiceal orifice and ileocecal valve. The                     colonoscopy was performed without difficulty. The patient                     tolerated the procedure well. The quality of the bowel                     preparation was excellent. Findings:      The perianal and digital rectal examinations were normal.      A 4 mm polyp was found in the rectum. The polyp was sessile. The polyp       was removed with a cold biopsy forceps. Resection and retrieval were       complete.      A 5 mm polyp was found in the transverse colon. The polyp was sessile.       The polyp was removed with a cold snare. Resection and retrieval were       complete.      A 3 mm polyp was found in the transverse colon. The polyp was sessile.       The polyp was removed with a cold biopsy forceps. Resection and       retrieval were complete.      Many small-mouthed diverticula were found in the sigmoid colon. Impression:        - One 4 mm polyp in the rectum. Resected and retrieved.                    - One 5 mm polyp in the transverse colon. Resected and                     retrieved.                    - One 3 mm polyp in the transverse colon. Resected and                     retrieved.                    - Diverticulosis in the sigmoid colon. Recommendation:    - Await pathology results. Procedure Code(s): --- Professional ---                    [phone], Colonoscopy, flexible; with removal of tumor(s),                     polyp(s), or other lesion(s) by snare technique                    45380, 6, Colonoscopy, flexible; with biopsy, single or                     multiple Diagnosis Code(s): --- Professional ---                    Z86.010, Personal history of colonic polyps                    K62.1, Rectal polyp  D12.3, Benign neoplasm of transverse colon CPT copyright 2014 [employer]. All rights reserved. The codes documented in this report are preliminary and upon coder  review may  be revised to meet current compliance requirements. Lucilla Lame, MD 06/13/2015 10:51:15 AM This report has been signed electronically. Number of Addenda: 0 Note Initiated On: 06/13/2015 10:26 AM Scope Withdrawal Time: 0 hours 9 minutes 26 seconds  Total Procedure Duration: 0 hours 15 minutes 0 seconds       [facility]

## 2015-06-13 NOTE — H&P (Signed)
[facility]  [address], [place], [place] [address] Phone: [phone] Fax : [phone]  Primary Care Physician:  Corey Carls, MD Primary Gastroenterologist:  Dr. Allen Johnson  Pre-Procedure History & Physical: HPI:  Corey Johnson is a 74 y.o. male is here for an colonoscopy.   Past Medical History  Diagnosis Date  . Arthritis   . Hypertension   . Renal disorder   . Sleep apnea     CPAP  . Gout   . Psoriasis   . Vitamin D deficiency   . BPH (benign prostatic hyperplasia)   . Hyperlipemia   . CRF (chronic renal failure)   . H. pylori infection   . Renal insufficiency     Past Surgical History  Procedure Laterality Date  . Cholecystectomy N/A 02/06/2015    Procedure: LAPAROSCOPIC CHOLECYSTECTOMY WITH INTRAOPERATIVE CHOLANGIOGRAM;  Surgeon: Dia Crawford III, MD;  Location: [facility] ORS;  Service: General;  Laterality: N/A;  . Shoulder surgery Right   . Knee arthroscopy    . Transurethral resection of prostate    . Replacement total knee    . Joint replacement      left knee  . Green light laser turp (transurethral resection of prostate N/A 03/28/2015    Procedure: GREEN LIGHT LASER TURP (TRANSURETHRAL RESECTION OF PROSTATE;  Surgeon: Royston Cowper, MD;  Location: [facility] ORS;  Service: Urology;  Laterality: N/A;  . Cystoscopy N/A 04/27/2015    Procedure: Albertine Grates, CLOT EVACUATION ;  Surgeon: Royston Cowper, MD;  Location: [facility] ORS;  Service: Urology;  Laterality: N/A;    Prior to Admission medications   Medication Sig Start Date End Date Taking? Authorizing Provider  acetaminophen (TYLENOL) 500 MG tablet Take 1,000 mg by mouth every 6 (six) hours as needed for mild pain or moderate pain.    Historical Provider, MD  allopurinol (ZYLOPRIM) 100 MG tablet Take 100 mg by mouth daily. 04/11/15   Historical Provider, MD  alum & mag hydroxide-simeth (GELUSIL) 599-357-01 MG suspension Chew 1 tablet by mouth 3 (three) times daily as needed for indigestion,  heartburn or flatulence. 04/24/15   Dustin Flock, MD  bisacodyl (DULCOLAX) 10 MG suppository Place 1 suppository (10 mg total) rectally daily as needed for moderate constipation. 04/28/15   Royston Cowper, MD  Cholecalciferol 1000 UNITS tablet Take 2,000 Units by mouth daily.     Historical Provider, MD  clotrimazole-betamethasone (LOTRISONE) cream Apply topically 2 (two) times daily. 04/28/15   Royston Cowper, MD  colchicine 0.6 MG tablet Take 0.6 mg by mouth daily as needed. For gout flare ups.    Historical Provider, MD  docusate sodium (COLACE) 100 MG capsule Take 1 capsule (100 mg total) by mouth 2 (two) times daily. 02/17/15   Dustin Flock, MD  docusate sodium (COLACE) 100 MG capsule Take 2 capsules (200 mg total) by mouth 2 (two) times daily. 04/28/15   Royston Cowper, MD  dutasteride (AVODART) 0.5 MG capsule Take 0.5 mg by mouth daily.    Historical Provider, MD  enalapril (VASOTEC) 20 MG tablet Take 20 mg by mouth daily.    Historical Provider, MD  hydrochlorothiazide (HYDRODIURIL) 25 MG tablet Take 25 mg by mouth daily.    Historical Provider, MD  hyoscyamine (LEVSIN SL) 0.125 MG SL tablet Place 1 tablet (0.125 mg total) under the tongue every 4 (four) hours as needed (bladder spasm). 04/28/15   Royston Cowper, MD  levofloxacin (LEVAQUIN) 500 MG tablet Take 1 tablet by mouth  daily. 04/25/15   Historical Provider, MD  levothyroxine (SYNTHROID, LEVOTHROID) 50 MCG tablet Take 1 tablet (50 mcg total) by mouth daily before breakfast. 04/24/15 04/29/15  Dustin Flock, MD  Meth-Hyo-M Barnett Hatter Phos-Ph Sal (URIBEL) 118 MG CAPS Take 1 capsule (118 mg total) by mouth every 6 (six) hours as needed. 03/28/15   Royston Cowper, MD  NUCYNTA 50 MG TABS tablet Take 1 tablet (50 mg total) by mouth every 6 (six) hours as needed. 1 TO 2 TABS Q 6 HOURS PRN PAIN 03/28/15   Royston Cowper, MD  omeprazole (PRILOSEC) 20 MG capsule Take 20 mg by mouth daily as needed (for heartburn/indigestion).     Historical Provider,  MD  ondansetron (ZOFRAN ODT) 8 MG disintegrating tablet Take 1 tablet (8 mg total) by mouth every 6 (six) hours as needed for nausea or vomiting. 03/28/15   Royston Cowper, MD  pravastatin (PRAVACHOL) 10 MG tablet Take 10 mg by mouth at bedtime.     Historical Provider, MD  rOPINIRole (REQUIP) 1 MG tablet Take 2 mg by mouth every evening.     Historical Provider, MD  tamsulosin (FLOMAX) 0.4 MG CAPS capsule Take 0.4 mg by mouth daily.    Historical Provider, MD    Allergies as of 05/02/2015 - Review Complete 04/27/2015  Allergen Reaction Noted  . Lisinopril  04/22/2015  . Metoprolol Other (See Comments) 04/22/2015  . Tapentadol  04/22/2015  . Tramadol Other (See Comments) 04/22/2015  . Nsaids Other (See Comments) 03/28/2015  . Simvastatin Other (See Comments) 04/22/2015    Family History  Problem Relation Age of Onset  . Hypertension Mother   . Pneumonia Father     Social History   Social History  . Marital Status: Married    Spouse Name: N/A  . Number of Children: N/A  . Years of Education: N/A   Occupational History  . Not on file.   Social History Main Topics  . Smoking status: Former Smoker -- 20 years    Types: Cigarettes  . Smokeless tobacco: Never Used  . Alcohol Use: No  . Drug Use: No  . Sexual Activity: Not on file   Other Topics Concern  . Not on file   Social History Narrative    Review of Systems: See HPI, otherwise negative ROS  Physical Exam: BP 153/109 mmHg  Pulse 85  Temp(Src) 98.2 F (36.8 C) (Oral)  Resp 19  Ht 5\' 9"  (1.753 m)  Wt 240 lb (108.863 kg)  BMI 35.43 kg/m2  SpO2 98% General:   Alert,  pleasant and cooperative in NAD Head:  Normocephalic and atraumatic. Neck:  Supple; no masses or thyromegaly. Lungs:  Clear throughout to auscultation.    Heart:  Regular rate and rhythm. Abdomen:  Soft, nontender and nondistended. Normal bowel sounds, without guarding, and without rebound.   Neurologic:  Alert and  oriented x4;  grossly  normal neurologically.  Impression/Plan: Corey Johnson is here for an colonoscopy to be performed for history of colon polyps.  Risks, benefits, limitations, and alternatives regarding  colonoscopy have been reviewed with the patient.  Questions have been answered.  All parties agreeable.   Ollen Bowl, MD  06/13/2015, 10:20 AM

## 2015-06-13 NOTE — Transfer of Care (Signed)
Immediate Anesthesia Transfer of Care Note  Patient: Corey Johnson  Procedure(s) Performed: Procedure(s): COLONOSCOPY WITH PROPOFOL (N/A)  Patient Location: PACU  Anesthesia Type:General  Level of Consciousness: sedated  Airway & Oxygen Therapy: Patient Spontanous Breathing and Patient connected to nasal cannula oxygen  Post-op Assessment: Report given to RN and Post -op Vital signs reviewed and stable  Post vital signs: Reviewed and stable  Last Vitals:  Filed Vitals:   06/13/15 0944  BP: 153/109  Pulse: 85  Temp: 36.8 C  Resp: 19    Complications: No apparent anesthesia complications

## 2015-06-13 NOTE — Anesthesia Preprocedure Evaluation (Addendum)
Anesthesia Evaluation  Patient identified by MRN, date of birth, ID band Patient awake    Reviewed: Allergy & Precautions, H&P , NPO status , Patient's Chart, lab work & pertinent test results, reviewed documented beta blocker date and time   Airway Mallampati: I  TM Distance: >3 FB Neck ROM: limited    Dental  (+) Poor Dentition, Chipped, Missing   Pulmonary sleep apnea and Continuous Positive Airway Pressure Ventilation , former smoker,    Pulmonary exam normal breath sounds clear to auscultation       Cardiovascular Exercise Tolerance: Good hypertension, Normal cardiovascular exam Rhythm:regular Rate:Normal     Neuro/Psych negative neurological ROS  negative psych ROS   GI/Hepatic Neg liver ROS, GERD  Controlled,  Endo/Other  negative endocrine ROS  Renal/GU CRFRenal disease  negative genitourinary   Musculoskeletal  (+) Arthritis ,   Abdominal   Peds  Hematology  (+) anemia ,   Anesthesia Other Findings Past Medical History:   Arthritis                                                    Hypertension                                                 Renal disorder                                               Sleep apnea                                                    Comment:CPAP   Gout                                                         Psoriasis                                                    Vitamin D deficiency                                         BPH (benign prostatic hyperplasia)                           Hyperlipemia  CRF (chronic renal failure)                                  H. pylori infection                                      Reproductive/Obstetrics negative OB ROS                            Anesthesia Physical  Anesthesia Plan  ASA: III  Anesthesia Plan: General   Post-op Pain Management:     Induction:   Airway Management Planned:   Additional Equipment:   Intra-op Plan:   Post-operative Plan:   Informed Consent: I have reviewed the patients History and Physical, chart, labs and discussed the procedure including the risks, benefits and alternatives for the proposed anesthesia with the patient or authorized representative who has indicated his/her understanding and acceptance.   Dental Advisory Given  Plan Discussed with: Anesthesiologist, CRNA and Surgeon  Anesthesia Plan Comments: (Plan to transfuse in the OR.)       Anesthesia Quick Evaluation

## 2015-06-14 LAB — SURGICAL PATHOLOGY

## 2015-06-15 NOTE — Anesthesia Postprocedure Evaluation (Signed)
  Anesthesia Post-op Note  Patient: Corey Johnson  Procedure(s) Performed: Procedure(s): COLONOSCOPY WITH PROPOFOL (N/A)  Anesthesia type:General  Patient location: PACU  Post pain: Pain level controlled  Post assessment: Post-op Vital signs reviewed, Patient's Cardiovascular Status Stable, Respiratory Function Stable, Patent Airway and No signs of Nausea or vomiting  Post vital signs: Reviewed and stable  Last Vitals:  Filed Vitals:   06/13/15 1130  BP: 159/75  Pulse: 65  Temp:   Resp: 16    Level of consciousness: awake, alert  and patient cooperative  Complications: No apparent anesthesia complications

## 2015-06-19 ENCOUNTER — Encounter: Payer: Self-pay | Admitting: Gastroenterology

## 2017-04-09 IMAGING — CT CT ABD-PELV W/O CM
1 of 2 series · 14 of 32 positions shown, 18 images · non-contrast
Comparison: 02/05/2015

CLINICAL DATA: Abdominal distension. Cholecystectomy 6 days ago.
Constipation, leukocytosis, and dysuria.

EXAM:
CT ABDOMEN AND PELVIS WITHOUT CONTRAST
TECHNIQUE: Multidetector CT imaging of the abdomen and pelvis was performed
following the standard protocol without IV contrast.

[Series 2: routine abd pel without · axial · non-contrast · 0.81mm/px · z∈[-496,-20]mm · 14 of 105 slices shown, 18 images]
[im 5/105  soft-tissue]
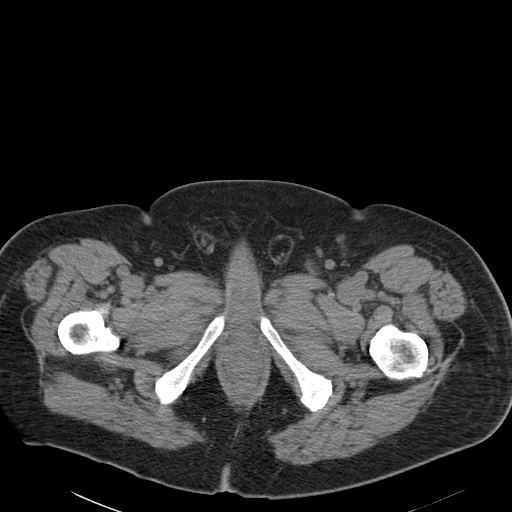
[im 5/105  bone]
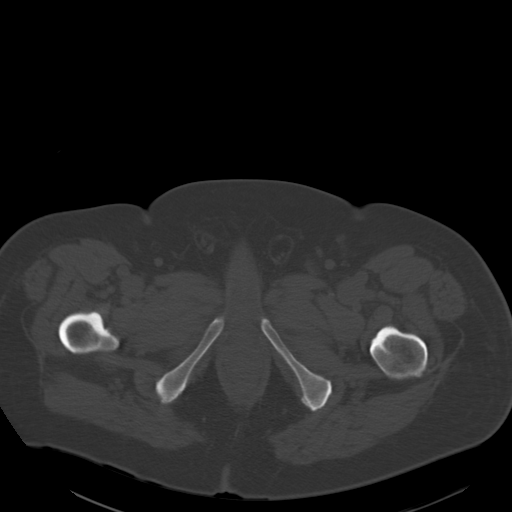
[im 14/105  soft-tissue]
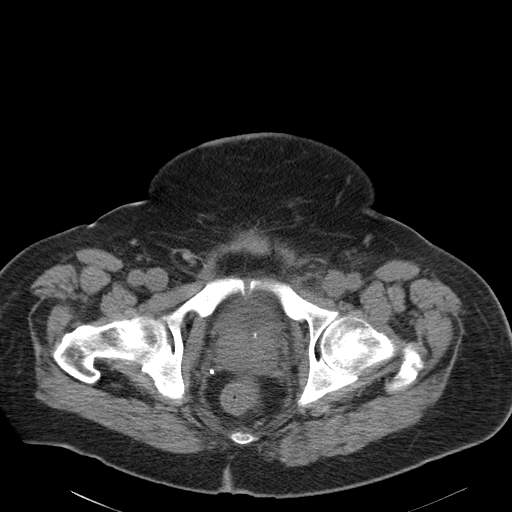
[im 23/105  soft-tissue]
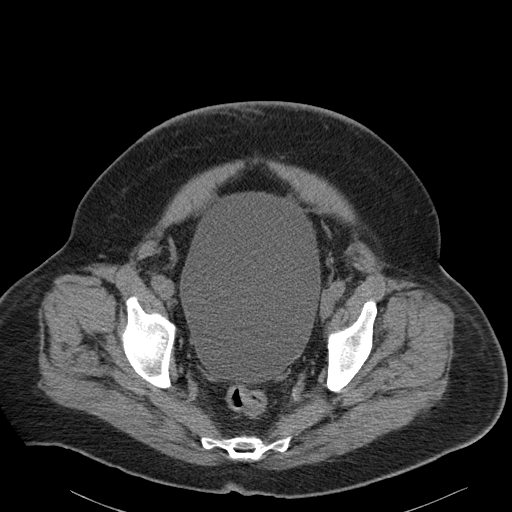
[im 32/105  soft-tissue]
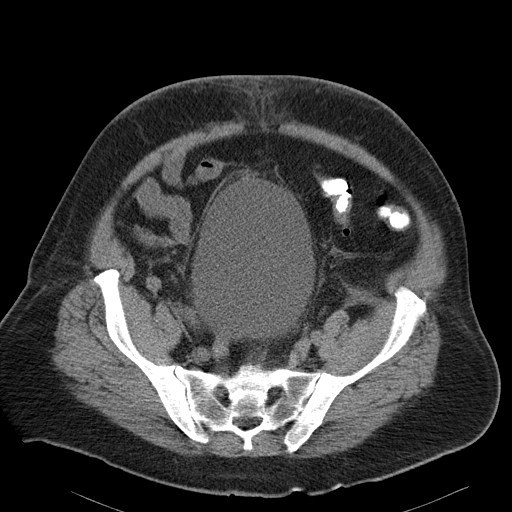
[im 41/105  soft-tissue]
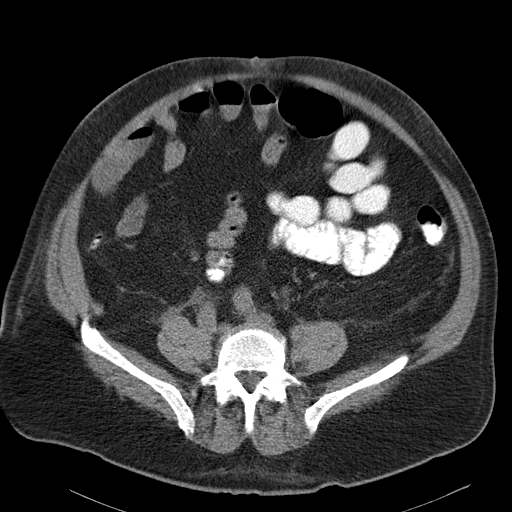
[im 50/105  soft-tissue]
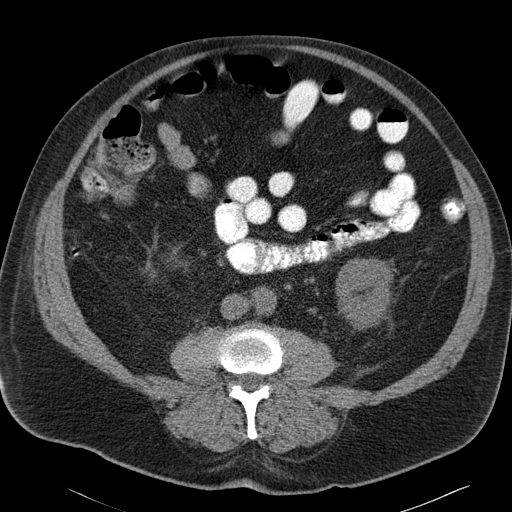
[im 55/105  soft-tissue]
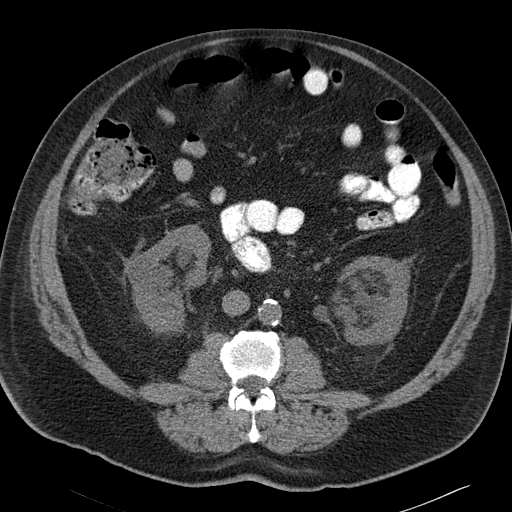
[im 64/105  soft-tissue]
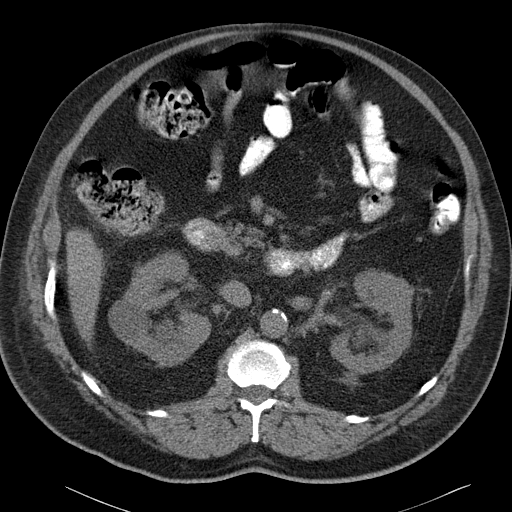
[im 73/105  soft-tissue]
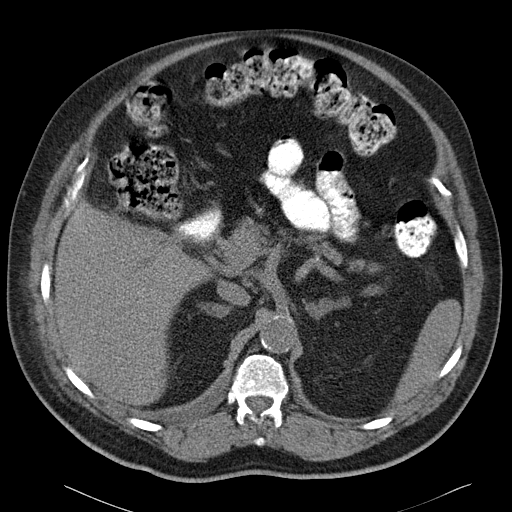
[im 73/105  bone]
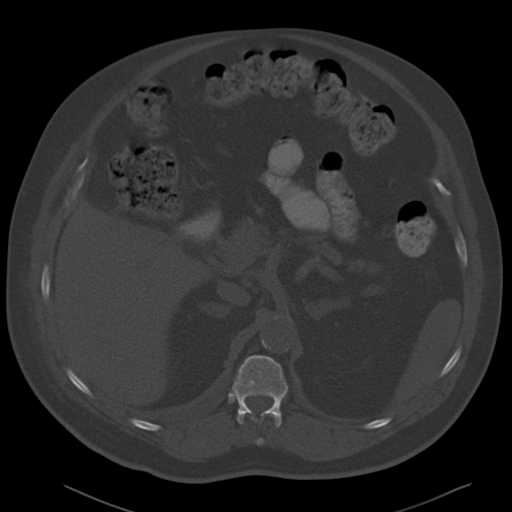
[im 82/105  soft-tissue]
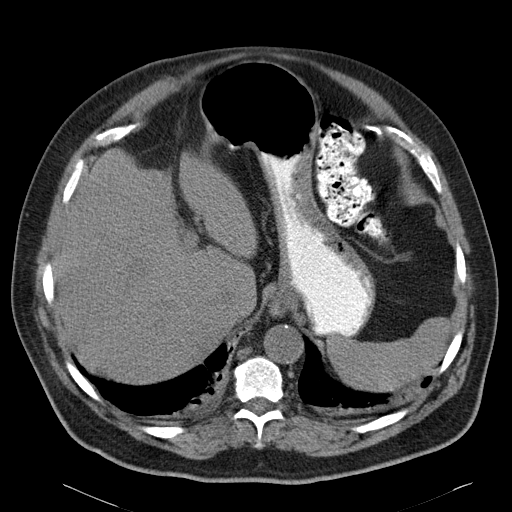
[im 86/105  lung]
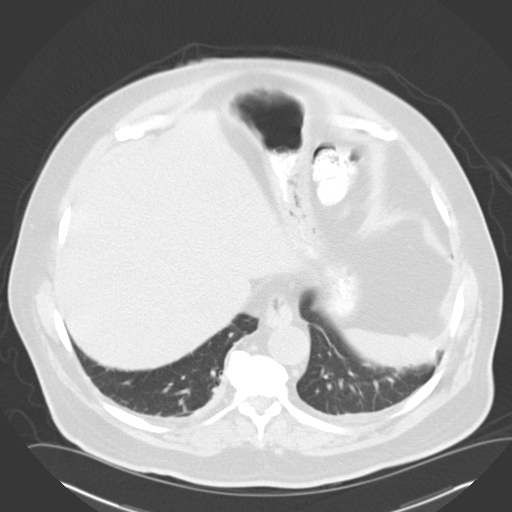
[im 91/105  soft-tissue]
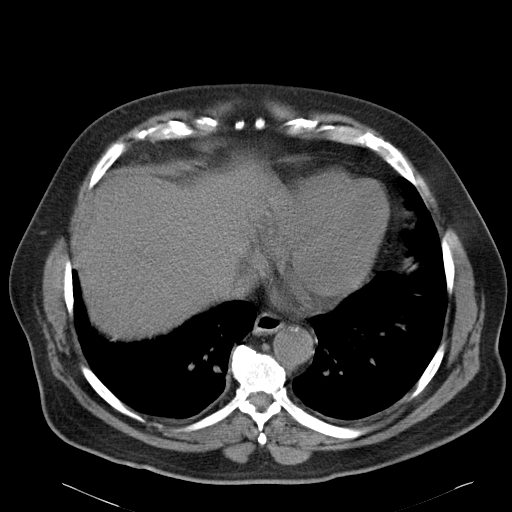
[im 91/105  lung]
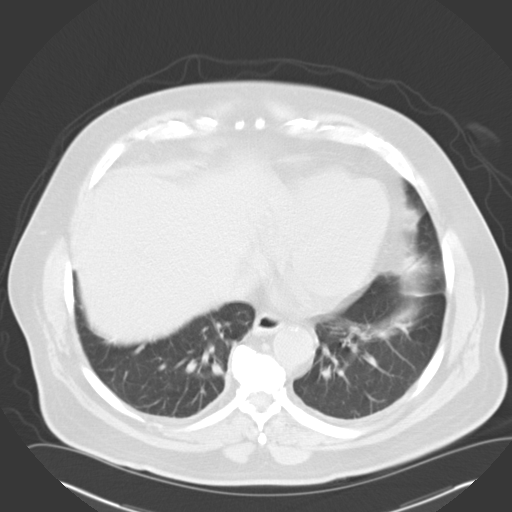
[im 95/105  lung]
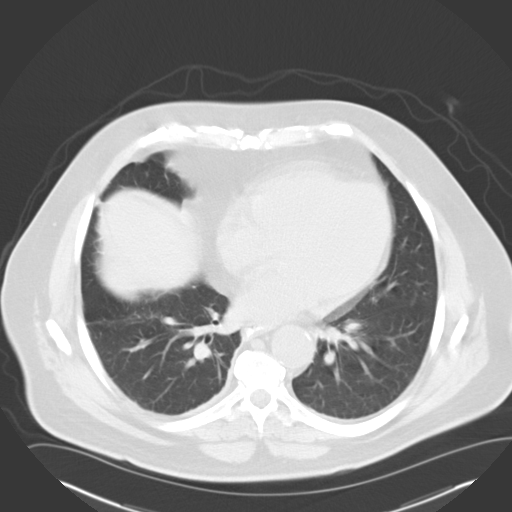
[im 100/105  soft-tissue]
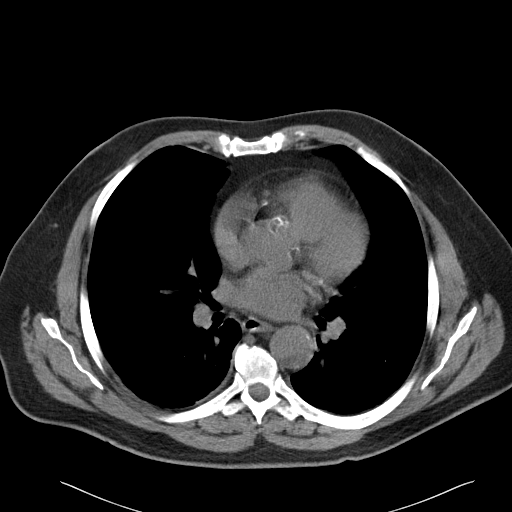
[im 100/105  lung]
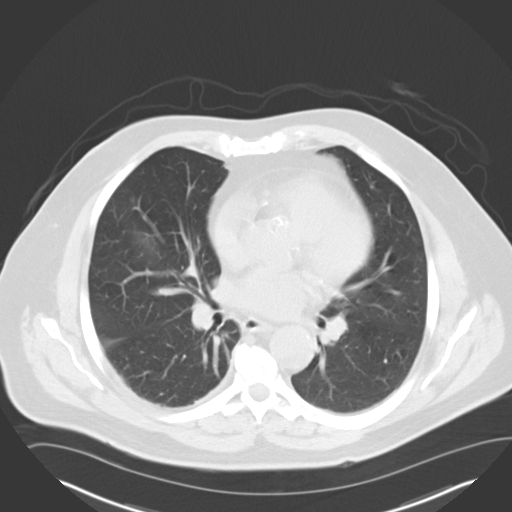

[14 of 32 positions shown; findings below may reference images not displayed]

FINDINGS: 4 mm nodule in the right middle lobe is unchanged (series 4, image
7). 3 mm right lower lobe subpleural nodule is also unchanged
(series 4, image 7). Subsegmental atelectasis is noted in both lung
bases. Three-vessel coronary artery calcification is noted.

Sequelae of interval cholecystectomy are identified with minimal
stranding/soft tissue thickening in the gallbladder fossa. No
gallbladder fossa fluid collection is seen. The liver, spleen, and
pancreas are unremarkable. Minimal bilateral adrenal gland
thickening is unchanged. Bilateral renal atrophy is again seen with
unchanged low-density renal lesions bilaterally compatible with
cysts. There is mild bilateral hydronephrosis. There is mild
bilateral perinephric stranding which extends inferiorly in the
retroperitoneum, greater on the left particularly adjacent to the
left ureter and increased from the prior study.

A small amount of contrast is noted in the distal esophagus,
possibly representing reflux. Oral contrast is present in multiple
nondilated loops of small bowel. There is no evidence of bowel
obstruction. There is also some residual oral contrast in the colon.
The appendix is unremarkable. There is prominent distention of the
bladder.

Moderate aortic atherosclerosis is noted. Mild postsurgical changes
are noted in the midline of the lower abdomen. The prostate is
mildly enlarged. No free fluid or enlarged lymph nodes are
identified. Thoracolumbar spondylosis is noted. There is grade 1
anterolisthesis of L5 on S1 which appears facet mediated.
IMPRESSION: 1. Mild bilateral hydronephrosis likely secondary to prominent
bladder distention. Mildly increased retroperitoneal stranding,
particularly adjacent to the inferior left kidney and about the left
ureter, nonspecific however upper tract infection is a
consideration.
2. Interval cholecystectomy.

## 2017-04-09 IMAGING — DX DG ABDOMEN 1V
2 series · 2 of 2 positions shown · non-contrast
Comparison: Abdominal CT 02/05/2015

CLINICAL DATA: Gallbladder surgery 1 week ago with urinary
retention.

EXAM:
ABDOMEN - 1 VIEW

[abdomen kub (1 of 2)]
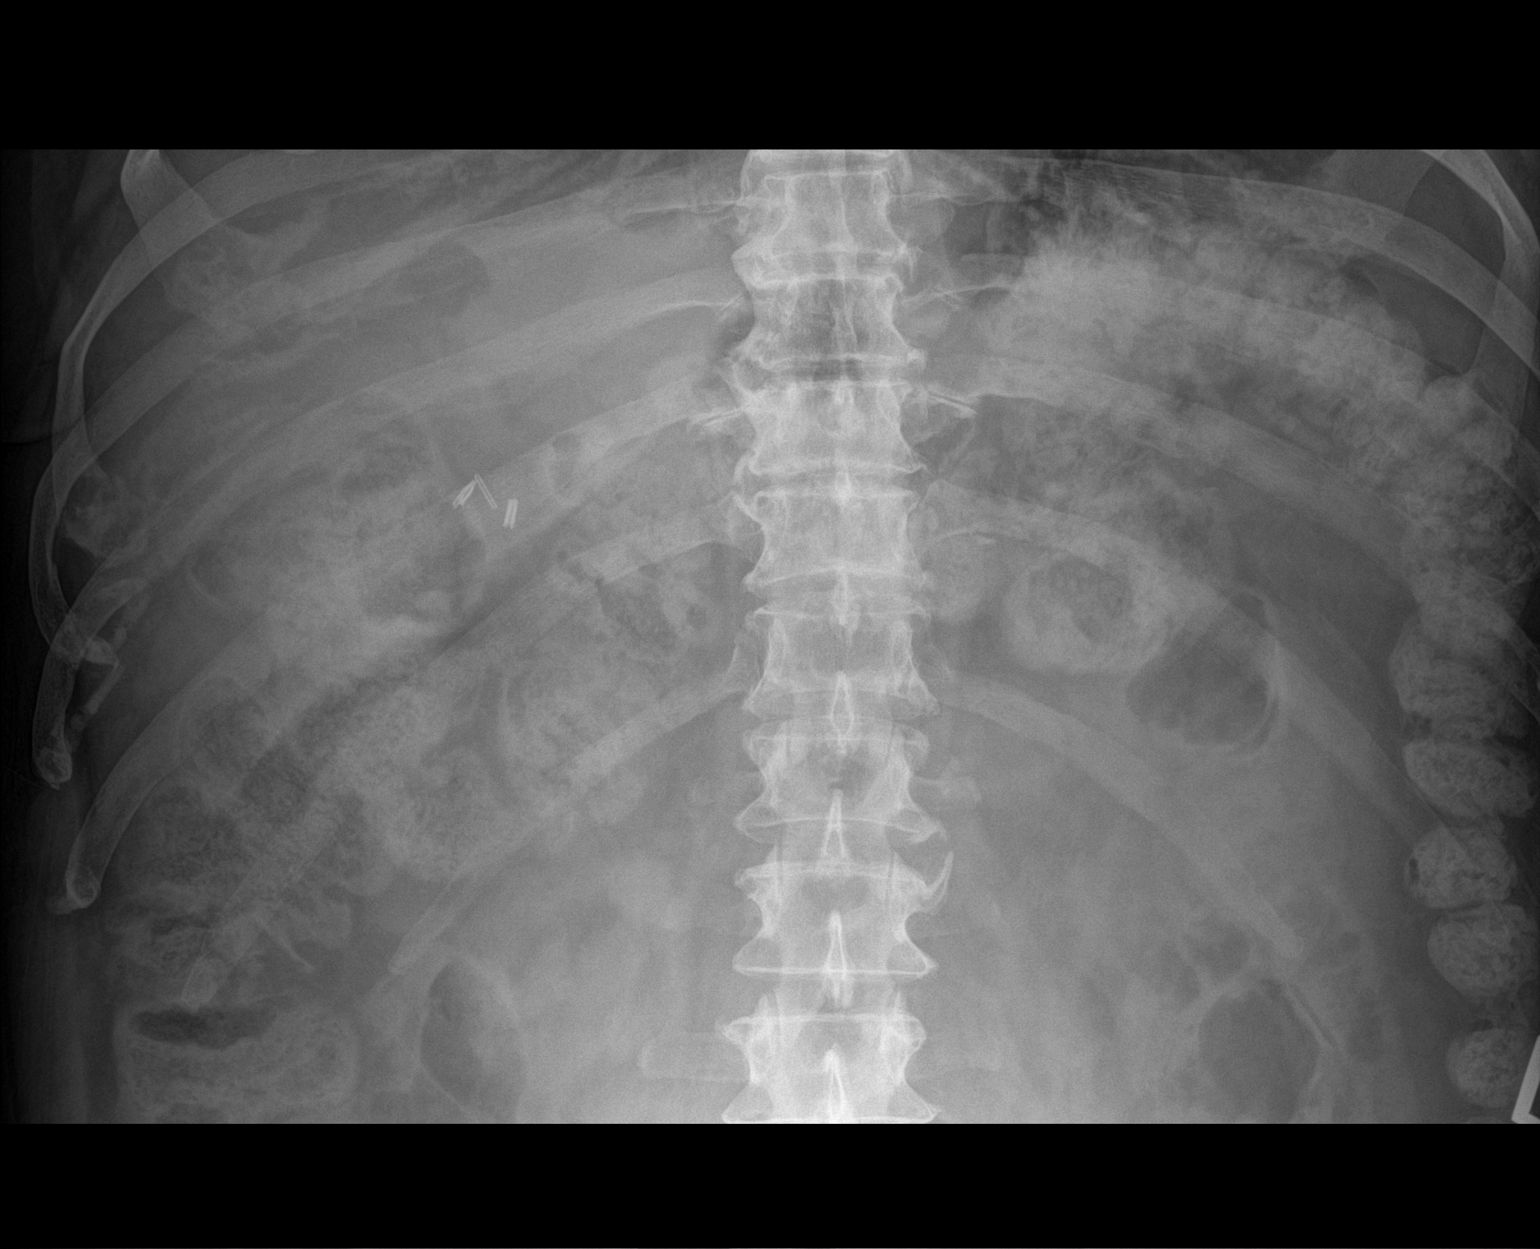

[abdomen kub (2 of 2)]
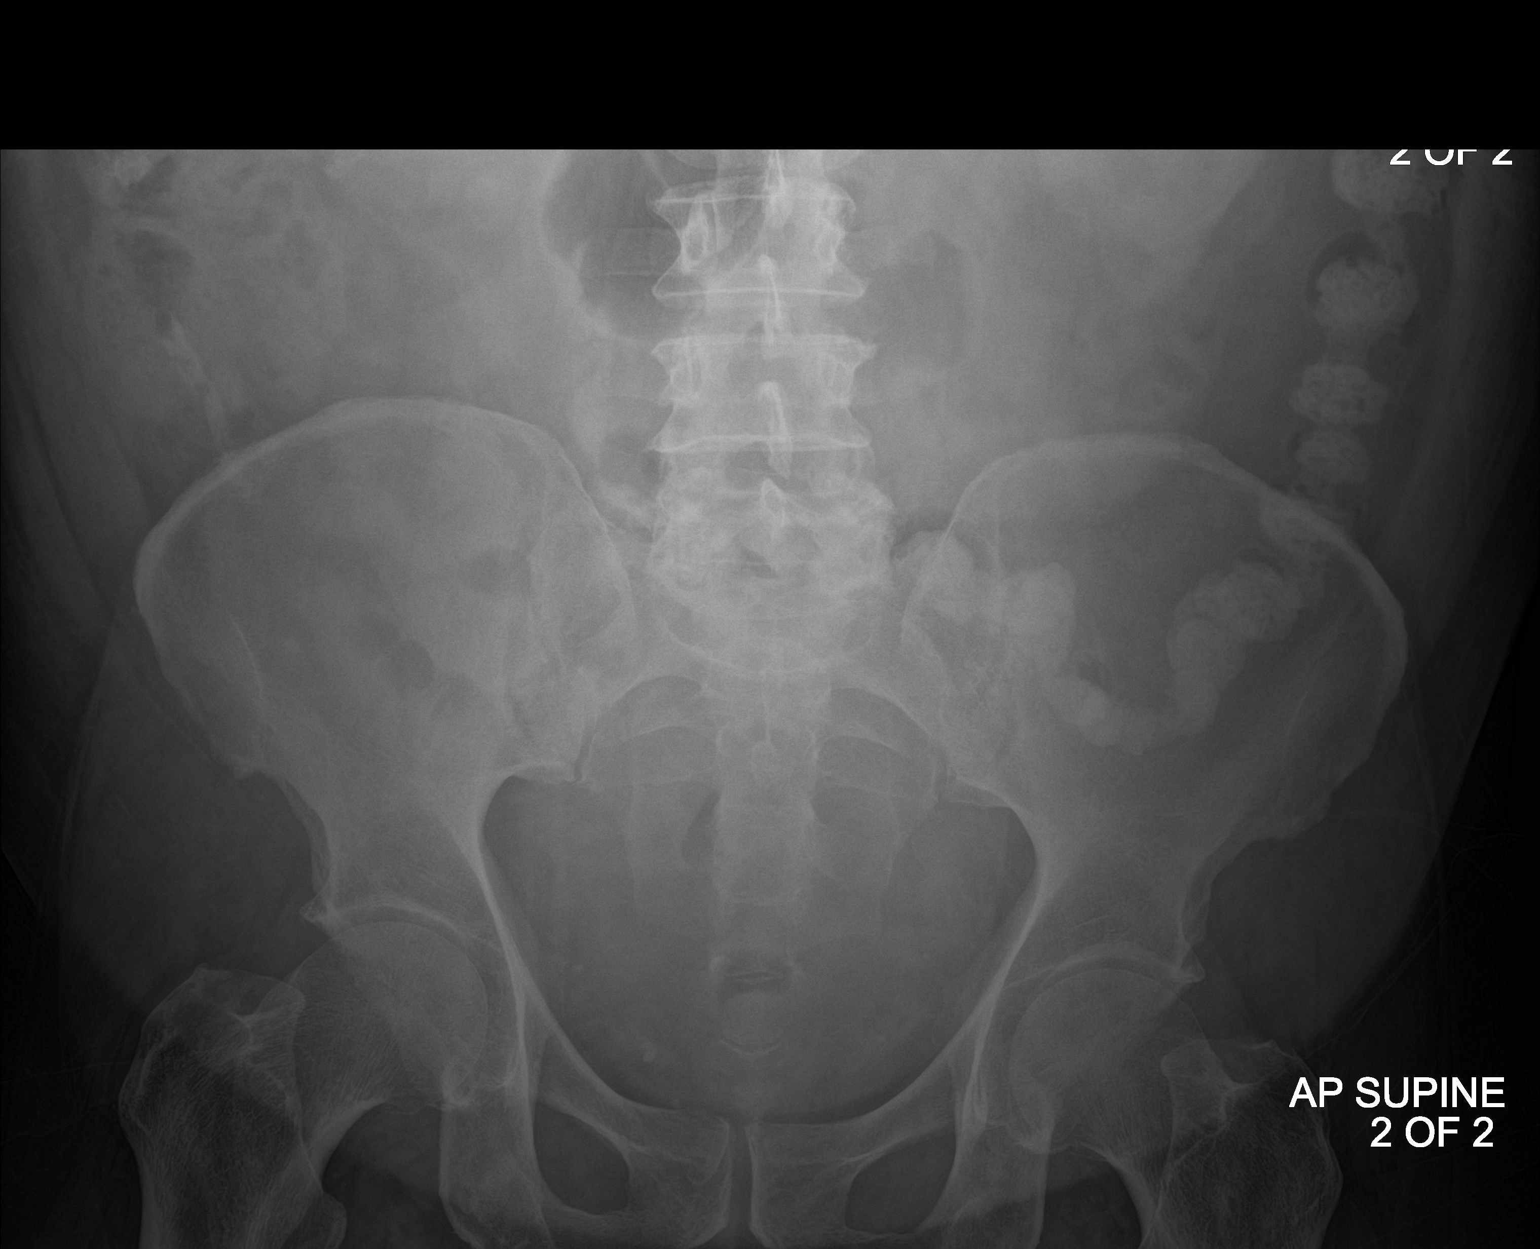

[2 of 2 positions shown; findings below may reference images not displayed]

FINDINGS: Normal bowel gas pattern. Previously administered oral contrast is
still visible in the nondilated colon. There are changes of
cholecystectomy, which was recent. No concerning intra-abdominal
mass effect or calcification. The bladder shadow is visible, bladder
volume may be better estimated by sonography.
IMPRESSION: 1. Nonobstructive bowel gas pattern.
2. Visible bladder shadow. Bladder volume would be better estimated
by sonography.

## 2019-04-10 ENCOUNTER — Other Ambulatory Visit: Payer: Self-pay

## 2019-04-10 ENCOUNTER — Emergency Department
Admission: EM | Admit: 2019-04-10 | Discharge: 2019-04-11 | Disposition: A | Discharge status: Home / Self Care | Payer: Medicare HMO | Attending: Emergency Medicine | Admitting: Emergency Medicine

## 2019-04-10 DIAGNOSIS — J1289 Other viral pneumonia: Secondary | ICD-10-CM | POA: Diagnosis not present

## 2019-04-10 DIAGNOSIS — N189 Chronic kidney disease, unspecified: Secondary | ICD-10-CM | POA: Diagnosis not present

## 2019-04-10 DIAGNOSIS — Z87891 Personal history of nicotine dependence: Secondary | ICD-10-CM | POA: Insufficient documentation

## 2019-04-10 DIAGNOSIS — Z79899 Other long term (current) drug therapy: Secondary | ICD-10-CM | POA: Insufficient documentation

## 2019-04-10 DIAGNOSIS — U071 COVID-19: Secondary | ICD-10-CM

## 2019-04-10 DIAGNOSIS — R0602 Shortness of breath: Secondary | ICD-10-CM | POA: Diagnosis present

## 2019-04-10 DIAGNOSIS — I129 Hypertensive chronic kidney disease with stage 1 through stage 4 chronic kidney disease, or unspecified chronic kidney disease: Secondary | ICD-10-CM | POA: Insufficient documentation

## 2019-04-10 LAB — BASIC METABOLIC PANEL
Anion gap: 11 (ref 5–15)
BUN: 20 mg/dL (ref 8–23)
CO2: 21 mmol/L — ABNORMAL LOW (ref 22–32)
Calcium: 8.3 mg/dL — ABNORMAL LOW (ref 8.9–10.3)
Chloride: 104 mmol/L (ref 98–111)
Creatinine, Ser: 1.58 mg/dL — ABNORMAL HIGH (ref 0.61–1.24)
GFR calc Af Amer: 47 mL/min — ABNORMAL LOW (ref >60)
GFR calc non Af Amer: 41 mL/min — ABNORMAL LOW (ref >60)
Glucose, Bld: 104 mg/dL — ABNORMAL HIGH (ref 70–99)
Potassium: 4.2 mmol/L (ref 3.5–5.1)
Sodium: 136 mmol/L (ref 135–145)

## 2019-04-10 LAB — CBC
HCT: 39.6 % (ref 39.0–52.0)
Hemoglobin: 13 g/dL (ref 13.0–17.0)
MCH: 26.9 pg (ref 26.0–34.0)
MCHC: 32.8 g/dL (ref 30.0–36.0)
MCV: 82 fL (ref 80.0–100.0)
Platelets: 223 10*3/uL (ref 150–400)
RBC: 4.83 MIL/uL (ref 4.22–5.81)
RDW: 14.1 % (ref 11.5–15.5)
WBC: 6.6 10*3/uL (ref 4.0–10.5)
nRBC: 0 % (ref 0.0–0.2)

## 2019-04-10 NOTE — ED Triage Notes (Addendum)
Fatigue, exertional SOB, nonproductive cough, fever and right ear pain X 10 days. Swabbed for COVID with no results yet per patient. No sick contacts. Pt alert and oriented X4, active, cooperative, pt in NAD. RR even and unlabored, color WNL.   Repeatedly denies chest pain.

## 2019-04-11 ENCOUNTER — Emergency Department: Payer: Medicare HMO

## 2019-04-11 LAB — SARS CORONAVIRUS 2 BY RT PCR (HOSPITAL ORDER, PERFORMED IN ~~LOC~~ HOSPITAL LAB): SARS Coronavirus 2: POSITIVE — AB

## 2019-04-11 LAB — TROPONIN I (HIGH SENSITIVITY): Troponin I (High Sensitivity): 12 ng/L (ref <18)

## 2019-04-11 MED ORDER — SODIUM CHLORIDE 0.9 % IV SOLN
1.0000 g | Freq: Once | INTRAVENOUS | Status: AC
  Administered 2019-04-11: 1 g via INTRAVENOUS
  Filled 2019-04-11: qty 10

## 2019-04-11 MED ORDER — ONDANSETRON 4 MG PO TBDP: 4.0000 mg | ORAL_TABLET | Freq: Three times a day (TID) | ORAL | 0 refills | Status: DC | PRN

## 2019-04-11 MED ORDER — ACETAMINOPHEN 500 MG PO TABS
1000.0000 mg | ORAL_TABLET | Freq: Once | ORAL | Status: AC
  Administered 2019-04-11: 1000 mg via ORAL

## 2019-04-11 MED ORDER — ONDANSETRON HCL 4 MG/2ML IJ SOLN
4.0000 mg | Freq: Once | INTRAMUSCULAR | Status: AC
  Administered 2019-04-11: 4 mg via INTRAVENOUS
  Filled 2019-04-11: qty 2

## 2019-04-11 MED ORDER — SODIUM CHLORIDE 0.9 % IV SOLN
500.0000 mg | Freq: Once | INTRAVENOUS | Status: AC
  Administered 2019-04-11: 500 mg via INTRAVENOUS
  Filled 2019-04-11: qty 500

## 2019-04-11 MED ORDER — ACETAMINOPHEN 500 MG PO TABS
ORAL_TABLET | ORAL | Status: AC
  Filled 2019-04-11: qty 2

## 2019-04-11 NOTE — ED Notes (Signed)
sats to 91% while ambulating

## 2019-04-11 NOTE — ED Provider Notes (Signed)
[facility] Emergency Department Provider Note  ____________________________________________  Time seen: Approximately 12:46 AM  I have reviewed the triage vital signs and the nursing notes.   HISTORY  Chief Complaint Shortness of Breath, Fever, Fatigue, and Otalgia   HPI Corey Johnson is a 78 y.o. male with a history of chronic kidney disease, hypertension, hyperlipidemia, sleep apnea on CPAP who presents for evaluation of COVID-like symptoms.  Patient has had symptoms for 3 weeks.  Will swab for COVID a week ago but still has not received his results.  Symptoms are cough, shortness of breath, body aches, headache, ear pain, sore throat, fever, nausea, chills, diarrhea, and generalized weakness.  Patient presents today for worsening shortness of breath and weakness.  Daughter reports that patient today required help to be able to ambulate safely.  He lives at home by himself.  Patient has a pulse oximeter at home and checked his sats today during a coughing fit and found that it was 88%. Hypoxia resolved once his daughter placed him on his CPAP. He is complaining of mild chest pain that he describes as soreness mostly when he coughs.  No personal family history of blood clots, no recent travel or immobilization, no leg pain or swelling, no hemoptysis.   Past Medical History:  Diagnosis Date   Arthritis    BPH (benign prostatic hyperplasia)    CRF (chronic renal failure)    Gout    H. pylori infection    Hyperlipemia    Hypertension    Psoriasis    Renal disorder    Renal insufficiency    Sleep apnea    CPAP   Vitamin D deficiency     Patient Active Problem List   Diagnosis Date Noted   Personal history of colonic polyps    Rectal polyp    Benign neoplasm of transverse colon    Clot hematuria 04/27/2015   Sepsis ([place]) 04/22/2015   UTI (lower urinary tract infection) 04/22/2015   Hematuria 04/22/2015   Gout 02/13/2015   ARF  (acute renal failure) ([place]) 02/13/2015   Abdominal distension 02/13/2015   HTN (hypertension) 02/13/2015   Psoriasis 02/13/2015   BPH (benign prostatic hyperplasia) 02/13/2015   RLS (restless legs syndrome) 02/13/2015   Allergic rhinitis 02/13/2015   GERD (gastroesophageal reflux disease) 02/13/2015   Sleep apnea 02/13/2015   CRI (chronic renal insufficiency) 02/13/2015   Acute renal failure ([place]) 02/13/2015    Past Surgical History:  Procedure Laterality Date   CHOLECYSTECTOMY N/A 02/06/2015   Procedure: LAPAROSCOPIC CHOLECYSTECTOMY WITH INTRAOPERATIVE CHOLANGIOGRAM;  Surgeon: Dia Crawford III, MD;  Location: [facility] ORS;  Service: General;  Laterality: N/A;   COLONOSCOPY WITH PROPOFOL N/A 06/13/2015   Procedure: COLONOSCOPY WITH PROPOFOL;  Surgeon: Lucilla Lame, MD;  Location: [facility] ENDOSCOPY;  Service: Endoscopy;  Laterality: N/A;   CYSTOSCOPY N/A 04/27/2015   Procedure: Albertine Grates, CLOT EVACUATION ;  Surgeon: Royston Cowper, MD;  Location: [facility] ORS;  Service: Urology;  Laterality: N/A;   GREEN LIGHT LASER TURP (TRANSURETHRAL RESECTION OF PROSTATE N/A 03/28/2015   Procedure: GREEN LIGHT LASER TURP (TRANSURETHRAL RESECTION OF PROSTATE;  Surgeon: Royston Cowper, MD;  Location: [facility] ORS;  Service: Urology;  Laterality: N/A;   JOINT REPLACEMENT     left knee   KNEE ARTHROSCOPY     REPLACEMENT TOTAL KNEE     SHOULDER SURGERY Right    TRANSURETHRAL RESECTION OF PROSTATE      Prior to Admission medications   Medication Sig  Start Date End Date Taking? Authorizing Provider  acetaminophen (TYLENOL) 500 MG tablet Take 1,000 mg by mouth every 6 (six) hours as needed for mild pain or moderate pain.    [provider]  allopurinol (ZYLOPRIM) 100 MG tablet Take 100 mg by mouth daily. 04/11/15   [provider]  alum & mag hydroxide-simeth (GELUSIL) 200-200-25 MG suspension Chew 1 tablet by mouth 3 (three) times daily as needed for indigestion, heartburn or  flatulence. 04/24/15   Dustin Flock, MD  bisacodyl (DULCOLAX) 10 MG suppository Place 1 suppository (10 mg total) rectally daily as needed for moderate constipation. 04/28/15   Royston Cowper, MD  Cholecalciferol 1000 UNITS tablet Take 2,000 Units by mouth daily.     [provider]  clotrimazole-betamethasone (LOTRISONE) cream Apply topically 2 (two) times daily. 04/28/15   Royston Cowper, MD  colchicine 0.6 MG tablet Take 0.6 mg by mouth daily as needed. For gout flare ups.    [provider]  docusate sodium (COLACE) 100 MG capsule Take 1 capsule (100 mg total) by mouth 2 (two) times daily. 02/17/15   Dustin Flock, MD  docusate sodium (COLACE) 100 MG capsule Take 2 capsules (200 mg total) by mouth 2 (two) times daily. 04/28/15   Royston Cowper, MD  dutasteride (AVODART) 0.5 MG capsule Take 0.5 mg by mouth daily.    [provider]  enalapril (VASOTEC) 20 MG tablet Take 20 mg by mouth daily.    [provider]  hydrochlorothiazide (HYDRODIURIL) 25 MG tablet Take 25 mg by mouth daily.    [provider]  hyoscyamine (LEVSIN SL) 0.125 MG SL tablet Place 1 tablet (0.125 mg total) under the tongue every 4 (four) hours as needed (bladder spasm). 04/28/15   Royston Cowper, MD  levofloxacin (LEVAQUIN) 500 MG tablet Take 1 tablet by mouth daily. 04/25/15   [provider]  levothyroxine (SYNTHROID, LEVOTHROID) 50 MCG tablet Take 1 tablet (50 mcg total) by mouth daily before breakfast. 04/24/15 04/29/15  Dustin Flock, MD  Meth-Hyo-M Barnett Hatter Phos-Ph Sal (URIBEL) 118 MG CAPS Take 1 capsule (118 mg total) by mouth every 6 (six) hours as needed. 03/28/15   Royston Cowper, MD  NUCYNTA 50 MG TABS tablet Take 1 tablet (50 mg total) by mouth every 6 (six) hours as needed. 1 TO 2 TABS Q 6 HOURS PRN PAIN 03/28/15   Royston Cowper, MD  omeprazole (PRILOSEC) 20 MG capsule Take 20 mg by mouth daily as needed (for heartburn/indigestion).     [provider]  ondansetron (ZOFRAN ODT) 4 MG disintegrating tablet Take 1 tablet (4 mg total) by mouth every 8 (eight) hours as needed. 04/11/19   Rudene Re, MD  pravastatin (PRAVACHOL) 10 MG tablet Take 10 mg by mouth at bedtime.     [provider]  rOPINIRole (REQUIP) 1 MG tablet Take 2 mg by mouth every evening.     [provider]  tamsulosin (FLOMAX) 0.4 MG CAPS capsule Take 0.4 mg by mouth daily.    [provider]    Allergies Lisinopril, Metoprolol, Tapentadol, Tramadol, Nsaids, and Simvastatin  Family History  Problem Relation Age of Onset   Hypertension Mother    Pneumonia Father     Social History Social History   Tobacco Use   Smoking status: Former Smoker    Years: 20.00    Types: Cigarettes   Smokeless tobacco: Never Used  Substance Use Topics   Alcohol use: No  Alcohol/week: 0.0 standard drinks   Drug use: No    Review of Systems  Constitutional: + fever, chills, fatigue, generalized weaknesss Eyes: Negative for visual changes. ENT: + sore throat. Neck: No neck pain  Cardiovascular: + chest pain. Respiratory: + shortness of breath, cough Gastrointestinal: Negative for abdominal pain. + nausea and diarrhea. Genitourinary: Negative for dysuria. Musculoskeletal: Negative for back pain. Skin: Negative for rash. Neurological: Negative for headaches, weakness or numbness. Psych: No SI or HI  ____________________________________________   PHYSICAL EXAM:  VITAL SIGNS: ED Triage Vitals [04/10/19 1742]  Enc Vitals Group     BP 138/67     Pulse Rate 77     Resp 20     Temp 99.4 F (37.4 C)     Temp Source Oral     SpO2 92 %     Weight 245 lb (111.1 kg)     Height 5\' 9"  (1.753 m)     Head Circumference      Peak Flow      Pain Score 5     Pain Loc      Pain Edu?      Excl. in [place]?     Constitutional: Alert and oriented. Well appearing and in no apparent distress. HEENT:      Head: Normocephalic and  atraumatic.         Eyes: Conjunctivae are normal. Sclera is non-icteric.       Mouth/Throat: Mucous membranes are moist.       Neck: Supple with no signs of meningismus. Cardiovascular: Regular rate and rhythm. No murmurs, gallops, or rubs. 2+ symmetrical distal pulses are present in all extremities. No JVD. Respiratory: Normal respiratory effort. Lungs are clear to auscultation bilaterally with diffuse crackles bilaterally. Gastrointestinal: Soft, non tender, and non distended with positive bowel sounds. No rebound or guarding. Musculoskeletal: Nontender with normal range of motion in all extremities. No edema, cyanosis, or erythema of extremities. Neurologic: Normal speech and language. Face is symmetric. Moving all extremities. No gross focal neurologic deficits are appreciated. Skin: Skin is warm, dry and intact. No rash noted. Psychiatric: Mood and affect are normal. Speech and behavior are normal.  ____________________________________________   LABS (all labs ordered are listed, but only abnormal results are displayed)  Labs Reviewed  SARS CORONAVIRUS 2 (HOSPITAL ORDER, [place] [facility]) - Abnormal; Notable for the following components:      Result Value   SARS Coronavirus 2 POSITIVE (*)    All other components within normal limits  BASIC METABOLIC PANEL - Abnormal; Notable for the following components:   CO2 21 (*)    Glucose, Bld 104 (*)    Creatinine, Ser 1.58 (*)    Calcium 8.3 (*)    GFR calc non Af Amer 41 (*)    GFR calc Af Amer 47 (*)    All other components within normal limits  CBC  TROPONIN I (HIGH SENSITIVITY)   ____________________________________________  EKG  ED ECG REPORT I, Rudene Re, the attending physician, personally viewed and interpreted this ECG.  Normal sinus rhythm with occasional PVCs, rate of 76, normal intervals, normal axis, no ST elevations or  depressions ____________________________________________  RADIOLOGY  I have personally reviewed the images performed during this visit and I agree with the Radiologist's read.   Interpretation by Radiologist:  Dg Chest Portable 1 View  Result Date: 04/11/2019 CLINICAL DATA:  Initial evaluation for COVID like symptoms. EXAM: PORTABLE CHEST 1 VIEW COMPARISON:  Prior radiograph from  04/22/2015 FINDINGS: Cardiac and mediastinal silhouettes are stable in size and contour, and remain within normal limits. Aortic atherosclerosis. Lungs mildly hypoinflated. Mild streaky bibasilar subsegmental atelectasis. Mild perihilar vascular congestion without pulmonary edema. There are a few scattered patchy multifocal opacities involving the peripheral lungs bilaterally, right greater than left, which could reflect acute infectious pneumonitis. No pleural effusion. No pneumothorax. No acute osseous finding. IMPRESSION: 1. Scattered multifocal patchy opacities involving the peripheral right greater than left lungs, suspicious for acute infectious pneumonitis. 2. Low lung volumes with superimposed mild bibasilar subsegmental atelectasis. 3. Aortic atherosclerosis. Electronically Signed   By: Jeannine Boga M.D.   On: 04/11/2019 00:56      ____________________________________________   PROCEDURES  Procedure(s) performed: None Procedures Critical Care performed: yes  CRITICAL CARE Performed by: Rudene Re  ?  Total critical care time: 35 min  Critical care time was exclusive of separately billable procedures and treating other patients.  Critical care was necessary to treat or prevent imminent or life-threatening deterioration.  Critical care was time spent personally by me on the following activities: development of treatment plan with patient and/or surrogate as well as nursing, discussions with consultants, evaluation of patient's response to treatment, examination of patient, obtaining  history from patient or surrogate, ordering and performing treatments and interventions, ordering and review of laboratory studies, ordering and review of radiographic studies, pulse oximetry and re-evaluation of patient's condition.  ____________________________________________   INITIAL IMPRESSION / ASSESSMENT AND PLAN / ED COURSE  78 y.o. male with a history of chronic kidney disease, hypertension, hyperlipidemia, sleep apnea on CPAP who presents for evaluation of COVID-like symptoms x 3 weeks now with worsening SOB, hypoxic at room, and generalized weakness/ difficulty ambulating independently.  Patient normal work of breathing and sating 95% at rest, with ambulation sats dropped to 91% but recovered as soon as patient sat down. Lung auscultation reveals diffuse crackles bilaterally.  Differential diagnoses including COVID-19 versus community-acquired pneumonia.  Patient with normal white count.  EKG and troponin with no evidence of myocarditis.  Chest x-ray showing scattered multifocal pneumonia. Will cover with rocephin and azithromycin.     _________________________ 2:50 AM on 04/11/2019 ----------------------------------------- COVID positive. Recommended admission for stas of 91% with ambulation but patient refused. He is accompanied by his daughter who is a Chief Executive Officer at [employer]. They both understand the risks associated with leaving AMA including worsening respiratory status, hypoxia, respiratory arrest, cardiac arrest.  Patient reports that he suffers from severe anxiety and does not like to stay in the hospital.  He understands the risks and so does his daughter.  Daughter does have a pulse oximeter and I recommended checking his pulse ox at home 3 times a day both at rest and with ambulation and returning to the hospital for chest pain, worsening shortness of breath, or sats 92% or lower.  They do understand the strict recommendations.  I will discharge him home to the care of his  daughter.   _________________________ 3:26 AM on 04/11/2019 -----------------------------------------  Attempted one more time to convince patient and daughter that the best thing for him would be to admit him for observation.  They are both adamant about going home.  Discussed very strict return precautions.   As part of my medical decision making, I reviewed the following data within the [facility] History obtained from family, Nursing notes reviewed and incorporated, Labs reviewed , EKG interpreted , Old EKG reviewed, Old chart reviewed, Radiograph reviewed , Notes from prior ED  visits and [place] Controlled Substance Database   Patient was evaluated in Emergency Department today for the symptoms described in the history of present illness. Patient was evaluated in the context of the global COVID-19 pandemic, which necessitated consideration that the patient might be at risk for infection with the SARS-CoV-2 virus that causes COVID-19. Institutional protocols and algorithms that pertain to the evaluation of patients at risk for COVID-19 are in a state of rapid change based on information released by regulatory bodies including the CDC and federal and state organizations. These policies and algorithms were followed during the patient's care in the ED.   ____________________________________________   FINAL CLINICAL IMPRESSION(S) / ED DIAGNOSES   Final diagnoses:  Pneumonia due to COVID-19 virus      NEW MEDICATIONS STARTED DURING THIS VISIT:  ED Discharge Orders         Ordered    ondansetron (ZOFRAN ODT) 4 MG disintegrating tablet  Every 8 hours PRN     04/11/19 0310           Note:  This document was prepared using Dragon voice recognition software and may include unintentional dictation errors.    Alfred Levins, [place], MD 04/11/19 [phone]

## 2019-04-11 NOTE — Discharge Instructions (Signed)
QUARANTINE INSTRUCTION  Follow these instructions at home:  Protecting others To avoid spreading the illness to other people: Quarantine in your home until you have had no cough and fever for 7 days. Household members should also be quarantine for at least 14 days after being exposed to you. Wash your hands often with soap and water. If soap and water are not available, use an alcohol-based hand sanitizer. If you have not cleaned your hands, do not touch your face. Make sure that all people in your household wash their hands well and often. Cover your nose and mouth when you cough or sneeze. Throw away used tissues. Stay home if you have any cold-like or flu-like symptoms. General instructions Go to your local pharmacy and buy a pulse oximeter (this is a machine that measures your oxygen). Check your oxygen levels at least 3 times a day. If your oxygen level is 92% or less return to the emergency room immediately Take over-the-counter and prescription medicines only as told by your health care provider. If you need medication for fever take tylenol or ibuprofen Drink enough fluid to keep your urine pale yellow. Rest at home as directed by your health care provider. Do not give aspirin to a child with the flu, because of the association with Reye's syndrome. Do not use tobacco products, including cigarettes, chewing tobacco, and e-cigarettes. If you need help quitting, ask your health care provider. Keep all follow-up visits as told by your health care provider. This is important. How is this prevented? Avoid areas where an outbreak has been reported. Avoid large groups of people. Keep a safe distance from people who are coughing and sneezing. Do not touch your face if you have not cleaned your hands. When you are around people who are sick or might be sick, wear a mask to protect yourself. Contact a health care provider if: You have symptoms of SARS (cough, fever, chest pain, shortness of  breath) that are not getting better at home. You have a fever. If you have difficulty breathing go to your local ER or call 911

## 2020-06-19 ENCOUNTER — Ambulatory Visit: Payer: Medicare HMO | Attending: Otolaryngology

## 2020-06-19 DIAGNOSIS — G4733 Obstructive sleep apnea (adult) (pediatric): Secondary | ICD-10-CM | POA: Diagnosis present

## 2020-06-19 DIAGNOSIS — I10 Essential (primary) hypertension: Secondary | ICD-10-CM | POA: Insufficient documentation

## 2020-06-21 ENCOUNTER — Other Ambulatory Visit: Payer: Self-pay

## 2021-04-06 ENCOUNTER — Other Ambulatory Visit: Payer: Self-pay | Admitting: Surgery

## 2021-04-26 ENCOUNTER — Other Ambulatory Visit: Payer: Medicare HMO

## 2021-05-01 ENCOUNTER — Other Ambulatory Visit: Admission: RE | Admit: 2021-05-01 | Payer: Medicare HMO | Source: Ambulatory Visit

## 2021-05-03 ENCOUNTER — Inpatient Hospital Stay: Admit: 2021-05-03 | Payer: Medicare HMO | Admitting: Surgery

## 2021-05-03 SURGERY — ARTHROPLASTY, KNEE, TOTAL
Anesthesia: Choice | Site: Knee | Laterality: Right

## 2021-12-24 ENCOUNTER — Ambulatory Visit
Admission: EM | Admit: 2021-12-24 | Discharge: 2021-12-24 | Disposition: A | Discharge status: Home / Self Care | Payer: Medicare PPO

## 2022-02-11 ENCOUNTER — Ambulatory Visit: Payer: Self-pay

## 2022-02-11 ENCOUNTER — Ambulatory Visit
Admission: EM | Admit: 2022-02-11 | Discharge: 2022-02-11 | Disposition: A | Discharge status: Home / Self Care | Payer: Medicare PPO

## 2022-02-11 ENCOUNTER — Other Ambulatory Visit: Payer: Self-pay

## 2022-02-11 DIAGNOSIS — H73011 Bullous myringitis, right ear: Secondary | ICD-10-CM | POA: Diagnosis not present

## 2022-02-11 DIAGNOSIS — H6093 Unspecified otitis externa, bilateral: Secondary | ICD-10-CM | POA: Diagnosis not present

## 2022-02-11 MED ORDER — AZITHROMYCIN 250 MG PO TABS: ORAL_TABLET | ORAL | 0 refills | Status: DC

## 2022-02-11 MED ORDER — NEOMYCIN-POLYMYXIN-HC 3.5-10000-1 OT SUSP: 4.0000 [drp] | Freq: Three times a day (TID) | OTIC | 0 refills | Status: DC

## 2022-02-11 NOTE — Discharge Instructions (Signed)
Avoid water into your ears til you have healed. You have ear canal and R ear drum infections.  I cant see the L ear drum due to the swelling of the ear canal.  The Zpack is to cover for the ear drum infection. While on it stop the Colcricine

## 2022-02-11 NOTE — ED Provider Notes (Signed)
[facility]    CSN: [id number] Arrival date & time: 02/11/22  1252      History   Chief Complaint Chief Complaint  Patient presents with   Otalgia    HPI Corey Johnson is a 81 y.o. male who presents with bilateral ear pain, and swelling on the L and cant get his hearing aids in. Started with itching in both ears one week ago. His daughter gave him some type of drops which he does not know what it is and has been using them.     Past Medical History:  Diagnosis Date   Arthritis    BPH (benign prostatic hyperplasia)    CRF (chronic renal failure)    Gout    H. pylori infection    Hyperlipemia    Hypertension    Psoriasis    Renal disorder    Renal insufficiency    Sleep apnea    CPAP   Vitamin D deficiency     Patient Active Problem List   Diagnosis Date Noted   Personal history of colonic polyps    Rectal polyp    Benign neoplasm of transverse colon    Clot hematuria 04/27/2015   Sepsis ([place]) 04/22/2015   UTI (lower urinary tract infection) 04/22/2015   Hematuria 04/22/2015   Gout 02/13/2015   ARF (acute renal failure) ([place]) 02/13/2015   Abdominal distension 02/13/2015   HTN (hypertension) 02/13/2015   Psoriasis 02/13/2015   BPH (benign prostatic hyperplasia) 02/13/2015   RLS (restless legs syndrome) 02/13/2015   Allergic rhinitis 02/13/2015   GERD (gastroesophageal reflux disease) 02/13/2015   Sleep apnea 02/13/2015   CRI (chronic renal insufficiency) 02/13/2015   Acute renal failure ([place]) 02/13/2015    Past Surgical History:  Procedure Laterality Date   CHOLECYSTECTOMY N/A 02/06/2015   Procedure: LAPAROSCOPIC CHOLECYSTECTOMY WITH INTRAOPERATIVE CHOLANGIOGRAM;  Surgeon: Dia Crawford III, MD;  Location: [facility] ORS;  Service: General;  Laterality: N/A;   COLONOSCOPY WITH PROPOFOL N/A 06/13/2015   Procedure: COLONOSCOPY WITH PROPOFOL;  Surgeon: Lucilla Lame, MD;  Location: [facility] END[facility]COPY;  Service: Endoscopy;  Laterality: N/A;   CYSTOSCOPY N/A  04/27/2015   Procedure: Albertine Grates, CLOT EVACUATION ;  Surgeon: Royston Cowper, MD;  Location: [facility] ORS;  Service: Urology;  Laterality: N/A;   GREEN LIGHT LASER TURP (TRANSURETHRAL RESECTION OF PROSTATE N/A 03/28/2015   Procedure: GREEN LIGHT LASER TURP (TRANSURETHRAL RESECTION OF PROSTATE;  Surgeon: Royston Cowper, MD;  Location: [facility] ORS;  Service: Urology;  Laterality: N/A;   JOINT REPLACEMENT     left knee   KNEE ARTHROSCOPY     REPLACEMENT TOTAL KNEE     SHOULDER SURGERY Right    TRANSURETHRAL RESECTION OF PROSTATE         Home Medications    Prior to Admission medications   Medication Sig Start Date End Date Taking? Authorizing Provider  allopurinol (ZYLOPRIM) 300 MG tablet Take by mouth. 02/09/21  Yes [provider]  azithromycin (ZITHROMAX Z-PAK) 250 MG tablet 2 today, then one qd x 4 days 02/11/22  Yes Rodriguez-Southworth, Sunday Spillers, PA-C  neomycin-polymyxin-hydrocortisone (CORTISPORIN) 3.5-10000-1 OTIC suspension Place 4 drops into both ears 3 (three) times daily. For 7 days 02/11/22  Yes Rodriguez-Southworth, Sunday Spillers, PA-C  acetaminophen (TYLENOL) 500 MG tablet Take 1,000 mg by mouth every 6 (six) hours as needed for mild pain or moderate pain.    [provider]  alfuzosin (UROXATRAL) 10 MG 24 hr tablet Take 10 mg by mouth daily. 01/23/22  [provider]  allopurinol (ZYLOPRIM) 100 MG tablet Take 100 mg by mouth daily. 04/11/15   [provider]  alum & mag hydroxide-simeth (GELUSIL) 200-200-25 MG suspension Chew 1 tablet by mouth 3 (three) times daily as needed for indigestion, heartburn or flatulence. 04/24/15   Dustin Flock, MD  amLODipine (NORVASC) 5 MG tablet Take by mouth.    [provider]  bisacodyl (DULCOLAX) 10 MG suppository Place 1 suppository (10 mg total) rectally daily as needed for moderate constipation. 04/28/15   Royston Cowper, MD  Cholecalciferol 1000 UNITS tablet Take 2,000 Units by mouth daily.      [provider]  clotrimazole-betamethasone (LOTRISONE) cream Apply topically 2 (two) times daily. 04/28/15   Royston Cowper, MD  colchicine 0.6 MG tablet Take 0.6 mg by mouth daily as needed. For gout flare ups.    [provider]  dutasteride (AVODART) 0.5 MG capsule Take 0.5 mg by mouth daily.    [provider]  enalapril (VASOTEC) 20 MG tablet Take 20 mg by mouth daily.    [provider]  hydrochlorothiazide (HYDRODIURIL) 25 MG tablet Take 25 mg by mouth daily.    [provider]  hyoscyamine (LEVSIN SL) 0.125 MG SL tablet Place 1 tablet (0.125 mg total) under the tongue every 4 (four) hours as needed (bladder spasm). 04/28/15   Royston Cowper, MD  levofloxacin (LEVAQUIN) 500 MG tablet Take 1 tablet by mouth daily. 04/25/15   [provider]  levothyroxine (SYNTHROID, LEVOTHROID) 50 MCG tablet Take 1 tablet (50 mcg total) by mouth daily before breakfast. 04/24/15 04/29/15  Dustin Flock, MD  Meth-Hyo-M Barnett Hatter Phos-Ph Sal (URIBEL) 118 MG CAPS Take 1 capsule (118 mg total) by mouth every 6 (six) hours as needed. 03/28/15   Royston Cowper, MD  NUCYNTA 50 MG TABS tablet Take 1 tablet (50 mg total) by mouth every 6 (six) hours as needed. 1 TO 2 TABS Q 6 HOURS PRN PAIN 03/28/15   Royston Cowper, MD  omeprazole (PRILOSEC) 20 MG capsule Take 20 mg by mouth daily as needed (for heartburn/indigestion).     [provider]  ondansetron (ZOFRAN ODT) 4 MG disintegrating tablet Take 1 tablet (4 mg total) by mouth every 8 (eight) hours as needed. 04/11/19   Rudene Re, MD  pravastatin (PRAVACHOL) 10 MG tablet Take 10 mg by mouth at bedtime.     [provider]  rOPINIRole (REQUIP) 1 MG tablet Take 2 mg by mouth every evening.     [provider]  tamsulosin (FLOMAX) 0.4 MG CAPS capsule Take 0.4 mg by mouth daily.    [provider]    Family History Family History  Problem Relation Age of Onset    Hypertension Mother    Pneumonia Father     Social History Social History   Tobacco Use   Smoking status: Former    Years: 20.00    Types: Cigarettes   Smokeless tobacco: Never  Substance Use Topics   Alcohol use: No    Alcohol/week: 0.0 standard drinks   Drug use: No     Allergies   Lisinopril, Metoprolol, Tapentadol, Tramadol, Nsaids, and Simvastatin   Review of Systems Review of Systems  Constitutional:  Negative for fever.  HENT:  Positive for ear pain, rhinorrhea and sneezing. Negative for congestion and ear discharge.     Physical Exam Triage Vital Signs ED Triage Vitals  Enc Vitals Group     BP 02/11/22 1331 106/62  Pulse Rate 02/11/22 1331 75     Resp 02/11/22 1331 17     Temp 02/11/22 1331 98.4 F (36.9 C)     Temp Source 02/11/22 1331 Oral     SpO2 02/11/22 1331 96 %     Weight 02/11/22 1326 245 lb (111.1 kg)     Height 02/11/22 1326 '5\' 9"'$  (1.753 m)     Head Circumference --      Peak Flow --      Pain Score 02/11/22 1326 6     Pain Loc --      Pain Edu? --      Excl. in [place]? --    No data found.  Updated Vital Signs BP 106/62 (BP Location: Right Arm)   Pulse 75   Temp 98.4 F (36.9 C) (Oral)   Resp 17   Ht '5\' 9"'$  (1.753 m)   Wt 245 lb (111.1 kg)   SpO2 96%   BMI 36.18 kg/m   Visual Acuity Right Eye Distance:   Left Eye Distance:   Bilateral Distance:    Right Eye Near:   Left Eye Near:    Bilateral Near:     Physical Exam Vitals and nursing note reviewed.  Constitutional:      General: He is not in acute distress. HENT:     Ears:     Comments: L canal is 3/4 swollen and I cant see the TM, is red and tender with external ear motion R TM with mild redness on the canal, has a bulli on part of his TM Eyes:     General: No scleral icterus.    Conjunctiva/sclera: Conjunctivae normal.  Pulmonary:     Effort: Pulmonary effort is normal.  Musculoskeletal:     Cervical back: Neck supple.  Lymphadenopathy:     Cervical: No  cervical adenopathy.  Skin:    General: Skin is warm and dry.  Neurological:     Mental Status: He is alert and oriented to person, place, and time.     Comments: Uses a walker to ambulate  Psychiatric:        Mood and Affect: Mood normal.        Behavior: Behavior normal.        Thought Content: Thought content normal.        Judgment: Judgment normal.     UC Treatments / Results  Labs (all labs ordered are listed, but only abnormal results are displayed) Labs Reviewed - No data to display  EKG   Radiology No results found.  Procedures Procedures (including critical care time)  Medications Ordered in UC Medications - No data to display  Initial Impression / Assessment and Plan / UC Course  I have reviewed the triage vital signs and the nursing notes.  Has bilateral OE R bollous meringitis of R ear  I palced him on Zpack and Cortisporin otic gtts as noted.      Final Clinical Impressions(s) / UC Diagnoses   Final diagnoses:  Otitis externa of both ears, unspecified chronicity, unspecified type  Bullous myringitis of right ear     Discharge Instructions      Avoid water into your ears til you have healed. You have ear canal and R ear drum infections.  I cant see the L ear drum due to the swelling of the ear canal.  The Zpack is to cover for the ear drum infection. While on it stop the Colcricine     ED Prescriptions  Medication Sig Dispense Auth. Provider   azithromycin (ZITHROMAX Z-PAK) 250 MG tablet 2 today, then one qd x 4 days 6 tablet Rodriguez-Southworth, Liesel Peckenpaugh, PA-C   neomycin-polymyxin-hydrocortisone (CORTISPORIN) 3.5-10000-1 OTIC suspension Place 4 drops into both ears 3 (three) times daily. For 7 days 10 mL Rodriguez-Southworth, Sunday Spillers, PA-C      PDMP not reviewed this encounter.   Shelby Mattocks, PA-C 02/11/22 1352

## 2022-02-11 NOTE — ED Triage Notes (Signed)
Pt reports his ears on both sides started itching. Then they started to hurt externally and started to swell. Able to get his hearing aid in the right ear, but not in the left due to the swelling.

## 2023-12-08 ENCOUNTER — Other Ambulatory Visit: Payer: Self-pay

## 2023-12-08 ENCOUNTER — Emergency Department

## 2023-12-08 ENCOUNTER — Emergency Department
Admission: EM | Admit: 2023-12-08 | Discharge: 2023-12-08 | Disposition: A | Discharge status: Home / Self Care | Attending: Emergency Medicine | Admitting: Emergency Medicine

## 2023-12-08 ENCOUNTER — Encounter: Payer: Self-pay | Admitting: Emergency Medicine

## 2023-12-08 DIAGNOSIS — R112 Nausea with vomiting, unspecified: Secondary | ICD-10-CM | POA: Insufficient documentation

## 2023-12-08 DIAGNOSIS — R197 Diarrhea, unspecified: Secondary | ICD-10-CM | POA: Diagnosis not present

## 2023-12-08 DIAGNOSIS — D72829 Elevated white blood cell count, unspecified: Secondary | ICD-10-CM | POA: Insufficient documentation

## 2023-12-08 DIAGNOSIS — N183 Chronic kidney disease, stage 3 unspecified: Secondary | ICD-10-CM | POA: Diagnosis not present

## 2023-12-08 DIAGNOSIS — I129 Hypertensive chronic kidney disease with stage 1 through stage 4 chronic kidney disease, or unspecified chronic kidney disease: Secondary | ICD-10-CM | POA: Diagnosis not present

## 2023-12-08 DIAGNOSIS — R0602 Shortness of breath: Secondary | ICD-10-CM | POA: Insufficient documentation

## 2023-12-08 DIAGNOSIS — K5730 Diverticulosis of large intestine without perforation or abscess without bleeding: Secondary | ICD-10-CM | POA: Insufficient documentation

## 2023-12-08 LAB — COMPREHENSIVE METABOLIC PANEL WITH GFR
ALT: 10 U/L (ref 0–44)
AST: 21 U/L (ref 15–41)
Albumin: 3.7 g/dL (ref 3.5–5.0)
Alkaline Phosphatase: 80 U/L (ref 38–126)
Anion gap: 13 (ref 5–15)
BUN: 26 mg/dL — ABNORMAL HIGH (ref 8–23)
CO2: 19 mmol/L — ABNORMAL LOW (ref 22–32)
Calcium: 8.4 mg/dL — ABNORMAL LOW (ref 8.9–10.3)
Chloride: 108 mmol/L (ref 98–111)
Creatinine, Ser: 1.45 mg/dL — ABNORMAL HIGH (ref 0.61–1.24)
GFR, Estimated: 47 mL/min — ABNORMAL LOW (ref >60)
Glucose, Bld: 160 mg/dL — ABNORMAL HIGH (ref 70–99)
Potassium: 3.9 mmol/L (ref 3.5–5.1)
Sodium: 140 mmol/L (ref 135–145)
Total Bilirubin: 0.7 mg/dL (ref 0.0–1.2)
Total Protein: 6.5 g/dL (ref 6.5–8.1)

## 2023-12-08 LAB — URINALYSIS, ROUTINE W REFLEX MICROSCOPIC
Bacteria, UA: NONE SEEN
Bilirubin Urine: NEGATIVE
Glucose, UA: NEGATIVE mg/dL
Hgb urine dipstick: NEGATIVE
Ketones, ur: NEGATIVE mg/dL
Nitrite: NEGATIVE
Protein, ur: NEGATIVE mg/dL
Specific Gravity, Urine: 1.014 (ref 1.005–1.030)
pH: 6 (ref 5.0–8.0)

## 2023-12-08 LAB — CBC WITH DIFFERENTIAL/PLATELET
Abs Immature Granulocytes: 0.07 10*3/uL (ref 0.00–0.07)
Basophils Absolute: 0.1 10*3/uL (ref 0.0–0.1)
Basophils Relative: 1 %
Eosinophils Absolute: 0.1 10*3/uL (ref 0.0–0.5)
Eosinophils Relative: 1 %
HCT: 39.4 % (ref 39.0–52.0)
Hemoglobin: 12.8 g/dL — ABNORMAL LOW (ref 13.0–17.0)
Immature Granulocytes: 1 %
Lymphocytes Relative: 13 %
Lymphs Abs: 2 10*3/uL (ref 0.7–4.0)
MCH: 28.1 pg (ref 26.0–34.0)
MCHC: 32.5 g/dL (ref 30.0–36.0)
MCV: 86.4 fL (ref 80.0–100.0)
Monocytes Absolute: 0.7 10*3/uL (ref 0.1–1.0)
Monocytes Relative: 4 %
Neutro Abs: 12.5 10*3/uL — ABNORMAL HIGH (ref 1.7–7.7)
Neutrophils Relative %: 80 %
Platelets: 315 10*3/uL (ref 150–400)
RBC: 4.56 MIL/uL (ref 4.22–5.81)
RDW: 14.3 % (ref 11.5–15.5)
WBC: 15.5 10*3/uL — ABNORMAL HIGH (ref 4.0–10.5)
nRBC: 0 % (ref 0.0–0.2)

## 2023-12-08 LAB — LIPASE, BLOOD: Lipase: 44 U/L (ref 11–51)

## 2023-12-08 LAB — RESP PANEL BY RT-PCR (RSV, FLU A&B, COVID)  RVPGX2
Influenza A by PCR: NEGATIVE
Influenza B by PCR: NEGATIVE
Resp Syncytial Virus by PCR: NEGATIVE
SARS Coronavirus 2 by RT PCR: NEGATIVE

## 2023-12-08 MED ORDER — SODIUM CHLORIDE 0.9 % IV BOLUS
500.0000 mL | Freq: Once | INTRAVENOUS | Status: AC
  Administered 2023-12-08: 500 mL via INTRAVENOUS

## 2023-12-08 MED ORDER — ONDANSETRON HCL 4 MG/2ML IJ SOLN
4.0000 mg | Freq: Once | INTRAMUSCULAR | Status: AC
  Administered 2023-12-08: 4 mg via INTRAVENOUS

## 2023-12-08 MED ORDER — IOHEXOL 300 MG/ML  SOLN
100.0000 mL | Freq: Once | INTRAMUSCULAR | Status: AC | PRN
  Administered 2023-12-08: 100 mL via INTRAVENOUS

## 2023-12-08 MED ORDER — ONDANSETRON HCL 4 MG/2ML IJ SOLN
INTRAMUSCULAR | Status: AC
  Filled 2023-12-08: qty 2

## 2023-12-08 MED ORDER — ONDANSETRON HCL 4 MG/2ML IJ SOLN
4.0000 mg | Freq: Once | INTRAMUSCULAR | Status: AC
  Administered 2023-12-08: 4 mg via INTRAVENOUS
  Filled 2023-12-08: qty 2

## 2023-12-08 MED ORDER — SODIUM CHLORIDE 0.9 % IV BOLUS
1000.0000 mL | Freq: Once | INTRAVENOUS | Status: AC
  Administered 2023-12-08: 1000 mL via INTRAVENOUS

## 2023-12-08 MED ORDER — ONDANSETRON HCL 4 MG PO TABS: 4.0000 mg | ORAL_TABLET | Freq: Three times a day (TID) | ORAL | 0 refills | Status: DC | PRN

## 2023-12-08 NOTE — ED Notes (Signed)
 Pt returns from radiology c/o nausea. Wells, md made aware

## 2023-12-08 NOTE — ED Notes (Signed)
 Po challenge successful , wells, MD notified

## 2023-12-08 NOTE — ED Triage Notes (Signed)
 Patient to ED via [facility] from home for generalized weakness. Ongoing since 4am. Also having vomiting. Hx stroke, HTN, kidney failure. Initially 88% on RA- placed on 2L with EMS and 97%.

## 2023-12-08 NOTE — ED Provider Notes (Signed)
 [facility] Provider Note    Event Date/Time   First MD Initiated Contact with Patient 12/08/23 1855     (approximate)   History   Weakness   HPI Corey Johnson is a 83 y.o. male with history of HTN, BPH, CKD stage III presenting today for weakness.  Patient states he had onset of weakness and not feeling well earlier today.  Then had onset of nausea with vomiting.  Denies any specific cough, congestion, shortness of breath, chest pain, dysuria, body aches.  States he had 1 episode of diarrhea as well today.  Denies any abdominal pain anywhere.     Physical Exam   Triage Vital Signs: ED Triage Vitals  Encounter Vitals Group     BP 12/08/23 1854 139/73     Systolic BP Percentile --      Diastolic BP Percentile --      Pulse Rate 12/08/23 1854 74     Resp 12/08/23 1854 17     Temp 12/08/23 1854 (!) 97.5 F (36.4 C)     Temp Source 12/08/23 1854 Oral     SpO2 12/08/23 1854 99 %     Weight 12/08/23 1851 214 lb (97.1 kg)     Height 12/08/23 1851 5\' 9"  (1.753 m)     Head Circumference --      Peak Flow --      Pain Score 12/08/23 1850 0     Pain Loc --      Pain Education --      Exclude from Growth Chart --     Most recent vital signs: Vitals:   12/08/23 1854 12/08/23 2132  BP: 139/73 (!) 113/58  Pulse: 74 70  Resp: 17 16  Temp: (!) 97.5 F (36.4 C) 97.8 F (36.6 C)  SpO2: 99% 94%   Physical Exam: I have reviewed the vital signs and nursing notes. General: Awake, alert, no acute distress.  Fatigued appearing. Head:  Atraumatic, normocephalic.   ENT:  EOM intact, PERRL. Oral mucosa is pink and moist with no lesions. Neck: Neck is supple with full range of motion, No meningeal signs. Cardiovascular:  RRR, No murmurs. Peripheral pulses palpable and equal bilaterally. Respiratory:  Symmetrical chest wall expansion.  No rhonchi, rales, or wheezes.  Good air movement throughout.  No use of accessory muscles.   Musculoskeletal:  No cyanosis or  edema. Moving extremities with full ROM Abdomen:  Soft, nontender, nondistended. Neuro:  GCS 15, moving all four extremities, interacting appropriately. Speech clear. Psych:  Calm, appropriate.   Skin:  Warm, dry, no rash.    ED Results / Procedures / Treatments   Labs (all labs ordered are listed, but only abnormal results are displayed) Labs Reviewed  CBC WITH DIFFERENTIAL/PLATELET - Abnormal; Notable for the following components:      Result Value   WBC 15.5 (*)    Hemoglobin 12.8 (*)    Neutro Abs 12.5 (*)    All other components within normal limits  COMPREHENSIVE METABOLIC PANEL WITH GFR - Abnormal; Notable for the following components:   CO2 19 (*)    Glucose, Bld 160 (*)    BUN 26 (*)    Creatinine, Ser 1.45 (*)    Calcium 8.4 (*)    GFR, Estimated 47 (*)    All other components within normal limits  URINALYSIS, ROUTINE W REFLEX MICROSCOPIC - Abnormal; Notable for the following components:   Color, Urine YELLOW (*)    APPearance CLEAR (*)  Leukocytes,Ua MODERATE (*)    All other components within normal limits  RESP PANEL BY RT-PCR (RSV, FLU A&B, COVID)  RVPGX2  LIPASE, BLOOD     EKG My EKG interpretation: Rate of 68, normal sinus rhythm, normal axis, normal intervals.  No acute ST elevations or depressions   RADIOLOGY Independently interpreted CT abdomen/pelvis with no acute pathology.   PROCEDURES:  Critical Care performed: No  Procedures   MEDICATIONS ORDERED IN ED: Medications  sodium chloride 0.9 % bolus 1,000 mL (0 mLs Intravenous Stopped 12/08/23 2140)  ondansetron (ZOFRAN) injection 4 mg ( Intravenous Canceled Entry 12/08/23 2128)  iohexol (OMNIPAQUE) 300 MG/ML solution 100 mL (100 mLs Intravenous Contrast Given 12/08/23 2052)  ondansetron (ZOFRAN) injection 4 mg (4 mg Intravenous Given 12/08/23 2127)  sodium chloride 0.9 % bolus 500 mL (0 mLs Intravenous Stopped 12/08/23 2203)     IMPRESSION / MDM / ASSESSMENT AND PLAN / ED COURSE  I  reviewed the triage vital signs and the nursing notes.                              Differential diagnosis includes, but is not limited to, COVID, flu, RSV, UTI, pneumonia, dehydration, viral gastroenteritis  Patient's presentation is most consistent with acute complicated illness / injury requiring diagnostic workup.  Patient is an 83 year old male presenting today for weakness associate with nausea, vomiting, and diarrhea.  Physical exam mostly reassuring with no acute abdominal pain anywhere or other obvious symptoms besides weakness.  Vital signs are stable.  Patient initially given 1 L fluid along with Zofran.  Did result initial symptomatic improvement.  Laboratory workup with slight leukocytosis at 15.5 K.  CMP most notable for slight hypocarbia at 19 and BUN of 26 possibly indicating some dehydration.  UA with no sign of infection and respiratory panel negative.  CT imaging of abdomen/pelvis shows no acute intra-abdominal pathology.  Patient given additional dose of Zofran and 500 cc of fluid.  He was able to tolerate p.o. without any difficulty and feeling significantly better at this time.  Considered admission but patient does not have any acute intra-abdominal pathology requiring additional medication at this time and likely more viral enteritis source of symptoms.  He is tolerating p.o. with stable vital signs and safe for discharge.  Will send home on Zofran and given strict return precautions.  The patient is on the cardiac monitor to evaluate for evidence of arrhythmia and/or significant heart rate changes. Clinical Course as of 12/08/23 2231  Mon Dec 08, 2023  2035 Comprehensive metabolic panel(!) Mild hypocarbia but otherwise reassuring CMP [DW]  2230 Patient tolerating p.o. without any difficulty.  Will discharge with nausea medication [DW]    Clinical Course User Index [DW] Janith Lima, MD     FINAL CLINICAL IMPRESSION(S) / ED DIAGNOSES   Final diagnoses:  Nausea  vomiting and diarrhea     Rx / DC Orders   ED Discharge Orders          Ordered    ondansetron (ZOFRAN) 4 MG tablet  Every 8 hours PRN        12/08/23 2230             Note:  This document was prepared using Dragon voice recognition software and may include unintentional dictation errors.   Janith Lima, MD 12/08/23 [phone]

## 2023-12-08 NOTE — ED Notes (Signed)
Pt provided w/ gingerale and crackers for PO challenge

## 2023-12-08 NOTE — Discharge Instructions (Signed)
 I have sent nausea medication to your pharmacy to take as needed.  Please maintain hydration as this will be the biggest key early on.  Please follow-up with your primary care provider as needed or return to the emergency department for any severe worsening symptoms.  CT imaging of your belly was otherwise reassuring.

## 2024-05-27 DIAGNOSIS — G4733 Obstructive sleep apnea (adult) (pediatric): Secondary | ICD-10-CM | POA: Diagnosis not present

## 2024-06-10 ENCOUNTER — Observation Stay
Admission: EM | Admit: 2024-06-10 | Discharge: 2024-06-11 | Disposition: A | Discharge status: Home / Self Care | Attending: Internal Medicine | Admitting: Internal Medicine

## 2024-06-10 ENCOUNTER — Ambulatory Visit
Admission: RE | Admit: 2024-06-10 | Discharge: 2024-06-10 | Disposition: A | Discharge status: Other Acute Inpatient Hospital | Attending: Internal Medicine | Admitting: Internal Medicine

## 2024-06-10 ENCOUNTER — Other Ambulatory Visit: Payer: Self-pay

## 2024-06-10 ENCOUNTER — Emergency Department

## 2024-06-10 VITALS — BP 158/96 | HR 83 | Resp 20

## 2024-06-10 DIAGNOSIS — D631 Anemia in chronic kidney disease: Secondary | ICD-10-CM | POA: Insufficient documentation

## 2024-06-10 DIAGNOSIS — M1A9XX0 Chronic gout, unspecified, without tophus (tophi): Secondary | ICD-10-CM | POA: Insufficient documentation

## 2024-06-10 DIAGNOSIS — D649 Anemia, unspecified: Secondary | ICD-10-CM | POA: Diagnosis not present

## 2024-06-10 DIAGNOSIS — G2581 Restless legs syndrome: Secondary | ICD-10-CM | POA: Insufficient documentation

## 2024-06-10 DIAGNOSIS — R269 Unspecified abnormalities of gait and mobility: Secondary | ICD-10-CM | POA: Insufficient documentation

## 2024-06-10 DIAGNOSIS — Z7902 Long term (current) use of antithrombotics/antiplatelets: Secondary | ICD-10-CM | POA: Insufficient documentation

## 2024-06-10 DIAGNOSIS — E785 Hyperlipidemia, unspecified: Secondary | ICD-10-CM | POA: Diagnosis not present

## 2024-06-10 DIAGNOSIS — R531 Weakness: Secondary | ICD-10-CM | POA: Insufficient documentation

## 2024-06-10 DIAGNOSIS — R55 Syncope and collapse: Secondary | ICD-10-CM

## 2024-06-10 DIAGNOSIS — I129 Hypertensive chronic kidney disease with stage 1 through stage 4 chronic kidney disease, or unspecified chronic kidney disease: Secondary | ICD-10-CM | POA: Insufficient documentation

## 2024-06-10 DIAGNOSIS — I700 Atherosclerosis of aorta: Secondary | ICD-10-CM | POA: Diagnosis not present

## 2024-06-10 DIAGNOSIS — R29898 Other symptoms and signs involving the musculoskeletal system: Secondary | ICD-10-CM | POA: Diagnosis not present

## 2024-06-10 DIAGNOSIS — G459 Transient cerebral ischemic attack, unspecified: Principal | ICD-10-CM | POA: Insufficient documentation

## 2024-06-10 DIAGNOSIS — Z7982 Long term (current) use of aspirin: Secondary | ICD-10-CM | POA: Diagnosis not present

## 2024-06-10 DIAGNOSIS — E039 Hypothyroidism, unspecified: Secondary | ICD-10-CM | POA: Diagnosis not present

## 2024-06-10 DIAGNOSIS — R200 Anesthesia of skin: Secondary | ICD-10-CM | POA: Insufficient documentation

## 2024-06-10 DIAGNOSIS — Z8673 Personal history of transient ischemic attack (TIA), and cerebral infarction without residual deficits: Secondary | ICD-10-CM | POA: Diagnosis not present

## 2024-06-10 DIAGNOSIS — R220 Localized swelling, mass and lump, head: Secondary | ICD-10-CM | POA: Diagnosis not present

## 2024-06-10 DIAGNOSIS — R29810 Facial weakness: Secondary | ICD-10-CM | POA: Diagnosis not present

## 2024-06-10 DIAGNOSIS — Z79890 Hormone replacement therapy (postmenopausal): Secondary | ICD-10-CM | POA: Insufficient documentation

## 2024-06-10 DIAGNOSIS — N400 Enlarged prostate without lower urinary tract symptoms: Secondary | ICD-10-CM | POA: Insufficient documentation

## 2024-06-10 DIAGNOSIS — G4733 Obstructive sleep apnea (adult) (pediatric): Secondary | ICD-10-CM | POA: Insufficient documentation

## 2024-06-10 DIAGNOSIS — R4781 Slurred speech: Secondary | ICD-10-CM | POA: Diagnosis not present

## 2024-06-10 DIAGNOSIS — Z79899 Other long term (current) drug therapy: Secondary | ICD-10-CM | POA: Insufficient documentation

## 2024-06-10 DIAGNOSIS — N1831 Chronic kidney disease, stage 3a: Secondary | ICD-10-CM | POA: Diagnosis not present

## 2024-06-10 DIAGNOSIS — E1122 Type 2 diabetes mellitus with diabetic chronic kidney disease: Secondary | ICD-10-CM | POA: Diagnosis not present

## 2024-06-10 DIAGNOSIS — R42 Dizziness and giddiness: Secondary | ICD-10-CM | POA: Diagnosis not present

## 2024-06-10 DIAGNOSIS — R221 Localized swelling, mass and lump, neck: Secondary | ICD-10-CM | POA: Diagnosis not present

## 2024-06-10 DIAGNOSIS — R202 Paresthesia of skin: Secondary | ICD-10-CM

## 2024-06-10 DIAGNOSIS — N179 Acute kidney failure, unspecified: Secondary | ICD-10-CM | POA: Insufficient documentation

## 2024-06-10 DIAGNOSIS — M542 Cervicalgia: Secondary | ICD-10-CM

## 2024-06-10 DIAGNOSIS — Z87891 Personal history of nicotine dependence: Secondary | ICD-10-CM | POA: Diagnosis not present

## 2024-06-10 DIAGNOSIS — Z7901 Long term (current) use of anticoagulants: Secondary | ICD-10-CM | POA: Insufficient documentation

## 2024-06-10 DIAGNOSIS — I6389 Other cerebral infarction: Principal | ICD-10-CM | POA: Insufficient documentation

## 2024-06-10 LAB — CBC
HCT: 36.1 % — ABNORMAL LOW (ref 39.0–52.0)
Hemoglobin: 11.9 g/dL — ABNORMAL LOW (ref 13.0–17.0)
MCH: 27.8 pg (ref 26.0–34.0)
MCHC: 33 g/dL (ref 30.0–36.0)
MCV: 84.3 fL (ref 80.0–100.0)
Platelets: 317 K/uL (ref 150–400)
RBC: 4.28 MIL/uL (ref 4.22–5.81)
RDW: 14.3 % (ref 11.5–15.5)
WBC: 12.1 K/uL — ABNORMAL HIGH (ref 4.0–10.5)
nRBC: 0 % (ref 0.0–0.2)

## 2024-06-10 LAB — BASIC METABOLIC PANEL WITH GFR
Anion gap: 12 (ref 5–15)
BUN: 38 mg/dL — ABNORMAL HIGH (ref 8–23)
CO2: 23 mmol/L (ref 22–32)
Calcium: 8.9 mg/dL (ref 8.9–10.3)
Chloride: 103 mmol/L (ref 98–111)
Creatinine, Ser: 1.99 mg/dL — ABNORMAL HIGH (ref 0.61–1.24)
GFR, Estimated: 32 mL/min — ABNORMAL LOW (ref >60)
Glucose, Bld: 114 mg/dL — ABNORMAL HIGH (ref 70–99)
Potassium: 3.7 mmol/L (ref 3.5–5.1)
Sodium: 138 mmol/L (ref 135–145)

## 2024-06-10 LAB — TROPONIN I (HIGH SENSITIVITY)
Troponin I (High Sensitivity): 9 ng/L (ref <18)
Troponin I (High Sensitivity): 7 ng/L (ref <18)

## 2024-06-10 LAB — GLUCOSE, CAPILLARY: Glucose-Capillary: 136 mg/dL — ABNORMAL HIGH (ref 70–99)

## 2024-06-10 MED ORDER — SODIUM CHLORIDE 0.9 % IV BOLUS
500.0000 mL | Freq: Once | INTRAVENOUS | Status: AC
  Administered 2024-06-10: 500 mL via INTRAVENOUS

## 2024-06-10 MED ORDER — ACETAMINOPHEN 325 MG PO TABS
650.0000 mg | ORAL_TABLET | ORAL | Status: DC | PRN
  Administered 2024-06-11: 650 mg via ORAL
  Filled 2024-06-10: qty 2

## 2024-06-10 MED ORDER — SODIUM CHLORIDE 0.9 % IV SOLN: INTRAVENOUS | Status: DC

## 2024-06-10 MED ORDER — HEPARIN SODIUM (PORCINE) 5000 UNIT/ML IJ SOLN
5000.0000 [IU] | Freq: Three times a day (TID) | INTRAMUSCULAR | Status: DC
Start: 2024-06-11 — End: 2024-06-11
  Administered 2024-06-11 (×3): 5000 [IU] via SUBCUTANEOUS
  Filled 2024-06-10 (×3): qty 1

## 2024-06-10 MED ORDER — STROKE: EARLY STAGES OF RECOVERY BOOK: Freq: Once | Status: AC

## 2024-06-10 MED ORDER — SENNOSIDES-DOCUSATE SODIUM 8.6-50 MG PO TABS: 1.0000 | ORAL_TABLET | Freq: Every evening | ORAL | Status: DC | PRN

## 2024-06-10 MED ORDER — ACETAMINOPHEN 160 MG/5ML PO SOLN: 650.0000 mg | ORAL | Status: DC | PRN

## 2024-06-10 MED ORDER — ACETAMINOPHEN 650 MG RE SUPP: 650.0000 mg | RECTAL | Status: DC | PRN

## 2024-06-10 NOTE — Discharge Instructions (Signed)

## 2024-06-10 NOTE — ED Triage Notes (Signed)
 Pt arrived via EMS from [facility] urgent care due to having a syncopal episode. PER UC note, pt check in for facial and neck swelling then went unresponsive while waiting to be seen there. EMS sts that pt is orthostatic positive.

## 2024-06-10 NOTE — ED Notes (Signed)
 Blood sugar 136

## 2024-06-10 NOTE — ED Notes (Signed)
Fall bundle in place

## 2024-06-10 NOTE — ED Provider Notes (Signed)
 [facility] Provider Note    Event Date/Time   First MD Initiated Contact with Patient 06/10/24 1906     (approximate)   History   Near Syncope   HPI  Corey Johnson is a 83 y.o. male who presents to the emergency department today because of concerns for left sided facial swelling.  Patient states he has been having some left neck pain over the past week.  He was at urgent care today because of that pain.  Whilst there the patient started having swelling and feeling different in his left face.  He also noticed some right upper extremity weakness.  He says that the symptoms were similar to when he had a stroke a couple of years ago.  At the time of my exam his face and arm feel more back to normal.     Physical Exam   Triage Vital Signs: ED Triage Vitals  Encounter Vitals Group     BP 06/10/24 1800 (!) 158/96     Girls Systolic BP Percentile --      Girls Diastolic BP Percentile --      Boys Systolic BP Percentile --      Boys Diastolic BP Percentile --      Pulse Rate 06/10/24 1800 83     Resp 06/10/24 1800 20     Temp 06/10/24 1856 98.1 F (36.7 C)     Temp src --      SpO2 06/10/24 1800 96 %     Weight 06/10/24 1846 214 lb 1.1 oz (97.1 kg)     Height --      Head Circumference --      Peak Flow --      Pain Score 06/10/24 1846 0     Pain Loc --      Pain Education --      Exclude from Growth Chart --     Most recent vital signs: Vitals:   06/10/24 1953 06/10/24 2000  BP: (!) 144/74 127/64  Pulse: 68 74  Resp: 17 18  Temp:    SpO2: 100% 98%   General: Awake, alert, oriented. CV:  Good peripheral perfusion. Regular rate and rhythm. Resp:  Normal effort. Lungs clear. Abd:  No distention.    ED Results / Procedures / Treatments   Labs (all labs ordered are listed, but only abnormal results are displayed) Labs Reviewed  BASIC METABOLIC PANEL WITH GFR - Abnormal; Notable for the following components:      Result Value   Glucose,  Bld 114 (*)    BUN 38 (*)    Creatinine, Ser 1.99 (*)    GFR, Estimated 32 (*)    All other components within normal limits  CBC - Abnormal; Notable for the following components:   WBC 12.1 (*)    Hemoglobin 11.9 (*)    HCT 36.1 (*)    All other components within normal limits  TROPONIN I (HIGH SENSITIVITY)  TROPONIN I (HIGH SENSITIVITY)     EKG  I, Guadalupe Eagles, attending physician, personally viewed and interpreted this EKG  EKG Time: 1859 Rate: 60 Rhythm: sinus rhythm Axis: normal Intervals: qtc 422 QRS: no st elevation ST changes: narrow Impression: normal ekg    RADIOLOGY I independently interpreted and visualized the CXR. My interpretation: No pneumonia Radiology interpretation:  IMPRESSION:  1. No acute abnormalities.    I independently interpreted and visualized the MR brain. My interpretation: cerebellar infarct Radiology interpretation:  IMPRESSION:  1. Moderate cerebellar infarct.  2. Multifocal hyperintense T2-weighted signal within the cerebral white matter,  most commonly due to chronic small vessel disease.     PROCEDURES:  Critical Care performed: No   MEDICATIONS ORDERED IN ED: Medications  sodium chloride  0.9 % bolus 500 mL (500 mLs Intravenous New Bag/Given 06/10/24 2015)     IMPRESSION / MDM / ASSESSMENT AND PLAN / ED COURSE  I reviewed the triage vital signs and the nursing notes.                              Differential diagnosis includes, but is not limited to, CVA, TIA, electrolyte abnormality, carotid stenosis  Patient's presentation is most consistent with acute presentation with potential threat to life or bodily function.   Patient presented to the emergency department today because of concern for facial numbness and right upper extremity numbness that occurred while patient was at urgent care for left neck pain. At the time of my exam patient without focal neuro deficits. Given history of endarterectomy did have  concern for possible CVA. MR was obtained which did not show acute stroke. Discussed with patient. Will plan on admitting for TIA work up.     FINAL CLINICAL IMPRESSION(S) / ED DIAGNOSES   Final diagnoses:  TIA (transient ischemic attack)      Note:  This document was prepared using Dragon voice recognition software and may include unintentional dictation errors.    Floy Roberts, MD 06/11/24 [phone]

## 2024-06-10 NOTE — H&P (Signed)
 History and Physical    Patient: Corey Johnson DOB: [birth year] DOA: 06/10/2024 DOS: the patient was seen and examined on 06/10/2024 PCP: Fernand Fredy RAMAN, MD  Patient coming from: {Point_of_Origin:[address]}  Chief Complaint:  Chief Complaint  Patient presents with   Near Syncope   HPI: Corey Johnson is a 83 y.o. male with medical history significant of ***  Review of Systems: {ROS_Text:26778} Past Medical History:  Diagnosis Date   Arthritis    BPH (benign prostatic hyperplasia)    CRF (chronic renal failure)    Gout    H. pylori infection    Hyperlipemia    Hypertension    Psoriasis    Renal disorder    Renal insufficiency    Sleep apnea    CPAP   Vitamin D  deficiency    Past Surgical History:  Procedure Laterality Date   CHOLECYSTECTOMY N/A 02/06/2015   Procedure: LAPAROSCOPIC CHOLECYSTECTOMY WITH INTRAOPERATIVE CHOLANGIOGRAM;  Surgeon: [name], MD;  Location: [facility];  Service: General;  Laterality: N/A;   COLONOSCOPY WITH PROPOFOL  N/A 06/13/2015   Procedure: COLONOSCOPY WITH PROPOFOL ;  Surgeon: [name], MD;  Location: [facility];  Service: Endoscopy;  Laterality: N/A;   CYSTOSCOPY N/A 04/27/2015   Procedure: [name], CLOT EVACUATION ;  Surgeon: [name], MD;  Location: [facility];  Service: Urology;  Laterality: N/A;   GREEN LIGHT LASER TURP (TRANSURETHRAL RESECTION OF PROSTATE N/A 03/28/2015   Procedure: GREEN LIGHT LASER TURP (TRANSURETHRAL RESECTION OF PROSTATE;  Surgeon: [name], MD;  Location: [facility];  Service: Urology;  Laterality: N/A;   JOINT REPLACEMENT     left knee   KNEE ARTHROSCOPY     REPLACEMENT TOTAL KNEE     SHOULDER SURGERY Right    TRANSURETHRAL RESECTION OF PROSTATE     Social History:  reports that he has quit smoking. His smoking use included cigarettes. He has never used smokeless tobacco. He reports that he does not drink alcohol and does not use drugs.  Allergies  Allergen Reactions    Lisinopril     Other reaction(s): Other (See Comments) hyperkalemia   Metoprolol Other (See Comments)    Other reaction(s): Other (See Comments) severe cramps   Tapentadol      Other reaction(s): Hallucination   Tramadol Other (See Comments)   Nsaids Other (See Comments)    Kidney disease   Simvastatin Other (See Comments)    fatigue    Family History  Problem Relation Age of Onset   Hypertension Mother    Pneumonia Father     Prior to Admission medications   Medication Sig Start Date End Date Taking? Authorizing Provider  acetaminophen  (TYLENOL ) 500 MG tablet Take 1,000 mg by mouth every 6 (six) hours as needed for mild pain or moderate pain.    [provider]  alfuzosin (UROXATRAL) 10 MG 24 hr tablet Take 10 mg by mouth daily. 01/23/22   [provider]  allopurinol  (ZYLOPRIM ) 100 MG tablet Take 100 mg by mouth daily. 04/11/15   [provider]  allopurinol  (ZYLOPRIM ) 300 MG tablet Take by mouth. 02/09/21   [provider]  alum & mag hydroxide-simeth (GELUSIL) 200-200-25 MG suspension Chew 1 tablet by mouth 3 (three) times daily as needed for indigestion, heartburn or flatulence. 04/24/15   Tobie Press, MD  amLODipine  (NORVASC ) 5 MG tablet Take by mouth.    [provider]  azithromycin  (ZITHROMAX  Z-PAK) 250 MG tablet 2 today, then one qd x 4 days  02/11/22   Rodriguez-Southworth, Sylvia, PA-C  bisacodyl  (DULCOLAX) 10 MG suppository Place 1 suppository (10 mg total) rectally daily as needed for moderate constipation. 04/28/15   Kassie Ozell SAUNDERS, MD  Cholecalciferol  1000 UNITS tablet Take 2,000 Units by mouth daily.     [provider]  clotrimazole -betamethasone  (LOTRISONE ) cream Apply topically 2 (two) times daily. 04/28/15   Kassie Ozell SAUNDERS, MD  colchicine  0.6 MG tablet Take 0.6 mg by mouth daily as needed. For gout flare ups.    [provider]  dutasteride  (AVODART ) 0.5 MG capsule Take 0.5 mg by mouth daily.    [provider]  enalapril  (VASOTEC ) 20 MG tablet Take 20 mg by mouth daily.    [provider]  hydrochlorothiazide  (HYDRODIURIL ) 25 MG tablet Take 25 mg by mouth daily.    [provider]  hyoscyamine  (LEVSIN  SL) 0.125 MG SL tablet Place 1 tablet (0.125 mg total) under the tongue every 4 (four) hours as needed (bladder spasm). 04/28/15   Kassie Ozell SAUNDERS, MD  levofloxacin  (LEVAQUIN ) 500 MG tablet Take 1 tablet by mouth daily. 04/25/15   [provider]  levothyroxine  (SYNTHROID , LEVOTHROID) 50 MCG tablet Take 1 tablet (50 mcg total) by mouth daily before breakfast. 04/24/15 04/29/15  Tobie Press, MD  Meth-Hyo-M Starlene Phos-Ph Sal (URIBEL ) 118 MG CAPS Take 1 capsule (118 mg total) by mouth every 6 (six) hours as needed. 03/28/15   Kassie Ozell SAUNDERS, MD  neomycin -polymyxin-hydrocortisone (CORTISPORIN) 3.5-10000-1 OTIC suspension Place 4 drops into both ears 3 (three) times daily. For 7 days 02/11/22   Rodriguez-Southworth, Kyra, PA-C  NUCYNTA  50 MG TABS tablet Take 1 tablet (50 mg total) by mouth every 6 (six) hours as needed. 1 TO 2 TABS Q 6 HOURS PRN PAIN 03/28/15   Kassie Ozell SAUNDERS, MD  omeprazole (PRILOSEC) 20 MG capsule Take 20 mg by mouth daily as needed (for heartburn/indigestion).     [provider]  ondansetron  (ZOFRAN  ODT) 4 MG disintegrating tablet Take 1 tablet (4 mg total) by mouth every 8 (eight) hours as needed. 04/11/19   Edelmiro Leash, MD  ondansetron  (ZOFRAN ) 4 MG tablet Take 1 tablet (4 mg total) by mouth every 8 (eight) hours as needed. 12/08/23 12/07/24  Malvina Alm DASEN, MD  pravastatin  (PRAVACHOL ) 10 MG tablet Take 10 mg by mouth at bedtime.     [provider]  rOPINIRole  (REQUIP ) 1 MG tablet Take 2 mg by mouth every evening.     [provider]  tamsulosin  (FLOMAX ) 0.4 MG CAPS capsule Take 0.4 mg by mouth daily.    [provider]    Physical Exam: Vitals:   06/10/24 2110 06/10/24 2112 06/10/24 2130 06/10/24 2300   BP: (S) 116/71 (S) 130/72 121/70 123/77  Pulse: 71 88 75 78  Resp: (!) 24 18 12 17   Temp:      SpO2:  99% 98% 100%  Weight:       Cardiovascular:     Rate and Rhythm: Normal rate and regular rhythm.     Pulses: Normal pulses.     Heart sounds: Normal heart sounds.  Pulmonary:     Effort: Pulmonary effort is normal. No respiratory distress.     Breath sounds: Normal breath sounds. No wheezing, rhonchi or rales.  Abdominal:     General: Abdomen is flat. Bowel sounds are normal. There is no distension.     Palpations: Abdomen is soft.     Tenderness: There is no abdominal tenderness.  Musculoskeletal:  General: No deformity. Normal range of motion.  Skin:    General: Skin is warm and dry.   Neurological:     General: No focal deficit present.     Mental Status: He is alert and oriented to person, place, and time. Mental status is at baseline.   Data Reviewed: {Tip this will not be part of the note when signed- Document your independent interpretation of telemetry tracing, EKG, lab, Radiology test or any other diagnostic tests. Add any new diagnostic test ordered today. (Optional):26781} {Results:26384}  Assessment and Plan:   AKI on CKD stage IIIa Patient presented with creatinine 1.99 however baseline creatinine around 1.5  Normocytic anemia Hemoglobin 11.9 Monitor CBC closely No indication for transfusion at this time     Advance Care Planning:   Code Status: Prior ***  Consults: ***  Family Communication: ***  Severity of Illness: {Observation/Inpatient:21159}  Author: Drue ONEIDA Potter, MD 06/10/2024 11:26 PM  For on call review [email or web address].

## 2024-06-10 NOTE — ED Triage Notes (Signed)
 Pt came into urgent care when standing to get to  go to the registration desk he got dizzy and got pt to sit in chair then he slumped over in his chair. Brought him back to room and got EKG, blood sugar and vs. EMS called. Daughter was with pt and stated it started at 82 he had the facial swelling and neck pain then when registering pt daughter stated it got worse and he became unresponsive at 5:45pm today.

## 2024-06-10 NOTE — ED Provider Notes (Signed)
 [facility]    CSN: [id number] Arrival date & time: 06/10/24  1740      History   Chief Complaint Chief Complaint  Patient presents with   Neck Injury    Face swollen - Entered by patient    HPI Corey Johnson is a 83 y.o. male brought in by his daughter for evaluation of neck pain, left-sided facial/jaw swelling, left-sided facial numbness that began about 6 hours ago, around noon.  Since being in the urgent care patient had a syncopal episode at the front desk.  He lost his balance and started to fall.  He was caught by nursing staff and moved to stretcher. He was unconscious for several seconds but became responsive after sternal rub.  Denying headaches.  He does report feeling dizzy and like a weight is holding his left arm down.  Has difficulty raising left arm.  Patient became diaphoretic.  Denies chest pain or shortness of breath.  Daughter states he has a history of stroke in 2022 and symptoms are the same as when he had a stroke.  He is only on aspirin, no other anticoagulants.  Of note, symptoms acutely worsened since being in the urgent care.  HPI  Past Medical History:  Diagnosis Date   Arthritis    BPH (benign prostatic hyperplasia)    CRF (chronic renal failure)    Gout    H. pylori infection    Hyperlipemia    Hypertension    Psoriasis    Renal disorder    Renal insufficiency    Sleep apnea    CPAP   Vitamin D  deficiency     Patient Active Problem List   Diagnosis Date Noted   History of colonic polyps    Rectal polyp    Benign neoplasm of transverse colon    Clot hematuria 04/27/2015   Sepsis (HCC) 04/22/2015   Lower urinary tract infectious disease 04/22/2015   Hematuria 04/22/2015   Gout 02/13/2015   ARF (acute renal failure) 02/13/2015   Abdominal distension 02/13/2015   HTN (hypertension) 02/13/2015   Psoriasis 02/13/2015   BPH (benign prostatic hyperplasia) 02/13/2015   RLS (restless legs syndrome) 02/13/2015   Allergic rhinitis  02/13/2015   GERD (gastroesophageal reflux disease) 02/13/2015   Sleep apnea 02/13/2015   CRI (chronic renal insufficiency) 02/13/2015   Acute renal failure 02/13/2015    Past Surgical History:  Procedure Laterality Date   CHOLECYSTECTOMY N/A 02/06/2015   Procedure: LAPAROSCOPIC CHOLECYSTECTOMY WITH INTRAOPERATIVE CHOLANGIOGRAM;  Surgeon: [name], MD;  Location: [facility];  Service: General;  Laterality: N/A;   COLONOSCOPY WITH PROPOFOL  N/A 06/13/2015   Procedure: COLONOSCOPY WITH PROPOFOL ;  Surgeon: [name], MD;  Location: [facility];  Service: Endoscopy;  Laterality: N/A;   CYSTOSCOPY N/A 04/27/2015   Procedure: [name], CLOT EVACUATION ;  Surgeon: [name], MD;  Location: [facility];  Service: Urology;  Laterality: N/A;   GREEN LIGHT LASER TURP (TRANSURETHRAL RESECTION OF PROSTATE N/A 03/28/2015   Procedure: GREEN LIGHT LASER TURP (TRANSURETHRAL RESECTION OF PROSTATE;  Surgeon: [name], MD;  Location: [facility];  Service: Urology;  Laterality: N/A;   JOINT REPLACEMENT     left knee   KNEE ARTHROSCOPY     REPLACEMENT TOTAL KNEE     SHOULDER SURGERY Right    TRANSURETHRAL RESECTION OF PROSTATE         Home Medications    Prior to Admission medications   Medication Sig Start Date End Date  Taking? Authorizing Provider  acetaminophen  (TYLENOL ) 500 MG tablet Take 1,000 mg by mouth every 6 (six) hours as needed for mild pain or moderate pain.    [provider]  alfuzosin (UROXATRAL) 10 MG 24 hr tablet Take 10 mg by mouth daily. 01/23/22   [provider]  allopurinol  (ZYLOPRIM ) 100 MG tablet Take 100 mg by mouth daily. 04/11/15   [provider]  allopurinol  (ZYLOPRIM ) 300 MG tablet Take by mouth. 02/09/21   [provider]  alum & mag hydroxide-simeth (GELUSIL) 200-200-25 MG suspension Chew 1 tablet by mouth 3 (three) times daily as needed for indigestion, heartburn or flatulence. 04/24/15   Tobie Press, MD   amLODipine  (NORVASC ) 5 MG tablet Take by mouth.    [provider]  azithromycin  (ZITHROMAX  Z-PAK) 250 MG tablet 2 today, then one qd x 4 days 02/11/22   Rodriguez-Southworth, Sylvia, PA-C  bisacodyl  (DULCOLAX) 10 MG suppository Place 1 suppository (10 mg total) rectally daily as needed for moderate constipation. 04/28/15   Kassie Ozell SAUNDERS, MD  Cholecalciferol  1000 UNITS tablet Take 2,000 Units by mouth daily.     [provider]  clotrimazole -betamethasone  (LOTRISONE ) cream Apply topically 2 (two) times daily. 04/28/15   Kassie Ozell SAUNDERS, MD  colchicine  0.6 MG tablet Take 0.6 mg by mouth daily as needed. For gout flare ups.    [provider]  dutasteride  (AVODART ) 0.5 MG capsule Take 0.5 mg by mouth daily.    [provider]  enalapril  (VASOTEC ) 20 MG tablet Take 20 mg by mouth daily.    [provider]  hydrochlorothiazide  (HYDRODIURIL ) 25 MG tablet Take 25 mg by mouth daily.    [provider]  hyoscyamine  (LEVSIN  SL) 0.125 MG SL tablet Place 1 tablet (0.125 mg total) under the tongue every 4 (four) hours as needed (bladder spasm). 04/28/15   Kassie Ozell SAUNDERS, MD  levofloxacin  (LEVAQUIN ) 500 MG tablet Take 1 tablet by mouth daily. 04/25/15   [provider]  levothyroxine  (SYNTHROID , LEVOTHROID) 50 MCG tablet Take 1 tablet (50 mcg total) by mouth daily before breakfast. 04/24/15 04/29/15  Tobie Press, MD  Meth-Hyo-M Starlene Phos-Ph Sal (URIBEL ) 118 MG CAPS Take 1 capsule (118 mg total) by mouth every 6 (six) hours as needed. 03/28/15   Kassie Ozell SAUNDERS, MD  neomycin -polymyxin-hydrocortisone (CORTISPORIN) 3.5-10000-1 OTIC suspension Place 4 drops into both ears 3 (three) times daily. For 7 days 02/11/22   Rodriguez-Southworth, Kyra, PA-C  NUCYNTA  50 MG TABS tablet Take 1 tablet (50 mg total) by mouth every 6 (six) hours as needed. 1 TO 2 TABS Q 6 HOURS PRN PAIN 03/28/15   Kassie Ozell SAUNDERS, MD  omeprazole (PRILOSEC) 20 MG capsule Take 20 mg  by mouth daily as needed (for heartburn/indigestion).     [provider]  ondansetron  (ZOFRAN  ODT) 4 MG disintegrating tablet Take 1 tablet (4 mg total) by mouth every 8 (eight) hours as needed. 04/11/19   Edelmiro Leash, MD  ondansetron  (ZOFRAN ) 4 MG tablet Take 1 tablet (4 mg total) by mouth every 8 (eight) hours as needed. 12/08/23 12/07/24  Malvina Alm DASEN, MD  pravastatin  (PRAVACHOL ) 10 MG tablet Take 10 mg by mouth at bedtime.     [provider]  rOPINIRole  (REQUIP ) 1 MG tablet Take 2 mg by mouth every evening.     [provider]  tamsulosin  (FLOMAX ) 0.4 MG CAPS capsule Take 0.4 mg by mouth daily.    [provider]    Family History Family  History  Problem Relation Age of Onset   Hypertension Mother    Pneumonia Father     Social History Social History   Tobacco Use   Smoking status: Former    Types: Cigarettes   Smokeless tobacco: Never  Substance Use Topics   Alcohol use: No    Alcohol/week: 0.0 standard drinks of alcohol   Drug use: No     Allergies   Lisinopril, Metoprolol, Tapentadol , Tramadol, Nsaids, and Simvastatin   Review of Systems Review of Systems  Constitutional:  Positive for diaphoresis and fatigue.  Cardiovascular:  Negative for chest pain and palpitations.  Gastrointestinal:  Negative for vomiting.  Musculoskeletal:  Positive for gait problem.  Neurological:  Positive for dizziness, syncope, weakness and numbness. Negative for headaches.  Psychiatric/Behavioral:  Negative for confusion.      Physical Exam Triage Vital Signs ED Triage Vitals  Encounter Vitals Group     BP      Girls Systolic BP Percentile      Girls Diastolic BP Percentile      Boys Systolic BP Percentile      Boys Diastolic BP Percentile      Pulse      Resp      Temp      Temp src      SpO2      Weight      Height      Head Circumference      Peak Flow      Pain Score      Pain Loc      Pain Education      Exclude from  Growth Chart    No data found.  Updated Vital Signs There were no vitals taken for this visit.     BP 158/96>1 dayTemp --Pulse Rate 83>1 dayResp 20>1 daySpO2 96%>1 dayWt  219 lb 9.3 oz  (99.6 kg)  Physical Exam Vitals and nursing note reviewed.  Constitutional:      General: He is not in acute distress.    Appearance: Normal appearance. He is well-developed. He is diaphoretic.     Comments: Slurred speech  HENT:     Head: Normocephalic and atraumatic.  Eyes:     General: No scleral icterus.    Extraocular Movements: Extraocular movements intact.     Conjunctiva/sclera: Conjunctivae normal.     Pupils: Pupils are equal, round, and reactive to light.  Cardiovascular:     Rate and Rhythm: Normal rate and regular rhythm.  Pulmonary:     Effort: Pulmonary effort is normal. No respiratory distress.     Breath sounds: Normal breath sounds.  Musculoskeletal:     Cervical back: Neck supple.  Skin:    General: Skin is warm.     Capillary Refill: Capillary refill takes less than 2 seconds.  Neurological:     Mental Status: He is alert and oriented to person, place, and time.     Motor: Weakness (left upper extremity weakness) present.     Comments: Slightly downturned corner of left mouth   Psychiatric:        Mood and Affect: Mood normal.      UC Treatments / Results  Labs (all labs ordered are listed, but only abnormal results are displayed) Labs Reviewed  GLUCOSE, CAPILLARY - Abnormal; Notable for the following components:      Result Value   Glucose-Capillary 136 (*)    All other components within normal limits    EKG  Normal sinus rhythm  with regular rate. 77 bpm. No ST-T wave changes.   Radiology No results found.  Procedures Procedures (including critical care time)  Medications Ordered in UC Medications - No data to display  Initial Impression / Assessment and Plan / UC Course  I have reviewed the triage vital signs and the nursing  notes.  Pertinent labs & imaging results that were available during my care of the patient were reviewed by me and considered in my medical decision making (see chart for details).   83 year old male with history of hypertension, stroke, chronic kidney disease, hyperlipidemia, sleep apnea, gout presents with daughter for onset of left-sided facial numbness, swelling and neck pain at 12 PM today.  At 5:45 PM he started to have slurred speech, diaphoresis and left upper extremity weakness as well as difficulty with balance and syncopal episode in the urgent care.  Vitals stable. Mildly elevated BP 150/96. Otherwise normal vitals.   Left corner of mouth drooping. Left arm weakness. Slurred speech.   Fingerstick glucose is 136  EKG shows normal sinus rhythm. Regular rate. No ST-T wave changes.   Likely acute CVA/TIA. EMS contacted. Full report given. Discussed my concerns for possible CVA/TIA. Symptom onset 12 pm with acute worsening 5:45 PM. Patient is leaving in stable condition in route [facility]. Likely admission and extensive workup.    Final Clinical Impressions(s) / UC Diagnoses   Final diagnoses:  Facial paresthesia  Left-sided weakness  Neck pain  Syncope, unspecified syncope type  History of stroke  Dizziness  Slurred speech     Discharge Instructions      You have been advised to follow up immediately in the emergency department for concerning signs.symptoms. If you declined EMS transport, please have a family member take you directly to the ED at this time. Do not delay. Based on concerns about condition, if you do not follow up in th e ED, you may risk poor outcomes including worsening of condition, delayed treatment and potentially life threatening issues. If you have declined to go to the ED at this time, you should call your PCP immediately to set up a follow up appointment.  Go to ED for red flag symptoms, including; fevers you cannot reduce with Tylenol /Motrin, severe  headaches, vision changes, numbness/weakness in part of the body, lethargy, confusion, intractable vomiting, severe dehydration, chest pain, breathing difficulty, severe persistent abdominal or pelvic pain, signs of severe infection (increased redness, swelling of an area), feeling faint or passing out, dizziness, etc. You should especially go to the ED for sudden acute worsening of condition if you do not elect to go at this time.     ED Prescriptions   None    PDMP not reviewed this encounter.   Arvis Jolan NOVAK, PA-C 06/14/24 [phone]

## 2024-06-11 ENCOUNTER — Encounter: Payer: Self-pay | Admitting: Internal Medicine

## 2024-06-11 ENCOUNTER — Observation Stay
Admit: 2024-06-11 | Discharge: 2024-06-11 | Disposition: A | Discharge status: Home / Self Care | Attending: Internal Medicine | Admitting: Internal Medicine

## 2024-06-11 ENCOUNTER — Observation Stay

## 2024-06-11 DIAGNOSIS — R569 Unspecified convulsions: Secondary | ICD-10-CM

## 2024-06-11 DIAGNOSIS — R55 Syncope and collapse: Secondary | ICD-10-CM

## 2024-06-11 DIAGNOSIS — G459 Transient cerebral ischemic attack, unspecified: Secondary | ICD-10-CM | POA: Diagnosis not present

## 2024-06-11 DIAGNOSIS — I6523 Occlusion and stenosis of bilateral carotid arteries: Secondary | ICD-10-CM | POA: Diagnosis not present

## 2024-06-11 DIAGNOSIS — G458 Other transient cerebral ischemic attacks and related syndromes: Secondary | ICD-10-CM | POA: Diagnosis not present

## 2024-06-11 DIAGNOSIS — E119 Type 2 diabetes mellitus without complications: Secondary | ICD-10-CM | POA: Diagnosis not present

## 2024-06-11 DIAGNOSIS — I6622 Occlusion and stenosis of left posterior cerebral artery: Secondary | ICD-10-CM | POA: Diagnosis not present

## 2024-06-11 DIAGNOSIS — I10 Essential (primary) hypertension: Secondary | ICD-10-CM | POA: Diagnosis not present

## 2024-06-11 LAB — GLUCOSE, CAPILLARY
Glucose-Capillary: 100 mg/dL — ABNORMAL HIGH (ref 70–99)
Glucose-Capillary: 123 mg/dL — ABNORMAL HIGH (ref 70–99)
Glucose-Capillary: 124 mg/dL — ABNORMAL HIGH (ref 70–99)
Glucose-Capillary: 90 mg/dL (ref 70–99)

## 2024-06-11 LAB — BASIC METABOLIC PANEL WITH GFR
Anion gap: 10 (ref 5–15)
BUN: 32 mg/dL — ABNORMAL HIGH (ref 8–23)
CO2: 23 mmol/L (ref 22–32)
Calcium: 8.5 mg/dL — ABNORMAL LOW (ref 8.9–10.3)
Chloride: 105 mmol/L (ref 98–111)
Creatinine, Ser: 1.68 mg/dL — ABNORMAL HIGH (ref 0.61–1.24)
GFR, Estimated: 40 mL/min — ABNORMAL LOW (ref >60)
Glucose, Bld: 108 mg/dL — ABNORMAL HIGH (ref 70–99)
Potassium: 3.9 mmol/L (ref 3.5–5.1)
Sodium: 138 mmol/L (ref 135–145)

## 2024-06-11 LAB — CBC WITH DIFFERENTIAL/PLATELET
Abs Immature Granulocytes: 0.06 K/uL (ref 0.00–0.07)
Basophils Absolute: 0.1 K/uL (ref 0.0–0.1)
Basophils Relative: 1 %
Eosinophils Absolute: 0.1 K/uL (ref 0.0–0.5)
Eosinophils Relative: 1 %
HCT: 34.8 % — ABNORMAL LOW (ref 39.0–52.0)
Hemoglobin: 11.6 g/dL — ABNORMAL LOW (ref 13.0–17.0)
Immature Granulocytes: 1 %
Lymphocytes Relative: 21 %
Lymphs Abs: 2.6 K/uL (ref 0.7–4.0)
MCH: 27.9 pg (ref 26.0–34.0)
MCHC: 33.3 g/dL (ref 30.0–36.0)
MCV: 83.7 fL (ref 80.0–100.0)
Monocytes Absolute: 0.8 K/uL (ref 0.1–1.0)
Monocytes Relative: 7 %
Neutro Abs: 8.3 K/uL — ABNORMAL HIGH (ref 1.7–7.7)
Neutrophils Relative %: 69 %
Platelets: UNDETERMINED K/uL (ref 150–400)
RBC: 4.16 MIL/uL — ABNORMAL LOW (ref 4.22–5.81)
RDW: 14.1 % (ref 11.5–15.5)
WBC: 12 K/uL — ABNORMAL HIGH (ref 4.0–10.5)
nRBC: 0 % (ref 0.0–0.2)

## 2024-06-11 LAB — HEMOGLOBIN A1C
Hgb A1c MFr Bld: 6.2 % — ABNORMAL HIGH (ref 4.8–5.6)
Mean Plasma Glucose: 131.24 mg/dL

## 2024-06-11 LAB — LIPID PANEL
Cholesterol: 103 mg/dL (ref 0–200)
HDL: 36 mg/dL — ABNORMAL LOW (ref >40)
LDL Cholesterol: 52 mg/dL (ref 0–99)
Total CHOL/HDL Ratio: 2.9 ratio
Triglycerides: 75 mg/dL (ref <150)
VLDL: 15 mg/dL (ref 0–40)

## 2024-06-11 LAB — ECHOCARDIOGRAM COMPLETE
AR max vel: 3.15 cm2
AV Area VTI: 4.2 cm2
AV Area mean vel: 2.99 cm2
AV Mean grad: 3.5 mmHg
AV Peak grad: 6.3 mmHg
Ao pk vel: 1.26 m/s
Area-P 1/2: 2.81 cm2
Height: 69 in
MV VTI: 2.68 cm2
S' Lateral: 3.2 cm
Weight: 3513.25 [oz_av]

## 2024-06-11 MED ORDER — INFLUENZA VAC SPLIT HIGH-DOSE 0.5 ML IM SUSY
0.5000 mL | PREFILLED_SYRINGE | INTRAMUSCULAR | Status: DC
Start: 2024-06-12 — End: 2024-06-11

## 2024-06-11 MED ORDER — INSULIN ASPART 100 UNIT/ML IJ SOLN: 0.0000 [IU] | Freq: Every day | INTRAMUSCULAR | Status: DC

## 2024-06-11 MED ORDER — DUTASTERIDE 0.5 MG PO CAPS: 0.5000 mg | ORAL_CAPSULE | Freq: Every day | ORAL | Status: DC

## 2024-06-11 MED ORDER — INSULIN ASPART 100 UNIT/ML IJ SOLN
0.0000 [IU] | Freq: Three times a day (TID) | INTRAMUSCULAR | Status: DC
  Administered 2024-06-11: 1 [IU] via SUBCUTANEOUS
  Filled 2024-06-11: qty 1

## 2024-06-11 MED ORDER — CLOPIDOGREL BISULFATE 75 MG PO TABS
75.0000 mg | ORAL_TABLET | Freq: Every day | ORAL | Status: DC
  Administered 2024-06-11: 75 mg via ORAL
  Filled 2024-06-11: qty 1

## 2024-06-11 MED ORDER — ROPINIROLE HCL 1 MG PO TABS: 1.0000 mg | ORAL_TABLET | Freq: Every day | ORAL | Status: DC

## 2024-06-11 MED ORDER — CLOPIDOGREL BISULFATE 75 MG PO TABS: 75.0000 mg | ORAL_TABLET | Freq: Every day | ORAL | 0 refills | Status: AC

## 2024-06-11 MED ORDER — ATORVASTATIN CALCIUM 20 MG PO TABS
80.0000 mg | ORAL_TABLET | Freq: Every day | ORAL | Status: DC
  Administered 2024-06-11: 80 mg via ORAL
  Filled 2024-06-11: qty 4

## 2024-06-11 MED ORDER — LEVOTHYROXINE SODIUM 50 MCG PO TABS
50.0000 ug | ORAL_TABLET | Freq: Every day | ORAL | Status: DC
  Administered 2024-06-11: 50 ug via ORAL
  Filled 2024-06-11: qty 1

## 2024-06-11 MED ORDER — BISACODYL 10 MG RE SUPP
10.0000 mg | Freq: Every day | RECTAL | Status: DC | PRN
Start: 2024-06-11 — End: 2024-06-11

## 2024-06-11 MED ORDER — ASPIRIN 81 MG PO TBEC
81.0000 mg | DELAYED_RELEASE_TABLET | Freq: Every day | ORAL | Status: DC
  Administered 2024-06-11: 81 mg via ORAL
  Filled 2024-06-11: qty 1

## 2024-06-11 MED ORDER — AMLODIPINE BESYLATE 5 MG PO TABS
5.0000 mg | ORAL_TABLET | Freq: Every day | ORAL | Status: DC
  Administered 2024-06-11: 5 mg via ORAL
  Filled 2024-06-11: qty 1

## 2024-06-11 MED ORDER — TAMSULOSIN HCL 0.4 MG PO CAPS
0.4000 mg | ORAL_CAPSULE | Freq: Every day | ORAL | Status: DC
Start: 2024-06-11 — End: 2024-06-11

## 2024-06-11 MED ORDER — ALLOPURINOL 100 MG PO TABS
100.0000 mg | ORAL_TABLET | Freq: Every day | ORAL | Status: DC
  Administered 2024-06-11: 100 mg via ORAL
  Filled 2024-06-11: qty 1

## 2024-06-11 MED ORDER — PANTOPRAZOLE SODIUM 40 MG PO TBEC
40.0000 mg | DELAYED_RELEASE_TABLET | Freq: Every day | ORAL | Status: DC
Start: 2024-06-11 — End: 2024-06-11
  Administered 2024-06-11: 40 mg via ORAL
  Filled 2024-06-11: qty 1

## 2024-06-11 MED ORDER — PRAVASTATIN SODIUM 20 MG PO TABS: 10.0000 mg | ORAL_TABLET | Freq: Every day | ORAL | Status: DC

## 2024-06-11 NOTE — Progress Notes (Signed)
..         Patient Goals and CMS Choice        Expected Discharge Plan and Services                                                Prior Living Arrangements/Services             Brief Assessment Note  Patient is from home with TIA and Acute on Chronic Kidney Injury. Medical record reviewed and patient has no TOC needs at this time. Please outreach to [facility] if needs are identified.           Activities of Daily Living   ADL Screening (condition at time of admission) Independently performs ADLs?: Yes (appropriate for developmental age) Is the patient deaf or have difficulty hearing?: Yes Does the patient have difficulty seeing, even when wearing glasses/contacts?: No Does the patient have difficulty concentrating, remembering, or making decisions?: No  Permission Sought/Granted                  Emotional Assessment              Admission diagnosis:  TIA (transient ischemic attack) [G45.9] Transient ischemic attack [G45.9] Patient Active Problem List   Diagnosis Date Noted   Transient ischemic attack 06/10/2024   History of colonic polyps    Rectal polyp    Benign neoplasm of transverse colon    Clot hematuria 04/27/2015   Sepsis (HCC) 04/22/2015   Lower urinary tract infectious disease 04/22/2015   Hematuria 04/22/2015   Gout 02/13/2015   ARF (acute renal failure) 02/13/2015   Abdominal distension 02/13/2015   HTN (hypertension) 02/13/2015   Psoriasis 02/13/2015   BPH (benign prostatic hyperplasia) 02/13/2015   RLS (restless legs syndrome) 02/13/2015   Allergic rhinitis 02/13/2015   GERD (gastroesophageal reflux disease) 02/13/2015   Sleep apnea 02/13/2015   CRI (chronic renal insufficiency) 02/13/2015   Acute renal failure 02/13/2015   PCP:  Fernand Fredy RAMAN, MD Pharmacy:   [id number] [place], [place] - [address] AT [facility] OF [address] [facility] [place] [address] Phone: [phone] Fax:  [phone]  [facility] [address], [place] - [address] [place] [address] Phone: [phone] Fax: [phone]  [facility] - [place], [place] - [address] [place] [address] Phone: [phone] Fax: [phone]     Social Determinants of Health (SDOH) Interventions    Readmission Risk Interventions     No data to display

## 2024-06-11 NOTE — Discharge Summary (Signed)
 Physician Discharge Summary  Corey Johnson FMW:[id number] DOB: 1941-06-24 DOA: 06/10/2024  PCP: Fernand Fredy RAMAN, MD  Admit date: 06/10/2024 Discharge date: 06/11/2024  Admitted From: Home Disposition:  Home  Recommendations for Outpatient Follow-up:  Follow up with PCP in 1-2 weeks   Home Health:No  Equipment/Devices:None   Discharge Condition:Stable  CODE STATUS:FULL  Diet recommendation: Reg  Brief/Interim Summary: 83 y.o. male with medical history significant of hypertension, BPH, gout, hyperlipidemia, OSA, psoriasis, previous stroke, diabetes mellitus type 2 who was otherwise well until around 12 noon today which was 6 hours before presentation when patient started experiencing left facial swelling with associated numbness and therefore went to the urgent care.  Upon arrival to the urgent care patient started having left-sided upper extremity weakness and later weakness involving bilateral legs and therefore passed out lost consciousness for some few seconds.  Patient was called falling down by staff.  Did not have any seizure episode, no record of urinary or bowel incontinence.  Patient was subsequently brought to the emergency room for further management.  In 2022 patient underwent carotid endarterectomy at [facility] after an acute stroke.  Upon arrival to the emergency room patient was back to his baseline with complete resolution of his symptoms.     Discharge Diagnoses:  Principal Problem:   Transient ischemic attack  Left-sided numbness and weakness secondary to possible transient ischemic attack Findings consistent with right hemispheric TIA.  Patient already on home aspirin.  Will add Plavix for DAPT.  3 week course of DAPT followed by ASA monotherapy.  Continue home statin.  2D echocardiogram reassuring.  Normal ejection fraction with grade 1 diastolic dysfunction.  EEG negative.  Therapy evaluated patient and had no recommendations for outpatient follow-up.  Patient back to baseline  neurologic function at time of discharge.  Stable for discharge home.  Outpatient referral to neurology 8-12 weeks.  Discharge Instructions   Allergies as of 06/11/2024       Reactions   Lisinopril    Other reaction(s): Other (See Comments) hyperkalemia   Metoprolol Other (See Comments)   Other reaction(s): Other (See Comments) severe cramps   Tapentadol     Other reaction(s): Hallucination   Tramadol Other (See Comments)   Nsaids Other (See Comments)   Kidney disease   Simvastatin Other (See Comments)   fatigue        Medication List     STOP taking these medications    alfuzosin 10 MG 24 hr tablet Commonly known as: UROXATRAL   clotrimazole -betamethasone  cream Commonly known as: LOTRISONE    colchicine  0.6 MG tablet   dutasteride  0.5 MG capsule Commonly known as: AVODART    Farxiga 10 MG Tabs tablet Generic drug: dapagliflozin propanediol   hyoscyamine  0.125 MG SL tablet Commonly known as: LEVSIN  SL   levofloxacin  500 MG tablet Commonly known as: LEVAQUIN    neomycin -polymyxin-hydrocortisone 3.5-10000-1 OTIC suspension Commonly known as: CORTISPORIN   Nucynta  50 MG tablet Generic drug: tapentadol    ondansetron  4 MG disintegrating tablet Commonly known as: Zofran  ODT   ondansetron  4 MG tablet Commonly known as: Zofran    pravastatin  10 MG tablet Commonly known as: PRAVACHOL    tamsulosin  0.4 MG Caps capsule Commonly known as: FLOMAX    Uribel  118 MG Caps       TAKE these medications    acetaminophen  500 MG tablet Commonly known as: TYLENOL  Take 1,000 mg by mouth every 6 (six) hours as needed for mild pain or moderate pain.   allopurinol  300 MG tablet Commonly known as: ZYLOPRIM   Take by mouth. What changed: Another medication with the same name was removed. Continue taking this medication, and follow the directions you see here.   alum & mag hydroxide-simeth 200-200-25 MG chewable tablet Commonly known as: GELUSIL Chew 1 tablet by mouth 3  (three) times daily as needed for indigestion, heartburn or flatulence.   amLODipine  5 MG tablet Commonly known as: NORVASC  Take 5 mg by mouth at bedtime.   aspirin 81 MG chewable tablet Chew 81 mg by mouth daily.   atorvastatin 80 MG tablet Commonly known as: LIPITOR Take 80 mg by mouth daily.   azithromycin  250 MG tablet Commonly known as: Zithromax  Z-Pak 2 today, then one qd x 4 days   bisacodyl  10 MG suppository Commonly known as: DULCOLAX Place 1 suppository (10 mg total) rectally daily as needed for moderate constipation.   Cholecalciferol  25 MCG (1000 UT) tablet Take 5,000 Units by mouth daily.   clopidogrel 75 MG tablet Commonly known as: PLAVIX Take 1 tablet (75 mg total) by mouth daily for 21 days. Start taking on: June 12, 2024   enalapril  20 MG tablet Commonly known as: VASOTEC  Take 20 mg by mouth daily.   gabapentin 100 MG capsule Commonly known as: NEURONTIN Take 100 mg by mouth 2 (two) times daily.   hydrochlorothiazide  25 MG tablet Commonly known as: HYDRODIURIL  Take 25 mg by mouth daily.   levocetirizine 5 MG tablet Commonly known as: XYZAL Take 5 mg by mouth daily.   levothyroxine  50 MCG tablet Commonly known as: SYNTHROID  Take 1 tablet (50 mcg total) by mouth daily before breakfast.   omeprazole 20 MG capsule Commonly known as: PRILOSEC Take 20 mg by mouth daily as needed (for heartburn/indigestion).   rOPINIRole  1 MG tablet Commonly known as: REQUIP  Take 1 mg by mouth every evening.   Rybelsus 3 MG Tabs Generic drug: Semaglutide Take 1 tablet by mouth every morning.        Allergies  Allergen Reactions   Lisinopril     Other reaction(s): Other (See Comments) hyperkalemia   Metoprolol Other (See Comments)    Other reaction(s): Other (See Comments) severe cramps   Tapentadol      Other reaction(s): Hallucination   Tramadol Other (See Comments)   Nsaids Other (See Comments)    Kidney disease   Simvastatin Other (See  Comments)    fatigue    Consultations: Neurology   Procedures/Studies:     Subjective: Seen and examined on the day of discharge.  Stable no distress.  Back to baseline.  Stable for discharge home.  Discharge Exam: Vitals:   06/11/24 1137 06/11/24 1537  BP: 122/68 (!) 141/73  Pulse: 63 62  Resp: 18 19  Temp: 98.3 F (36.8 C) 97.9 F (36.6 C)  SpO2: 94% 95%   Vitals:   06/11/24 0537 06/11/24 0737 06/11/24 1137 06/11/24 1537  BP: 122/66 123/70 122/68 (!) 141/73  Pulse: (!) 58 (!) 59 63 62  Resp: 18 17 18 19   Temp: (!) 97.5 F (36.4 C) 98.3 F (36.8 C) 98.3 F (36.8 C) 97.9 F (36.6 C)  TempSrc:  Oral Oral Oral  SpO2: 94% 95% 94% 95%  Weight:      Height:        General: Pt is alert, awake, not in acute distress Cardiovascular: RRR, S1/S2 +, no rubs, no gallops Respiratory: CTA bilaterally, no wheezing, no rhonchi Abdominal: Soft, NT, ND, bowel sounds + Extremities: no edema, no cyanosis    The results of significant diagnostics from  this hospitalization (including imaging, microbiology, ancillary and laboratory) are listed below for reference.     Microbiology: No results found for this or any previous visit (from the past 240 hours).   Labs: BNP (last 3 results) No results for input(s): BNP in the last 8760 hours. Basic Metabolic Panel: Recent Labs  Lab 06/10/24 1858 06/11/24 0333  NA 138 138  K 3.7 3.9  CL 103 105  CO2 23 23  GLUCOSE 114* 108*  BUN 38* 32*  CREATININE 1.99* 1.68*  CALCIUM 8.9 8.5*   Liver Function Tests: No results for input(s): AST, ALT, ALKPHOS, BILITOT, PROT, ALBUMIN in the last 168 hours. No results for input(s): LIPASE, AMYLASE in the last 168 hours. No results for input(s): AMMONIA in the last 168 hours. CBC: Recent Labs  Lab 06/10/24 1858 06/11/24 0333  WBC 12.1* 12.0*  NEUTROABS  --  8.3*  HGB 11.9* 11.6*  HCT 36.1* 34.8*  MCV 84.3 83.7  PLT 317 PLATELET CLUMPS NOTED ON SMEAR, UNABLE  TO ESTIMATE   Cardiac Enzymes: No results for input(s): CKTOTAL, CKMB, CKMBINDEX, TROPONINI in the last 168 hours. BNP: Invalid input(s): POCBNP CBG: Recent Labs  Lab 06/10/24 1754 06/11/24 0040 06/11/24 0737 06/11/24 1138 06/11/24 1625  GLUCAP 136* 124* 100* 123* 90   D-Dimer No results for input(s): DDIMER in the last 72 hours. Hgb A1c Recent Labs    06/11/24 0333  HGBA1C 6.2*   Lipid Profile Recent Labs    06/11/24 0333  CHOL 103  HDL 36*  LDLCALC 52  TRIG 75  CHOLHDL 2.9   Thyroid function studies No results for input(s): TSH, T4TOTAL, T3FREE, THYROIDAB in the last 72 hours.  Invalid input(s): FREET3 Anemia work up No results for input(s): VITAMINB12, FOLATE, FERRITIN, TIBC, IRON, RETICCTPCT in the last 72 hours. Urinalysis    Component Value Date/Time   COLORURINE YELLOW (A) 12/08/2023 1946   APPEARANCEUR CLEAR (A) 12/08/2023 1946   APPEARANCEUR Hazy 08/04/2012 1635   LABSPEC 1.014 12/08/2023 1946   LABSPEC 1.016 08/04/2012 1635   PHURINE 6.0 12/08/2023 1946   GLUCOSEU NEGATIVE 12/08/2023 1946   GLUCOSEU Negative 08/04/2012 1635   HGBUR NEGATIVE 12/08/2023 1946   BILIRUBINUR NEGATIVE 12/08/2023 1946   BILIRUBINUR Negative 08/04/2012 1635   KETONESUR NEGATIVE 12/08/2023 1946   PROTEINUR NEGATIVE 12/08/2023 1946   NITRITE NEGATIVE 12/08/2023 1946   LEUKOCYTESUR MODERATE (A) 12/08/2023 1946   LEUKOCYTESUR 2+ 08/04/2012 1635   Sepsis Labs Recent Labs  Lab 06/10/24 1858 06/11/24 0333  WBC 12.1* 12.0*   Microbiology No results found for this or any previous visit (from the past 240 hours).   Time coordinating discharge: 40 minutes  SIGNED:   Calvin KATHEE Robson, MD  [facility] 06/11/2024, 5:31 PM Pager   If 7PM-7AM, please contact night-coverage

## 2024-06-11 NOTE — Plan of Care (Signed)

## 2024-06-11 NOTE — Consult Note (Addendum)
 NEUROLOGY CONSULT NOTE   Date of service: June 11, 2024 Patient Name: Corey Johnson MRN:  [id number] DOB:  [birth year] Chief Complaint: Left-sided weakness Requesting Provider: Jhonny Calvin NOVAK, MD  History of Present Illness  Corey Johnson is a 83 y.o. male with hx of left carotid stenosis status post CEA, prior strokes with minimal residual right-sided weakness/gait difficulty, presented for evaluation of syncopal episode as well as transient left-sided weakness noted at his urgent care appointment yesterday.  Family noted some facial asymmetry with possible left-sided facial swelling versus droop for which they went to urgent care where the patient had a feeling of dropping his blood pressures and syncopized.  This lasted for about 10 minutes before he came to.  There was no postictal lethargy phase.  He has had some lightheadedness in the past but not to the extent of losing consciousness. When examined at the urgent care, he had drift in the left upper and lower extremity and left facial droop.  All the symptoms resolved by the time he was seen in the hospital.  He was admitted for TIA workup.   LKW: Sometime around noon yesterday Modified rankin score: 3-Moderate disability-requires help but walks WITHOUT assistance IV Thrombolysis: No-resolved symptoms EVT: Same as above  NIHSS components Score: Comment  1a Level of Conscious 0[x]  1[]  2[]  3[]      1b LOC Questions 0[x]  1[]  2[]       1c LOC Commands 0[x]  1[]  2[]       2 Best Gaze 0[x]  1[]  2[]       3 Visual 0[x]  1[]  2[]  3[]      4 Facial Palsy 0[x]  1[]  2[]  3[]      5a Motor Arm - left 0[x]  1[]  2[]  3[]  4[]  UN[]    5b Motor Arm - Right 0[x]  1[]  2[]  3[]  4[]  UN[]    6a Motor Leg - Left 0[x]  1[]  2[]  3[]  4[]  UN[]    6b Motor Leg - Right 0[x]  1[]  2[]  3[]  4[]  UN[]    7 Limb Ataxia 0[x]  1[]  2[]  UN[]      8 Sensory 0[x]  1[]  2[]  UN[]      9 Best Language 0[x]  1[]  2[]  3[]      10 Dysarthria 0[x]  1[]  2[]  UN[]      11 Extinct. and Inattention 0[x]   1[]  2[]       TOTAL: 0      ROS  Comprehensive ROS performed and pertinent positives documented in HPI   Past History   Past Medical History:  Diagnosis Date   Arthritis    BPH (benign prostatic hyperplasia)    CRF (chronic renal failure)    Gout    H. pylori infection    Hyperlipemia    Hypertension    Psoriasis    Renal disorder    Renal insufficiency    Sleep apnea    CPAP   Vitamin D  deficiency     Past Surgical History:  Procedure Laterality Date   CHOLECYSTECTOMY N/A 02/06/2015   Procedure: LAPAROSCOPIC CHOLECYSTECTOMY WITH INTRAOPERATIVE CHOLANGIOGRAM;  Surgeon: [name], MD;  Location: [facility];  Service: General;  Laterality: N/A;   COLONOSCOPY WITH PROPOFOL  N/A 06/13/2015   Procedure: COLONOSCOPY WITH PROPOFOL ;  Surgeon: [name], MD;  Location: [facility];  Service: Endoscopy;  Laterality: N/A;   CYSTOSCOPY N/A 04/27/2015   Procedure: [name], CLOT EVACUATION ;  Surgeon: [name], MD;  Location: [facility];  Service: Urology;  Laterality: N/A;   GREEN LIGHT LASER TURP (TRANSURETHRAL RESECTION OF PROSTATE N/A 03/28/2015   Procedure:  GREEN LIGHT LASER TURP (TRANSURETHRAL RESECTION OF PROSTATE;  Surgeon: [name], MD;  Location: [facility];  Service: Urology;  Laterality: N/A;   JOINT REPLACEMENT     left knee   KNEE ARTHROSCOPY     REPLACEMENT TOTAL KNEE     SHOULDER SURGERY Right    TRANSURETHRAL RESECTION OF PROSTATE      Family History: Family History  Problem Relation Age of Onset   Hypertension Mother    Pneumonia Father     Social History  reports that he has quit smoking. His smoking use included cigarettes. He has never used smokeless tobacco. He reports that he does not drink alcohol and does not use drugs.  Allergies  Allergen Reactions   Lisinopril     Other reaction(s): Other (See Comments) hyperkalemia   Metoprolol Other (See Comments)    Other reaction(s): Other (See Comments) severe cramps    Tapentadol      Other reaction(s): Hallucination   Tramadol Other (See Comments)   Nsaids Other (See Comments)    Kidney disease   Simvastatin Other (See Comments)    fatigue    Medications   Current Facility-Administered Medications:     stroke: early stages of recovery book, , Does not apply, Once, Djan, Drue DASEN, MD   acetaminophen  (TYLENOL ) tablet 650 mg, 650 mg, Oral, Q4H PRN **OR** acetaminophen  (TYLENOL ) 160 MG/5ML solution 650 mg, 650 mg, Per Tube, Q4H PRN **OR** acetaminophen  (TYLENOL ) suppository 650 mg, 650 mg, Rectal, Q4H PRN, Dorinda, Drue DASEN, MD   allopurinol  (ZYLOPRIM ) tablet 100 mg, 100 mg, Oral, Daily, Djan, Prince T, MD, 100 mg at 06/11/24 0920   amLODipine  (NORVASC ) tablet 5 mg, 5 mg, Oral, Daily, Djan, Prince T, MD, 5 mg at 06/11/24 0920   aspirin EC tablet 81 mg, 81 mg, Oral, Daily, Djan, Prince T, MD, 81 mg at 06/11/24 0919   atorvastatin (LIPITOR) tablet 80 mg, 80 mg, Oral, Daily, Djan, Prince T, MD, 80 mg at 06/11/24 9081   bisacodyl  (DULCOLAX) suppository 10 mg, 10 mg, Rectal, Daily PRN, Dorinda Drue DASEN, MD   dutasteride  (AVODART ) capsule 0.5 mg, 0.5 mg, Oral, Daily, Djan, Drue DASEN, MD   heparin  injection 5,000 Units, 5,000 Units, Subcutaneous, Q8H, Djan, Prince T, MD, 5,000 Units at 06/11/24 0627   [START ON 06/12/2024] Influenza vac split trivalent PF (FLUZONE HIGH-DOSE) injection 0.5 mL, 0.5 mL, Intramuscular, Tomorrow-1000, Djan, Prince T, MD   insulin aspart (novoLOG) injection 0-5 Units, 0-5 Units, Subcutaneous, QHS, Djan, Prince T, MD   insulin aspart (novoLOG) injection 0-9 Units, 0-9 Units, Subcutaneous, TID WC, Djan, Prince T, MD   levothyroxine  (SYNTHROID ) tablet 50 mcg, 50 mcg, Oral, Q0600, Djan, Prince T, MD, 50 mcg at 06/11/24 9372   pantoprazole  (PROTONIX ) EC tablet 40 mg, 40 mg, Oral, Daily, Djan, Prince T, MD, 40 mg at 06/11/24 0920   rOPINIRole  (REQUIP ) tablet 1 mg, 1 mg, Oral, QHS, Djan, Prince T, MD   senna-docusate (Senokot-S) tablet 1 tablet, 1  tablet, Oral, QHS PRN, Djan, Drue DASEN, MD   tamsulosin  (FLOMAX ) capsule 0.4 mg, 0.4 mg, Oral, Daily, Dorinda Drue T, MD  Vitals   Vitals:   06/11/24 0027 06/11/24 0031 06/11/24 0537 06/11/24 0737  BP:  104/67 122/66 123/70  Pulse:  61 (!) 58 (!) 59  Resp: 18 18 18 17   Temp:  97.7 F (36.5 C) (!) 97.5 F (36.4 C) 98.3 F (36.8 C)  TempSrc:  Oral  Oral  SpO2:  98% 94% 95%  Weight:  99.6 kg    Height:  5' 9 (1.753 m)      Body mass index is 32.43 kg/m.   Physical Exam   General: Awake alert in no distress HEENT: Normocephalic atraumatic Lungs: Clear Cardiovascular: Regular rate rhythm Neurological examination Awake alert oriented x 3 The speech is mildly dysarthric but there are says that that is his baseline. No aphasia Cranial nerves II to XII grossly intact Motor examination with no drift in any of the 4 extremities Sensation intact to light touch without extinction Coordination examination with no dysmetria  Labs/Imaging/Neurodiagnostic studies   CBC:  Recent Labs  Lab June 19, 2024 1858 06/11/24 0333  WBC 12.1* 12.0*  NEUTROABS  --  8.3*  HGB 11.9* 11.6*  HCT 36.1* 34.8*  MCV 84.3 83.7  PLT 317 PLATELET CLUMPS NOTED ON SMEAR, UNABLE TO ESTIMATE   Basic Metabolic Panel:  Lab Results  Component Value Date   NA 138 06/11/2024   K 3.9 06/11/2024   CO2 23 06/11/2024   GLUCOSE 108 (H) 06/11/2024   BUN 32 (H) 06/11/2024   CREATININE 1.68 (H) 06/11/2024   CALCIUM 8.5 (L) 06/11/2024   GFRNONAA 40 (L) 06/11/2024   GFRAA 47 (L) 04/10/2019   Lipid Panel:  Lab Results  Component Value Date   LDLCALC 52 06/11/2024   HgbA1c:  Lab Results  Component Value Date   HGBA1C 6.2 (H) 06/11/2024   INR  Lab Results  Component Value Date   INR 1.13 02/06/2015   APTT  Lab Results  Component Value Date   APTT 36 02/06/2015   Imaging personally reviewed MR brain with chronic cerebellar infarct, no new findings Carotid ultrasound: Mild plaque at the level of  both proximal internal carotid arteries.  No significant carotid stenosis identified and estimated bilateral ICA stenosis of less than 50% MRA head pending 2D echo pending  ASSESSMENT   Corey Johnson is a 83 y.o. male  With past medical history as above presenting for evaluation after a syncopal episode and also left-sided facial droop and left-sided weakness that was seen sometime yesterday.  Symptoms have resolved MRI brain is negative for acute process. Carotid ultrasound does not reveal significant stenosis bilaterally Symptoms likely consistent with a right hemispheric TIA but due to the syncopal episode, will look at the echocardiogram as well as order an EEG.  Impression: Likely right hemispheric TIA, evaluate for seizure  RECOMMENDATIONS  Frequent rechecks on telemetry On home aspirin.  Add Plavix for 3 weeks and then continue aspirin only His LDL is at goal.  He is on pravastatin  10 at home.  Continue home dose. 2D echo-pending Intracranial vessel imaging-MRI head without contrast.  Avoiding CTA due to deranged renal function. EEG-pending Therapy assessments Permissive hypertension-allow permissive hypertension and treat only if systolic blood pressures greater than 220 for the next 24 to 48 hours.  Eventual blood pressure goal should be normotension.  Avoid hypotension.  Will follow-up on the above studies with you. Plan discussed with Dr. Jhonny  ______________________________________________________________________    Signed, Eligio Lav, MD [facility]   Addendum [phone] 2D echo LVEF 60 to 65%, grade 1 diastolic dysfunction, LA size normal.  Tiny PFO cannot be excluded. At this time, even if he has a PFO, not a candidate for closure since there is no stroke and he has large vessel risk factors that are more likely to cause strokes. EEG has been completed-read pending.  MR angiogram head completed-pending read. Unless the MRA or EEG are abnormal, should  be okay to be discharged today Rest of the recommendations as above Discussed with Dr. Jhonny   ADDENDUM 1730 EEG unremarkable. MRA head with multiple stenoses intracranially--Left M2 moderate and left  callosal marginal moderate. Could have contributed to syncope. I would recommend the following --ASA+Plavix for 3 weeks followed by aspirin only (not clearly iCAD as a source for ?TIA) --Rest of the recs as above. Follow up outpatient neurology 8-12 weeks. Plan discussed with Dr. Jhonny. -- Eligio Lav, MD Neurology

## 2024-06-11 NOTE — Plan of Care (Signed)
  Problem: Education: Goal: Knowledge of disease or condition will improve Outcome: Progressing   Problem: Ischemic Stroke/TIA Tissue Perfusion: Goal: Complications of ischemic stroke/TIA will be minimized Outcome: Progressing   Problem: Coping: Goal: Will verbalize positive feelings about self Outcome: Progressing Goal: Will identify appropriate support needs Outcome: Progressing   Problem: Health Behavior/Discharge Planning: Goal: Ability to manage health-related needs will improve Outcome: Progressing Goal: Goals will be collaboratively established with patient/family Outcome: Progressing   Problem: Self-Care: Goal: Ability to participate in self-care as condition permits will improve Outcome: Progressing Goal: Verbalization of feelings and concerns over difficulty with self-care will improve Outcome: Progressing Goal: Ability to communicate needs accurately will improve Outcome: Progressing   Problem: Nutrition: Goal: Risk of aspiration will decrease Outcome: Progressing Goal: Dietary intake will improve Outcome: Progressing

## 2024-06-11 NOTE — Evaluation (Signed)
 Occupational Therapy Evaluation Patient Details Name: Corey Johnson MRN: [id number] DOB: 03/26/1941 Today's Date: 06/11/2024   History of Present Illness   Corey Johnson is a 83 y.o. male with medical history significant of hypertension, BPH, gout, hyperlipidemia, OSA, psoriasis, previous stroke, diabetes mellitus type 2. Pt presented to the ED after episode of syncope at urgent care. Prior to this pt had experienced LUE/L sided facial numbness. Pt admitted for TIA work up. MRI significant for moderate cerebellar infarct.     Clinical Impressions Corey Johnson was seen for OT evaluation this date. Prior to hospital admission, pt was independent-modified independent in all aspects of ADL/IADL management , using a 4WW for all mobility, and denies falls history in past 6 months. Pt lives with his daughter in a 1 story home with 1 step to enter. Currently pt reporting symptoms have resolved. Pt demonstrates baseline independence to perform ADL and mobility tasks and no strength, sensory, coordination, cognitive, or visual deficits appreciated with assessment. Pt daughter at bedside, states pt appears back to his usual level of independence. No concerns noted at this time. No skilled OT needs identified. Will sign off. Please re-consult if additional OT needs arise.      If plan is discharge home, recommend the following:   Assist for transportation;Assistance with cooking/housework;Help with stairs or ramp for entrance     Functional Status Assessment   Patient has not had a recent decline in their functional status     Equipment Recommendations   None recommended by OT     Recommendations for Other Services         Precautions/Restrictions   Precautions Precautions: Fall Recall of Precautions/Restrictions: Intact Restrictions Weight Bearing Restrictions Per Provider Order: No     Mobility Bed Mobility Overal bed mobility: Modified Independent             General  bed mobility comments: Used bed rails.    Transfers Overall transfer level: Modified independent Equipment used: Rolling walker (2 wheels)               General transfer comment: STS from EOB and amb in room/hall with RW, good safety awareness, and control with RW. No phyical assist required.      Balance Overall balance assessment: No apparent balance deficits (not formally assessed), Modified Independent                                         ADL either performed or assessed with clinical judgement   ADL Overall ADL's : At baseline;Modified independent                                       General ADL Comments: Able to perform bed mobility, functional transfers, and LB dressing task with modified independence to independence this date. States he feels like all symptoms have resolved. Feels back to his usual state of health.     Vision Baseline Vision/History: 1 Wears glasses Ability to See in Adequate Light: 1 Impaired Patient Visual Report: No change from baseline       Perception         Praxis         Pertinent Vitals/Pain Pain Assessment Pain Assessment: No/denies pain     Extremity/Trunk Assessment Upper Extremity Assessment Upper Extremity Assessment:  Overall [facility] for tasks assessed;Left hand dominant (Reporting symptoms have resolved. BUE grossly symmetrical ROM WNL.)   Lower Extremity Assessment Lower Extremity Assessment: Overall WFL for tasks assessed (Hx of baeline neuropathy.)   Cervical / Trunk Assessment Cervical / Trunk Assessment: Normal   Communication Communication Communication: No apparent difficulties Factors Affecting Communication: Hearing impaired (wears hearing aids.)   Cognition Arousal: Alert Behavior During Therapy: WFL for tasks assessed/performed Cognition: No apparent impairments             OT - Cognition Comments: Dtr present at bedside states he is at baseline. No concerns with  cognition at this time.                 Following commands: Intact       Cueing  General Comments   Cueing Techniques: Verbal cues      Exercises Other Exercises Other Exercises: Pt/caregiver educated on role of OT in acute setting, safety, falls prevention for home and hospital.   Shoulder Instructions      Home Living Family/patient expects to be discharged to:: Private residence Living Arrangements: Children (Dtr) Available Help at Discharge: Family;Available 24 hours/day Type of Home: House Home Access: Stairs to enter [employer] of Steps: 1         Bathroom Shower/Tub: Tub/shower unit         Home Equipment: Agricultural consultant (2 wheels);Rollator (4 wheels);Electric scooter;Grab bars - tub/shower;Shower seat   Additional Comments: BSC over toilet      Prior Functioning/Environment Prior Level of Function : Independent/Modified Independent             Mobility Comments: Primarily uses 4WW for HH distances to/from vehicle. Will use power scooter around his yard, to go down to his mail box, etc. Utilizes power scooters at grocery stores for longer distance amb. ADLs Comments: Generally MOD I with 4WW for mbility, uses shower seat to bathe. Dtr assists with medication management, but otherwise he does all IADLs and drives short distances.    OT Problem List: Decreased strength;Decreased coordination;Impaired balance (sitting and/or standing);Impaired UE functional use   OT Treatment/Interventions:        OT Goals(Current goals can be found in the care plan section)   Acute Rehab OT Goals Patient Stated Goal: To go home OT Goal Formulation: All assessment and education complete, DC therapy Time For Goal Achievement: 06/11/24 Potential to Achieve Goals: Good   OT Frequency:       Co-evaluation              AM-PAC OT 6 Clicks Daily Activity     Outcome Measure Help from another person eating meals?: None Help from another  person taking care of personal grooming?: None Help from another person toileting, which includes using toliet, bedpan, or urinal?: None Help from another person bathing (including washing, rinsing, drying)?: None Help from another person to put on and taking off regular upper body clothing?: None Help from another person to put on and taking off regular lower body clothing?: None 6 Click Score: 24   End of Session Equipment Utilized During Treatment: Rolling walker (2 wheels) Nurse Communication: Mobility status  Activity Tolerance: Patient tolerated treatment well Patient left: in bed;with call bell/phone within reach;with bed alarm set;with family/visitor present  OT Visit Diagnosis: Hemiplegia and hemiparesis;Other symptoms and signs involving the nervous system (R29.898) Hemiplegia - Right/Left: Left Hemiplegia - dominant/non-dominant: Dominant Hemiplegia - caused by: Cerebral infarction  Time: 9166-9151 OT Time Calculation (min): 15 min Charges:  OT General Charges $OT Visit: 1 Visit OT Evaluation $OT Eval Low Complexity: 1 Low  Jhonny Pelton, M.S., OTR/L 06/11/24, 11:44 AM

## 2024-06-11 NOTE — Progress Notes (Signed)
*  PRELIMINARY RESULTS* Echocardiogram 2D Echocardiogram has been performed.  Corey Johnson 06/11/2024, 11:58 AM

## 2024-06-11 NOTE — Plan of Care (Signed)

## 2024-06-11 NOTE — Progress Notes (Signed)
 SLP Cancellation Note  Patient Details Name: Corey Johnson MRN: [id number] DOB: Jul 13, 1941   Cancelled treatment:       Reason Eval/Treat Not Completed: SLP screened, no needs identified, will sign off (chart reviewed; consulted NSG and Neurologist. Pt is room conversing w/ Neuro during this visit.)   Pt sitting in chair in room w/ Dtr. Neurologist arrived to room. Pt followed all instructions and answered all questions appropriately. Pt stated everything is back to normal now.  Dtr endorsed to Neuro that an episode like this has happened before.  Per chart notes, in 2022 patient underwent carotid endarterectomy at [facility] after an acute stroke. Upon arrival to the emergency room patient was back to his baseline with complete resolution of his symptoms..   Pt denied any difficulty swallowing and is currently on a regular diet; tolerates swallowing pills w/ water per NSG. Pt conversed in conversation w/out expressive/receptive deficits noted; pt denied any speech-language deficits. Speech intelligible. No further skilled ST services indicated as pt appears at his baseline. Pt agreed. NSG to reconsult if any change in status while admitted.       Comer Portugal, MS, CCC-SLP Speech Language Pathologist Rehab Services; [facility] [phone] (ascom) Keairra Bardon 06/11/2024, 11:16 AM

## 2024-06-11 NOTE — Procedures (Signed)
 Patient Name: Corey Johnson  MRN: [id number]  Epilepsy Attending: Arlin MALVA Krebs  Referring Physician/Provider: Voncile Isles, MD  Date: 06/11/2024 Duration: 30.39 mins  Patient history: 83yo M with syncope and elft sided weakness. EEG to evaluate for seizure  Level of alertness: Awake, asleep  AEDs during EEG study: None  Technical aspects: This EEG study was done with scalp electrodes positioned according to the 10-20 International system of electrode placement. Electrical activity was reviewed with band pass filter of 1-70Hz , sensitivity of 7 uV/mm, display speed of 7mm/sec with a 60Hz  notched filter applied as appropriate. EEG data were recorded continuously and digitally stored.  Video monitoring was available and reviewed as appropriate.  Description: The posterior dominant rhythm consists of 8-9 Hz activity of moderate voltage (25-35 uV) seen predominantly in posterior head regions, symmetric and reactive to eye opening and eye closing. Sleep was characterized by vertex waves, sleep spindles (12 to 14 Hz), maximal frontocentral region. Hyperventilation and photic stimulation were not performed.     IMPRESSION: This study is within normal limits. No seizures or epileptiform discharges were seen throughout the recording.  A normal interictal EEG does not exclude the diagnosis of epilepsy.   Zykia Walla O Durenda Pechacek

## 2024-06-11 NOTE — Evaluation (Signed)
 Physical Therapy Evaluation Patient Details Name: Corey Johnson MRN: [id number] DOB: 1940-09-10 Today's Date: 06/11/2024  History of Present Illness  Corey Johnson is a 83 y.o. male with medical history significant of hypertension, BPH, gout, hyperlipidemia, OSA, psoriasis, previous stroke, diabetes mellitus type 2. Pt presented to the ED after episode of syncope at urgent care. Prior to this pt had experienced LUE/L sided facial numbness. Pt admitted for TIA work up. MRI significant for moderate cerebellar infarct.  Clinical Impression  Patient resting in bed upon arrival to room; alert and oriented, follows commands and agreeable to participation with session.  Denies pain; endorses all presenting symptoms have fully resolved. Bilat UE/LE strength and ROM grossly symmetrical and WFL; no focal weakness, sensory or coordination deficits appreciated.  Able to complete bed mobility indep; sit/stand, basic transfers and gait (220') without assist device, cga/close sup.  Demonstrates reciprocal stepping pattern with mildly antalgic gait pattern, R genu varus (chronic arthritis); good step height/length and overall gait mechanics. Good walker position, management. Fair/good cadence with good gait confidence. Completes head turns, obstacle negotiation and change of direction without buckling, LOB or safety concern. Feels all symptoms have resolved and he is performing at baseline ability. Daughter, at bedside, in agreement  No acute PT needs identified at this time.  Will complete initial PT orders; please reconsult should needs change.        If plan is discharge home, recommend the following:     Can travel by private vehicle        Equipment Recommendations    Recommendations for Other Services       Functional Status Assessment Patient has not had a recent decline in their functional status     Precautions / Restrictions Precautions Precautions: None      Mobility  Bed Mobility Overal  bed mobility: Modified Independent                  Transfers Overall transfer level: Modified independent Equipment used: Rolling walker (2 wheels)                    Ambulation/Gait Ambulation/Gait assistance: Supervision, Modified independent (Device/Increase time) Gait Distance (Feet): 220 Feet Assistive device: Rolling walker (2 wheels)         General Gait Details: reciprocal stepping pattern with mildly antalgic gait pattern, R genu varus (chronic arthritis); good step height/length and overall gait mechanics.  Good walker position, management.  Fair/good cadence with good gait confidence.  Completes head turns, obstacle negotiation and change of direction without buckling, LOB or safety concern.  Feels all symptoms have resolved and he is performing at baseline ability.  Daughter, at bedside, in agreement  Stairs            Wheelchair Mobility     Tilt Bed    Modified Rankin (Stroke Patients Only)       Balance Overall balance assessment: Modified Independent                                           Pertinent Vitals/Pain Pain Assessment Pain Assessment: No/denies pain    Home Living Family/patient expects to be discharged to:: Private residence Living Arrangements: Children Available Help at Discharge: Family;Available 24 hours/day Type of Home: House     Entrance Stairs-Number of Steps: 1   Home Layout: One level Home Equipment: Agricultural consultant (  2 wheels);Rollator (4 wheels);Electric scooter;Grab bars - tub/shower;Shower seat      Prior Function Prior Level of Function : Independent/Modified Independent             Mobility Comments: Primarily uses 4WW for HH distances to/from vehicle. Will use power scooter around his yard, to go down to his mail box, etc. Utilizes power scooters at grocery stores for longer distance amb.  Denies recent fall history. ADLs Comments: Generally MOD I with 4WW for mbility, uses  shower seat to bathe. Dtr assists with medication management, but otherwise he does all IADLs and drives short distances.     Extremity/Trunk Assessment   Upper Extremity Assessment Upper Extremity Assessment: Overall WFL for tasks assessed    Lower Extremity Assessment Lower Extremity Assessment: Overall WFL for tasks assessed (grossly at least 4/5 throughout; no focal weakness, sensory or coordination deficits)       Communication   Communication Communication: No apparent difficulties    Cognition Arousal: Alert Behavior During Therapy: WFL for tasks assessed/performed   PT - Cognitive impairments: No apparent impairments                         Following commands: Intact       Cueing Cueing Techniques: Verbal cues     General Comments      Exercises     Assessment/Plan    PT Assessment Patient does not need any further PT services  PT Problem List         PT Treatment Interventions      PT Goals (Current goals can be found in the Care Plan section)  Acute Rehab PT Goals Patient Stated Goal: to go home today! PT Goal Formulation: All assessment and education complete, DC therapy Time For Goal Achievement: 06/11/24 Potential to Achieve Goals: Good    Frequency       Co-evaluation               AM-PAC PT 6 Clicks Mobility  Outcome Measure Help needed turning from your back to your side while in a flat bed without using bedrails?: None Help needed moving from lying on your back to sitting on the side of a flat bed without using bedrails?: None Help needed moving to and from a bed to a chair (including a wheelchair)?: None Help needed standing up from a chair using your arms (e.g., wheelchair or bedside chair)?: None Help needed to walk in hospital room?: None Help needed climbing 3-5 steps with a railing? : None 6 Click Score: 24    End of Session   Activity Tolerance: Patient tolerated treatment well Patient left: in bed;with  call bell/phone within reach;with family/visitor present   PT Visit Diagnosis: Other symptoms and signs involving the nervous system (R29.898)    Time: 8565-8555 PT Time Calculation (min) (ACUTE ONLY): 10 min   Charges:   PT Evaluation $PT Eval Low Complexity: 1 Low   PT General Charges $$ ACUTE PT VISIT: 1 Visit        Naveen Lorusso H. Delores, PT, DPT, NCS 06/11/24, 3:29 PM [phone]

## 2024-06-11 NOTE — ED Notes (Signed)
 Transported pt to room 106B with Manuelita EDT and family with pt on CCM. Arrived to the room, used pt call bell to alert staff to his presence. Pt ambulated to bed with 2 stand by assists. Got pt settled in bed that was low and locked with call bell in his hand and family at bedside. Still no staff arrival, so we waited at nurse's station until we were told, his nurse is right around the corner, you can leave. Agreeing that it was safe to do so, we returned to the ED.

## 2024-06-11 NOTE — Progress Notes (Signed)
 Eeg done

## 2024-06-11 NOTE — Care Management Obs Status (Signed)
 MEDICARE OBSERVATION STATUS NOTIFICATION   Patient Details  Name: LON KLIPPEL MRN: [id number] Date of Birth: 01/03/1941   Medicare Observation Status Notification Given:  Chaney BRANDY CHRISTIANE LELON, CMA 06/11/2024, 3:26 PM

## 2024-06-21 ENCOUNTER — Ambulatory Visit (INDEPENDENT_AMBULATORY_CARE_PROVIDER_SITE_OTHER): Payer: Self-pay | Admitting: Internal Medicine

## 2024-06-21 ENCOUNTER — Ambulatory Visit: Payer: Self-pay | Admitting: Internal Medicine

## 2024-06-21 ENCOUNTER — Encounter: Payer: Self-pay | Admitting: Internal Medicine

## 2024-06-21 VITALS — BP 136/72 | HR 75 | Ht 66.0 in | Wt 223.0 lb

## 2024-06-21 DIAGNOSIS — G459 Transient cerebral ischemic attack, unspecified: Secondary | ICD-10-CM | POA: Diagnosis not present

## 2024-06-21 DIAGNOSIS — E782 Mixed hyperlipidemia: Secondary | ICD-10-CM | POA: Diagnosis not present

## 2024-06-21 DIAGNOSIS — N400 Enlarged prostate without lower urinary tract symptoms: Secondary | ICD-10-CM | POA: Diagnosis not present

## 2024-06-21 DIAGNOSIS — E039 Hypothyroidism, unspecified: Secondary | ICD-10-CM | POA: Insufficient documentation

## 2024-06-21 DIAGNOSIS — I10 Essential (primary) hypertension: Secondary | ICD-10-CM

## 2024-06-21 DIAGNOSIS — E559 Vitamin D deficiency, unspecified: Secondary | ICD-10-CM | POA: Diagnosis not present

## 2024-06-21 DIAGNOSIS — Z23 Encounter for immunization: Secondary | ICD-10-CM | POA: Diagnosis not present

## 2024-06-21 DIAGNOSIS — E1169 Type 2 diabetes mellitus with other specified complication: Secondary | ICD-10-CM | POA: Diagnosis not present

## 2024-06-21 DIAGNOSIS — E66812 Obesity, class 2: Secondary | ICD-10-CM | POA: Insufficient documentation

## 2024-06-21 DIAGNOSIS — Z8673 Personal history of transient ischemic attack (TIA), and cerebral infarction without residual deficits: Secondary | ICD-10-CM | POA: Insufficient documentation

## 2024-06-21 DIAGNOSIS — E0849 Diabetes mellitus due to underlying condition with other diabetic neurological complication: Secondary | ICD-10-CM | POA: Insufficient documentation

## 2024-06-21 DIAGNOSIS — K219 Gastro-esophageal reflux disease without esophagitis: Secondary | ICD-10-CM | POA: Diagnosis not present

## 2024-06-21 DIAGNOSIS — M1000 Idiopathic gout, unspecified site: Secondary | ICD-10-CM | POA: Diagnosis not present

## 2024-06-21 DIAGNOSIS — J301 Allergic rhinitis due to pollen: Secondary | ICD-10-CM

## 2024-06-21 DIAGNOSIS — E119 Type 2 diabetes mellitus without complications: Secondary | ICD-10-CM | POA: Insufficient documentation

## 2024-06-21 DIAGNOSIS — L409 Psoriasis, unspecified: Secondary | ICD-10-CM

## 2024-06-21 DIAGNOSIS — G4730 Sleep apnea, unspecified: Secondary | ICD-10-CM

## 2024-06-21 DIAGNOSIS — G629 Polyneuropathy, unspecified: Secondary | ICD-10-CM | POA: Insufficient documentation

## 2024-06-21 LAB — POCT URINALYSIS DIPSTICK
Bilirubin, UA: NEGATIVE
Blood, UA: NEGATIVE
Glucose, UA: NEGATIVE
Ketones, UA: NEGATIVE
Leukocytes, UA: NEGATIVE
Nitrite, UA: NEGATIVE
Protein, UA: NEGATIVE
Spec Grav, UA: 1.015 (ref 1.010–1.025)
Urobilinogen, UA: 0.2 U/dL
pH, UA: 5.5 (ref 5.0–8.0)

## 2024-06-21 LAB — POC CREATINE & ALBUMIN,URINE
Albumin/Creatinine Ratio, Urine, POC: <30
Creatinine, POC: 100 mg/dL
Microalbumin Ur, POC: 80 mg/L

## 2024-06-21 LAB — POCT CBG (FASTING - GLUCOSE)-MANUAL ENTRY: Glucose Fasting, POC: 116 mg/dL — AB (ref 70–99)

## 2024-06-21 MED ORDER — OMEGA-3 FISH OIL 1200 MG PO CAPS: 2.0000 | ORAL_CAPSULE | Freq: Every day | ORAL | 3 refills | Status: AC

## 2024-06-21 MED ORDER — LEVOCETIRIZINE DIHYDROCHLORIDE 5 MG PO TABS: 5.0000 mg | ORAL_TABLET | Freq: Every day | ORAL | 3 refills | Status: AC

## 2024-06-21 MED ORDER — LEVOTHYROXINE SODIUM 50 MCG PO TABS: 50.0000 ug | ORAL_TABLET | Freq: Every day | ORAL | 1 refills | Status: DC

## 2024-06-21 MED ORDER — ATORVASTATIN CALCIUM 80 MG PO TABS: 80.0000 mg | ORAL_TABLET | Freq: Every day | ORAL | 3 refills | Status: AC

## 2024-06-21 MED ORDER — OMEPRAZOLE 20 MG PO CPDR: 20.0000 mg | DELAYED_RELEASE_CAPSULE | Freq: Every day | ORAL | 2 refills | Status: AC | PRN

## 2024-06-21 MED ORDER — RYBELSUS 3 MG PO TABS: 1.0000 | ORAL_TABLET | Freq: Every morning | ORAL | 6 refills | Status: AC

## 2024-06-21 MED ORDER — ENALAPRIL MALEATE 20 MG PO TABS: 20.0000 mg | ORAL_TABLET | Freq: Every day | ORAL | 3 refills | Status: AC

## 2024-06-21 MED ORDER — VITAMIN D3 1.25 MG (50000 UT) PO CAPS: 1.0000 | ORAL_CAPSULE | ORAL | 3 refills | Status: AC

## 2024-06-21 MED ORDER — AMLODIPINE BESYLATE 5 MG PO TABS: 5.0000 mg | ORAL_TABLET | Freq: Every day | ORAL | 3 refills | Status: DC

## 2024-06-21 MED ORDER — GABAPENTIN 100 MG PO CAPS: 100.0000 mg | ORAL_CAPSULE | Freq: Two times a day (BID) | ORAL | 3 refills | Status: AC

## 2024-06-21 MED ORDER — ALLOPURINOL 300 MG PO TABS: 300.0000 mg | ORAL_TABLET | Freq: Every day | ORAL | 3 refills | Status: AC

## 2024-06-21 MED ORDER — HYDROCHLOROTHIAZIDE 25 MG PO TABS: 25.0000 mg | ORAL_TABLET | Freq: Every day | ORAL | 3 refills | Status: DC

## 2024-06-21 NOTE — Progress Notes (Signed)
 New Patient Office Visit  Subjective   Patient ID: Corey Johnson, male    DOB: 1941/07/16  Age: 83 y.o. MRN: [id number]  CC:  Chief Complaint  Patient presents with   Establish Care    NPE    HPI Corey Johnson presents to establish care Previous Primary Care provider/office: Dr. Weyman  he does have additional concerns to discuss today.   Patient is here today to establish care with office. He is accompanied by his daughter. He reports he is doing well today but feels a little tired. Patient has PMH that includes HTN, hyperlipidemia, DM, gout, GERD, BPH, CKD, OSA with CPAP, psoriasis, hx of stroke 2022, s/p Left Carotid Endartrectomy ,chronic hip pain, diverticulosis, HOH with use of bilateral hearing aids. He is requesting refills on all his medications as he is almost out of his current prescriptions. Patient reports he never started Farxiga as it was too expensive; will provide patient with patient assistance paperwork and submit it for patient. Patient also given Farxiga 10 mg samples.  Patient was recently in hospital from 06/10/24-06/11/24 for TIA. He had stroke in 2022 with mild deficits. He has a rollator today to help with ambulation. Patient reports no difficulty walking but states he has chronic back, hip, knee pain. He had left knee replacement but has not had right knee replaced as he had stroke and does not qualify for surgery. Recommend patient have physical therapy to improve gait and strength training; will send referral. Hospital sent referral for neurology on 06/11/24 but patient has not been scheduled to see them at this time. Daughter reports she will call them to get his consult scheduled. Patient was started on Plavix for 30 days and encouraged to continue his aspirin therapy at time of discharge. Will check basic labs today as well as UA and UAC.  Patient needs referral for Cardiology as he is not established with a specialist at this time. Echo and carotid US   completed during hospitalization. Carotid arteries have less than 50 percent plaque build up. Daughter is requesting referral to prior vascular surgeon at [facility];  referral sent.  Patient states he has numbness in his feet in the evenings after taking his shoes off and gets into bed. Patient is on Gabapentin twice daily and states it helps with his chronic pain and numbness in his feet. Patient endorses checking his feet daily without concerns. Patient also states its been almost 2 years since his last eye exam. Referral sent to have diabetic eye exam completed.  Would like a flu shot today. Patient reports recently have pneumonia vaccines completed.    Outpatient Encounter Medications as of 06/21/2024  Medication Sig   acetaminophen  (TYLENOL ) 500 MG tablet Take 1,000 mg by mouth every 6 (six) hours as needed for mild pain or moderate pain.   aspirin 81 MG chewable tablet Chew 81 mg by mouth daily.   Cholecalciferol  (VITAMIN D3) 1.25 MG (50000 UT) CAPS Take 1 capsule (1.25 mg total) by mouth once a week.   clopidogrel (PLAVIX) 75 MG tablet Take 1 tablet (75 mg total) by mouth daily for 21 days.   rOPINIRole  (REQUIP ) 1 MG tablet Take 1 mg by mouth every evening.   [DISCONTINUED] allopurinol  (ZYLOPRIM ) 300 MG tablet Take by mouth.   [DISCONTINUED] amLODipine  (NORVASC ) 5 MG tablet Take 5 mg by mouth at bedtime.   [DISCONTINUED] atorvastatin (LIPITOR) 80 MG tablet Take 80 mg by mouth daily.   [DISCONTINUED] Cholecalciferol  1000 UNITS tablet Take 5,000  Units by mouth daily.   [DISCONTINUED] enalapril  (VASOTEC ) 20 MG tablet Take 20 mg by mouth daily.   [DISCONTINUED] gabapentin (NEURONTIN) 100 MG capsule Take 100 mg by mouth 2 (two) times daily.   [DISCONTINUED] hydrochlorothiazide  (HYDRODIURIL ) 25 MG tablet Take 25 mg by mouth daily.   [DISCONTINUED] levocetirizine (XYZAL) 5 MG tablet Take 5 mg by mouth daily.   [DISCONTINUED] levothyroxine  (SYNTHROID , LEVOTHROID) 50 MCG tablet Take 1 tablet (50 mcg  total) by mouth daily before breakfast.   [DISCONTINUED] omeprazole (PRILOSEC) 20 MG capsule Take 20 mg by mouth daily as needed (for heartburn/indigestion).    [DISCONTINUED] RYBELSUS 3 MG TABS Take 1 tablet by mouth every morning.   allopurinol  (ZYLOPRIM ) 300 MG tablet Take 1 tablet (300 mg total) by mouth daily.   alum & mag hydroxide-simeth (GELUSIL) 200-200-25 MG suspension Chew 1 tablet by mouth 3 (three) times daily as needed for indigestion, heartburn or flatulence. (Patient not taking: Reported on 06/21/2024)   amLODipine  (NORVASC ) 5 MG tablet Take 1 tablet (5 mg total) by mouth at bedtime.   atorvastatin (LIPITOR) 80 MG tablet Take 1 tablet (80 mg total) by mouth daily.   azithromycin  (ZITHROMAX  Z-PAK) 250 MG tablet 2 today, then one qd x 4 days (Patient not taking: Reported on 06/21/2024)   bisacodyl  (DULCOLAX) 10 MG suppository Place 1 suppository (10 mg total) rectally daily as needed for moderate constipation. (Patient not taking: Reported on 06/21/2024)   enalapril  (VASOTEC ) 20 MG tablet Take 1 tablet (20 mg total) by mouth daily.   gabapentin (NEURONTIN) 100 MG capsule Take 1 capsule (100 mg total) by mouth 2 (two) times daily.   hydrochlorothiazide  (HYDRODIURIL ) 25 MG tablet Take 1 tablet (25 mg total) by mouth daily.   levocetirizine (XYZAL) 5 MG tablet Take 1 tablet (5 mg total) by mouth daily.   levothyroxine  (SYNTHROID ) 50 MCG tablet Take 1 tablet (50 mcg total) by mouth daily before breakfast.   Omega-3 Fatty Acids (OMEGA-3 FISH OIL) 1200 MG CAPS Take 2 capsules (2,400 mg total) by mouth daily.   omeprazole (PRILOSEC) 20 MG capsule Take 1 capsule (20 mg total) by mouth daily as needed (for heartburn/indigestion).   RYBELSUS 3 MG TABS Take 1 tablet (3 mg total) by mouth every morning.   [DISCONTINUED] Omega-3 Fatty Acids (OMEGA-3 FISH OIL) 1200 MG CAPS Take 2 capsules by mouth daily.   No facility-administered encounter medications on file as of 06/21/2024.    Past Medical  History:  Diagnosis Date   Arthritis    BPH (benign prostatic hyperplasia)    CRF (chronic renal failure)    Gout    H. pylori infection    Hyperlipemia    Hypertension    Psoriasis    Renal disorder    Renal insufficiency    Sleep apnea    CPAP   Type 2 diabetes mellitus without complication, without long-term current use of insulin (HCC) 06/21/2024   Vitamin D  deficiency     Past Surgical History:  Procedure Laterality Date   CHOLECYSTECTOMY N/A 02/06/2015   Procedure: LAPAROSCOPIC CHOLECYSTECTOMY WITH INTRAOPERATIVE CHOLANGIOGRAM;  Surgeon: [name], MD;  Location: [facility] ORS;  Service: General;  Laterality: N/A;   COLONOSCOPY WITH PROPOFOL  N/A 06/13/2015   Procedure: COLONOSCOPY WITH PROPOFOL ;  Surgeon: [name], MD;  Location: [facility];  Service: Endoscopy;  Laterality: N/A;   CYSTOSCOPY N/A 04/27/2015   Procedure: [name], CLOT EVACUATION ;  Surgeon: [name], MD;  Location: [facility];  Service: Urology;  Laterality: N/A;   GREEN LIGHT LASER TURP (TRANSURETHRAL RESECTION OF PROSTATE N/A 03/28/2015   Procedure: GREEN LIGHT LASER TURP (TRANSURETHRAL RESECTION OF PROSTATE;  Surgeon: [name], MD;  Location: [facility];  Service: Urology;  Laterality: N/A;   JOINT REPLACEMENT     left knee   KNEE ARTHROSCOPY     REPLACEMENT TOTAL KNEE     SHOULDER SURGERY Right    TRANSURETHRAL RESECTION OF PROSTATE      Family History  Problem Relation Age of Onset   Hypertension Mother    Pneumonia Father     Social History   Socioeconomic History   Marital status: Married    Spouse name: Not on file   Number of children: Not on file   Years of education: Not on file   Highest education level: Not on file  Occupational History   Not on file  Tobacco Use   Smoking status: Former    Types: Cigarettes   Smokeless tobacco: Never  Substance and Sexual Activity   Alcohol use: No    Alcohol/week: 0.0 standard drinks of alcohol   Drug use: No    Sexual activity: Not on file  Other Topics Concern   Not on file  Social History Narrative   Not on file   Social Drivers of Health   Financial Resource Strain: Low Risk  (06/22/2021)   Received [facility]   Overall Financial Resource Strain (CARDIA)    Difficulty of Paying Living Expenses: Not very hard  Food Insecurity: No Food Insecurity (06/11/2024)   Hunger Vital Sign    Worried About Running Out of Food in the Last Year: Never true    Ran Out of Food in the Last Year: Never true  Transportation Needs: No Transportation Needs (06/11/2024)   PRAPARE - Administrator, Civil Service (Medical): No    Lack of Transportation (Non-Medical): No  Physical Activity: Not on file  Stress: Not on file  Social Connections: Moderately Isolated (06/11/2024)   Social Connection and Isolation Panel    Frequency of Communication with Friends and Family: More than three times a week    Frequency of Social Gatherings with Friends and Family: Three times a week    Attends Religious Services: More than 4 times per year    Active Member of Clubs or Organizations: No    Attends Banker Meetings: Never    Marital Status: Widowed  Intimate Partner Violence: Not At Risk (06/11/2024)   Humiliation, Afraid, Rape, and Kick questionnaire    Fear of Current or Ex-Partner: No    Emotionally Abused: No    Physically Abused: No    Sexually Abused: No    Review of Systems  Constitutional: Negative.  Negative for chills, fever and malaise/fatigue.  HENT: Negative.  Negative for congestion and sore throat.   Eyes: Negative.  Negative for blurred vision and pain.  Respiratory: Negative.  Negative for cough and shortness of breath.   Cardiovascular: Negative.  Negative for chest pain, palpitations and leg swelling.  Gastrointestinal: Negative.  Negative for abdominal pain, blood in stool, constipation, diarrhea, heartburn, melena, nausea and vomiting.  Genitourinary:  Negative.  Negative for dysuria, flank pain, frequency and urgency.  Musculoskeletal: Negative.  Negative for joint pain and myalgias.  Skin: Negative.   Neurological: Negative.  Negative for dizziness, tingling, sensory change, speech change, focal weakness, seizures, weakness and headaches.  Endo/Heme/Allergies: Negative.   Psychiatric/Behavioral: Negative.  Negative for depression and  suicidal ideas. The patient is not nervous/anxious.         Objective   BP 136/72   Pulse 75   Ht 5' 6 (1.676 m)   Wt 223 lb (101.2 kg)   SpO2 99%   BMI 35.99 kg/m   Physical Exam Vitals and nursing note reviewed.  Constitutional:      General: He is not in acute distress.    Appearance: Normal appearance. He is not ill-appearing.  HENT:     Head: Normocephalic and atraumatic.     Nose: Nose normal.     Mouth/Throat:     Mouth: Mucous membranes are moist.     Pharynx: Oropharynx is clear.  Eyes:     Conjunctiva/sclera: Conjunctivae normal.     Pupils: Pupils are equal, round, and reactive to light.  Cardiovascular:     Rate and Rhythm: Normal rate and regular rhythm.     Pulses: Normal pulses.     Heart sounds: Normal heart sounds.  Pulmonary:     Effort: Pulmonary effort is normal.     Breath sounds: Normal breath sounds. No wheezing or rhonchi.  Abdominal:     General: Bowel sounds are normal. There is no distension.     Palpations: Abdomen is soft.     Tenderness: There is no abdominal tenderness.  Musculoskeletal:        General: Normal range of motion.     Cervical back: Normal range of motion and neck supple.     Right lower leg: No edema.     Left lower leg: No edema.  Skin:    General: Skin is warm and dry.     Capillary Refill: Capillary refill takes less than 2 seconds.  Neurological:     General: No focal deficit present.     Mental Status: He is alert and oriented to person, place, and time.     Sensory: No sensory deficit.     Motor: No weakness.  Psychiatric:         Mood and Affect: Mood normal.        Behavior: Behavior normal.        Judgment: Judgment normal.        Assessment & Plan:  Continue taking medications as prescribed. Refills sent. Referral to Ophthalmology, Vascular, PT, Cardiology sent. Routine labs collected and FU with patient on results. Check UA and UAC today. Flu shot given. Problem List Items Addressed This Visit     Gout (Chronic)   Relevant Medications   allopurinol  (ZYLOPRIM ) 300 MG tablet   Other Relevant Orders   Ambulatory referral to Vascular Surgery   Uric acid   HTN (hypertension) (Chronic)   Relevant Medications   amLODipine  (NORVASC ) 5 MG tablet   atorvastatin (LIPITOR) 80 MG tablet   enalapril  (VASOTEC ) 20 MG tablet   hydrochlorothiazide  (HYDRODIURIL ) 25 MG tablet   Other Relevant Orders   Ambulatory referral to Cardiology   POCT Urinalysis Dipstick (18997) (Completed)   Psoriasis (Chronic)   BPH (benign prostatic hyperplasia) (Chronic)   Allergic rhinitis (Chronic)   Relevant Medications   levocetirizine (XYZAL) 5 MG tablet   GERD (gastroesophageal reflux disease) (Chronic)   Relevant Medications   omeprazole (PRILOSEC) 20 MG capsule   Sleep apnea (Chronic)   Transient ischemic attack   Relevant Medications   amLODipine  (NORVASC ) 5 MG tablet   atorvastatin (LIPITOR) 80 MG tablet   enalapril  (VASOTEC ) 20 MG tablet   hydrochlorothiazide  (HYDRODIURIL ) 25 MG tablet   Other  Relevant Orders   CBC with Diff   CMP14+EGFR   Ambulatory referral to Physical Therapy   Ambulatory referral to Cardiology   Type 2 diabetes mellitus without complication, without long-term current use of insulin (HCC) - Primary   Relevant Medications   atorvastatin (LIPITOR) 80 MG tablet   enalapril  (VASOTEC ) 20 MG tablet   RYBELSUS 3 MG TABS   Other Relevant Orders   POCT CBG (Fasting - Glucose) (Completed)   CBC with Diff   CMP14+EGFR   Ambulatory referral to Ophthalmology   POC CREATINE & ALBUMIN,URINE  (Completed)   Combined hyperlipidemia associated with type 2 diabetes mellitus (HCC)   Relevant Medications   amLODipine  (NORVASC ) 5 MG tablet   atorvastatin (LIPITOR) 80 MG tablet   enalapril  (VASOTEC ) 20 MG tablet   hydrochlorothiazide  (HYDRODIURIL ) 25 MG tablet   Omega-3 Fatty Acids (OMEGA-3 FISH OIL) 1200 MG CAPS   RYBELSUS 3 MG TABS   Vitamin D  deficiency   Relevant Medications   Cholecalciferol  (VITAMIN D3) 1.25 MG (50000 UT) CAPS   Other Relevant Orders   Vitamin D  (25 hydroxy)   Acquired hypothyroidism   Relevant Medications   levothyroxine  (SYNTHROID ) 50 MCG tablet   Other Relevant Orders   TSH+T4F+T3Free   Neuropathy   Relevant Medications   gabapentin (NEURONTIN) 100 MG capsule   Flu vaccine need   Relevant Orders   Flu Vaccine Trivalent High Dose (Fluad) (Completed)   Obesity, Class II, BMI 35-39.9    Return in about 1 month (around 07/22/2024).   Total time spent: 45 minutes  FERNAND FREDY RAMAN, MD  06/21/2024   This document may have been prepared by [facility] Voice Recognition software and as such may include unintentional dictation errors.

## 2024-06-22 LAB — CMP14+EGFR
ALT: 13 IU/L (ref 0–44)
AST: 13 IU/L (ref 0–40)
Albumin: 4.5 g/dL (ref 3.7–4.7)
Alkaline Phosphatase: 134 IU/L — ABNORMAL HIGH (ref 48–129)
BUN/Creatinine Ratio: 18 (ref 10–24)
BUN: 26 mg/dL (ref 8–27)
Bilirubin Total: 0.4 mg/dL (ref 0.0–1.2)
CO2: 23 mmol/L (ref 20–29)
Calcium: 10.1 mg/dL (ref 8.6–10.2)
Chloride: 104 mmol/L (ref 96–106)
Creatinine, Ser: 1.44 mg/dL — ABNORMAL HIGH (ref 0.76–1.27)
Globulin, Total: 2.5 g/dL (ref 1.5–4.5)
Glucose: 93 mg/dL (ref 70–99)
Potassium: 5 mmol/L (ref 3.5–5.2)
Sodium: 143 mmol/L (ref 134–144)
Total Protein: 7 g/dL (ref 6.0–8.5)
eGFR: 48 mL/min/1.73 — ABNORMAL LOW (ref >59)

## 2024-06-22 LAB — CBC WITH DIFFERENTIAL/PLATELET
Basophils Absolute: 0.1 x10E3/uL (ref 0.0–0.2)
Basos: 1 %
EOS (ABSOLUTE): 0.4 x10E3/uL (ref 0.0–0.4)
Eos: 4 %
Hematocrit: 42.1 % (ref 37.5–51.0)
Hemoglobin: 13.2 g/dL (ref 13.0–17.7)
Immature Grans (Abs): 0 x10E3/uL (ref 0.0–0.1)
Immature Granulocytes: 0 %
Lymphocytes Absolute: 2.9 x10E3/uL (ref 0.7–3.1)
Lymphs: 31 %
MCH: 27.6 pg (ref 26.6–33.0)
MCHC: 31.4 g/dL — ABNORMAL LOW (ref 31.5–35.7)
MCV: 88 fL (ref 79–97)
Monocytes Absolute: 0.7 x10E3/uL (ref 0.1–0.9)
Monocytes: 8 %
Neutrophils Absolute: 5.2 x10E3/uL (ref 1.4–7.0)
Neutrophils: 56 %
Platelets: 338 x10E3/uL (ref 150–450)
RBC: 4.78 x10E6/uL (ref 4.14–5.80)
RDW: 14.4 % (ref 11.6–15.4)
WBC: 9.3 x10E3/uL (ref 3.4–10.8)

## 2024-06-22 LAB — TSH+T4F+T3FREE
Free T4: 1.38 ng/dL (ref 0.82–1.77)
T3, Free: 3.1 pg/mL (ref 2.0–4.4)
TSH: 1.98 u[IU]/mL (ref 0.450–4.500)

## 2024-06-22 LAB — VITAMIN D 25 HYDROXY (VIT D DEFICIENCY, FRACTURES): Vit D, 25-Hydroxy: 78.8 ng/mL (ref 30.0–100.0)

## 2024-06-22 LAB — URIC ACID: Uric Acid: 3.9 mg/dL (ref 3.8–8.4)

## 2024-06-28 ENCOUNTER — Telehealth: Payer: Self-pay

## 2024-06-28 ENCOUNTER — Other Ambulatory Visit: Payer: Self-pay | Admitting: Internal Medicine

## 2024-06-28 DIAGNOSIS — E119 Type 2 diabetes mellitus without complications: Secondary | ICD-10-CM

## 2024-06-28 MED ORDER — ACCU-CHEK GUIDE TEST VI STRP
ORAL_STRIP | 12 refills | Status: AC
Start: 2024-06-28 — End: ?

## 2024-06-28 NOTE — Telephone Encounter (Signed)
 Patients daughter called asking if we could send in AccuChek Guide Test strips for the patient to [facility] for 90 days supply

## 2024-06-30 DIAGNOSIS — I6521 Occlusion and stenosis of right carotid artery: Secondary | ICD-10-CM | POA: Diagnosis not present

## 2024-06-30 DIAGNOSIS — I639 Cerebral infarction, unspecified: Secondary | ICD-10-CM | POA: Diagnosis not present

## 2024-06-30 DIAGNOSIS — E119 Type 2 diabetes mellitus without complications: Secondary | ICD-10-CM | POA: Diagnosis not present

## 2024-06-30 DIAGNOSIS — I10 Essential (primary) hypertension: Secondary | ICD-10-CM | POA: Diagnosis not present

## 2024-06-30 DIAGNOSIS — E785 Hyperlipidemia, unspecified: Secondary | ICD-10-CM | POA: Diagnosis not present

## 2024-06-30 DIAGNOSIS — I779 Disorder of arteries and arterioles, unspecified: Secondary | ICD-10-CM | POA: Diagnosis not present

## 2024-07-01 DIAGNOSIS — H538 Other visual disturbances: Secondary | ICD-10-CM | POA: Diagnosis not present

## 2024-07-01 DIAGNOSIS — H43813 Vitreous degeneration, bilateral: Secondary | ICD-10-CM | POA: Diagnosis not present

## 2024-07-01 DIAGNOSIS — E119 Type 2 diabetes mellitus without complications: Secondary | ICD-10-CM | POA: Diagnosis not present

## 2024-07-01 DIAGNOSIS — H2513 Age-related nuclear cataract, bilateral: Secondary | ICD-10-CM | POA: Diagnosis not present

## 2024-07-09 DIAGNOSIS — R55 Syncope and collapse: Secondary | ICD-10-CM | POA: Diagnosis not present

## 2024-07-09 DIAGNOSIS — G459 Transient cerebral ischemic attack, unspecified: Secondary | ICD-10-CM | POA: Diagnosis not present

## 2024-07-12 ENCOUNTER — Ambulatory Visit: Admitting: Cardiovascular Disease

## 2024-07-12 ENCOUNTER — Encounter: Payer: Self-pay | Admitting: Cardiovascular Disease

## 2024-07-12 VITALS — BP 112/58 | HR 61 | Ht 66.0 in | Wt 224.0 lb

## 2024-07-12 DIAGNOSIS — G4730 Sleep apnea, unspecified: Secondary | ICD-10-CM

## 2024-07-12 DIAGNOSIS — R55 Syncope and collapse: Secondary | ICD-10-CM

## 2024-07-12 DIAGNOSIS — E782 Mixed hyperlipidemia: Secondary | ICD-10-CM | POA: Diagnosis not present

## 2024-07-12 DIAGNOSIS — G459 Transient cerebral ischemic attack, unspecified: Secondary | ICD-10-CM | POA: Diagnosis not present

## 2024-07-12 DIAGNOSIS — R42 Dizziness and giddiness: Secondary | ICD-10-CM

## 2024-07-12 DIAGNOSIS — I10 Essential (primary) hypertension: Secondary | ICD-10-CM

## 2024-07-12 DIAGNOSIS — E1165 Type 2 diabetes mellitus with hyperglycemia: Secondary | ICD-10-CM

## 2024-07-12 DIAGNOSIS — E1169 Type 2 diabetes mellitus with other specified complication: Secondary | ICD-10-CM | POA: Diagnosis not present

## 2024-07-12 DIAGNOSIS — M1000 Idiopathic gout, unspecified site: Secondary | ICD-10-CM | POA: Diagnosis not present

## 2024-07-12 DIAGNOSIS — I5033 Acute on chronic diastolic (congestive) heart failure: Secondary | ICD-10-CM

## 2024-07-12 DIAGNOSIS — E119 Type 2 diabetes mellitus without complications: Secondary | ICD-10-CM

## 2024-07-12 MED ORDER — FUROSEMIDE 20 MG PO TABS: 20.0000 mg | ORAL_TABLET | Freq: Every day | ORAL | 11 refills | Status: DC

## 2024-07-12 NOTE — Progress Notes (Signed)
 Cardiology Office Note   Date:  07/12/2024   ID:  Corey Johnson, Corey Johnson Jul 10, 1941, MRN [id number]  PCP:  Fernand Fredy RAMAN, MD  Cardiologist:  Denyse Fernand, MD      History of Present Illness: Corey Johnson is a 83 y.o. male who presents for  Chief Complaint  Patient presents with   Consult    Consult EKG done 10/3 In ED    [id number] presents for evaluation as has SOB, fatigue, and recently had TIA, and syncope on 06/10/24. He was admitted and w/u was unremarkable for neurology and vascular, thus sent  to RO CAD. Has h/o CEA left side , thus risk of CAD high.      Past Medical History:  Diagnosis Date   Arthritis    BPH (benign prostatic hyperplasia)    CRF (chronic renal failure)    Gout    H. pylori infection    Hyperlipemia    Hypertension    Psoriasis    Renal disorder    Renal insufficiency    Sleep apnea    CPAP   Type 2 diabetes mellitus without complication, without long-term current use of insulin (HCC) 06/21/2024   Vitamin D  deficiency      Past Surgical History:  Procedure Laterality Date   CHOLECYSTECTOMY N/A 02/06/2015   Procedure: LAPAROSCOPIC CHOLECYSTECTOMY WITH INTRAOPERATIVE CHOLANGIOGRAM;  Surgeon: [name], MD;  Location: [facility];  Service: General;  Laterality: N/A;   COLONOSCOPY WITH PROPOFOL  N/A 06/13/2015   Procedure: COLONOSCOPY WITH PROPOFOL ;  Surgeon: [name], MD;  Location: [facility];  Service: Endoscopy;  Laterality: N/A;   CYSTOSCOPY N/A 04/27/2015   Procedure: [name], CLOT EVACUATION ;  Surgeon: [name], MD;  Location: [facility];  Service: Urology;  Laterality: N/A;   GREEN LIGHT LASER TURP (TRANSURETHRAL RESECTION OF PROSTATE N/A 03/28/2015   Procedure: GREEN LIGHT LASER TURP (TRANSURETHRAL RESECTION OF PROSTATE;  Surgeon: [name], MD;  Location: [facility];  Service: Urology;  Laterality: N/A;   JOINT REPLACEMENT     left knee   KNEE ARTHROSCOPY     REPLACEMENT TOTAL KNEE     SHOULDER SURGERY  Right    TRANSURETHRAL RESECTION OF PROSTATE       Current Outpatient Medications  Medication Sig Dispense Refill   furosemide (LASIX) 20 MG tablet Take 1 tablet (20 mg total) by mouth daily. 30 tablet 11   acetaminophen  (TYLENOL ) 500 MG tablet Take 1,000 mg by mouth every 6 (six) hours as needed for mild pain or moderate pain.     allopurinol  (ZYLOPRIM ) 300 MG tablet Take 1 tablet (300 mg total) by mouth daily. 90 tablet 3   amLODipine  (NORVASC ) 5 MG tablet Take 1 tablet (5 mg total) by mouth at bedtime. 90 tablet 3   aspirin 81 MG chewable tablet Chew 81 mg by mouth daily.     atorvastatin (LIPITOR) 80 MG tablet Take 1 tablet (80 mg total) by mouth daily. 90 tablet 3   azithromycin  (ZITHROMAX  Z-PAK) 250 MG tablet 2 today, then one qd x 4 days (Patient not taking: Reported on 06/21/2024) 6 tablet 0   bisacodyl  (DULCOLAX) 10 MG suppository Place 1 suppository (10 mg total) rectally daily as needed for moderate constipation. (Patient not taking: Reported on 06/21/2024) 12 suppository 0   Cholecalciferol  (VITAMIN D3) 1.25 MG (50000 UT) CAPS Take 1 capsule (1.25 mg total) by mouth once a week. 12 capsule 3   enalapril  (VASOTEC ) 20 MG tablet  Take 1 tablet (20 mg total) by mouth daily. 90 tablet 3   gabapentin (NEURONTIN) 100 MG capsule Take 1 capsule (100 mg total) by mouth 2 (two) times daily. 180 capsule 3   glucose blood (ACCU-CHEK GUIDE TEST) test strip Use as instructed 100 each 12   levocetirizine (XYZAL) 5 MG tablet Take 1 tablet (5 mg total) by mouth daily. 90 tablet 3   levothyroxine  (SYNTHROID ) 50 MCG tablet Take 1 tablet (50 mcg total) by mouth daily before breakfast. 30 tablet 1   Omega-3 Fatty Acids (OMEGA-3 FISH OIL) 1200 MG CAPS Take 2 capsules (2,400 mg total) by mouth daily. 180 capsule 3   omeprazole (PRILOSEC) 20 MG capsule Take 1 capsule (20 mg total) by mouth daily as needed (for heartburn/indigestion). 30 capsule 2   rOPINIRole  (REQUIP ) 1 MG tablet Take 1 mg by mouth every  evening.     RYBELSUS 3 MG TABS Take 1 tablet (3 mg total) by mouth every morning. 30 tablet 6   No current facility-administered medications for this visit.    Allergies:   Lisinopril, Metoprolol, Tapentadol , Tramadol, Nsaids, and Simvastatin    Social History:   reports that he has quit smoking. His smoking use included cigarettes. He has never used smokeless tobacco. He reports that he does not drink alcohol and does not use drugs.   Family History:  family history includes Hypertension in his mother; Pneumonia in his father.    ROS:     Review of Systems  Constitutional: Negative.   HENT: Negative.    Eyes: Negative.   Respiratory: Negative.    Gastrointestinal: Negative.   Genitourinary: Negative.   Musculoskeletal: Negative.   Skin: Negative.   Neurological: Negative.   Endo/Heme/Allergies: Negative.   Psychiatric/Behavioral: Negative.    All other systems reviewed and are negative.     All other systems are reviewed and negative.    PHYSICAL EXAM: VS:  BP (!) 112/58   Pulse 61   Ht 5' 6 (1.676 m)   Wt 224 lb (101.6 kg)   SpO2 94%   BMI 36.15 kg/m  , BMI Body mass index is 36.15 kg/m. Last weight:  Wt Readings from Last 3 Encounters:  07/12/24 224 lb (101.6 kg)  06/21/24 223 lb (101.2 kg)  06/11/24 219 lb 9.3 oz (99.6 kg)     Physical Exam Vitals reviewed.  Constitutional:      Appearance: Normal appearance. He is normal weight.  HENT:     Head: Normocephalic.     Nose: Nose normal.     Mouth/Throat:     Mouth: Mucous membranes are moist.  Eyes:     Pupils: Pupils are equal, round, and reactive to light.  Cardiovascular:     Rate and Rhythm: Normal rate and regular rhythm.     Pulses: Normal pulses.     Heart sounds: Normal heart sounds.  Pulmonary:     Effort: Pulmonary effort is normal.  Abdominal:     General: Abdomen is flat. Bowel sounds are normal.  Musculoskeletal:        General: Normal range of motion.     Cervical back: Normal  range of motion.  Skin:    General: Skin is warm.  Neurological:     General: No focal deficit present.     Mental Status: He is alert.  Psychiatric:        Mood and Affect: Mood normal.       EKG: NSR 60/min no acute changes  Recent Labs: 06/21/2024: ALT 13; BUN 26; Creatinine, Ser 1.44; Hemoglobin 13.2; Platelets 338; Potassium 5.0; Sodium 143; TSH 1.980    Lipid Panel    Component Value Date/Time   CHOL 103 06/11/2024 0333   TRIG 75 06/11/2024 0333   HDL 36 (L) 06/11/2024 0333   CHOLHDL 2.9 06/11/2024 0333   VLDL 15 06/11/2024 0333   LDLCALC 52 06/11/2024 0333      Other studies Reviewed: Additional studies/ records that were reviewed today include:  Review of the above records demonstrates:       No data to display            ASSESSMENT AND PLAN:    ICD-10-CM   1. Syncope, unspecified syncope type  R55 MYOCARDIAL PERFUSION IMAGING    CARDIAC EVENT MONITOR    furosemide (LASIX) 20 MG tablet   Stress test and holter t/o r/o CAD and atrial fib from holter.    2. Transient ischemic attack  G45.9 MYOCARDIAL PERFUSION IMAGING    CARDIAC EVENT MONITOR    furosemide (LASIX) 20 MG tablet    3. Combined hyperlipidemia associated with type 2 diabetes mellitus (HCC)  E11.69 MYOCARDIAL PERFUSION IMAGING   E78.2 CARDIAC EVENT MONITOR    furosemide (LASIX) 20 MG tablet    4. Sleep apnea, unspecified type  G47.30 MYOCARDIAL PERFUSION IMAGING    CARDIAC EVENT MONITOR    furosemide (LASIX) 20 MG tablet    5. Primary hypertension  I10 MYOCARDIAL PERFUSION IMAGING    CARDIAC EVENT MONITOR    furosemide (LASIX) 20 MG tablet    6. Type 2 diabetes mellitus without complication, without long-term current use of insulin (HCC)  E11.9 MYOCARDIAL PERFUSION IMAGING    CARDIAC EVENT MONITOR    furosemide (LASIX) 20 MG tablet    7. Dizziness  R42 MYOCARDIAL PERFUSION IMAGING    CARDIAC EVENT MONITOR    furosemide (LASIX) 20 MG tablet    8. Idiopathic gout, unspecified  chronicity, unspecified site  M10.00 furosemide (LASIX) 20 MG tablet   Stop HCTZ as has gout and can cause worsening of uric acid.    9. CHF (congestive heart failure), NYHA class III, acute on chronic, diastolic (HCC)  I50.33    LVEF 65% in [facility] echo, but had grade 1 diastolic dysfunction, already opn farxiga, will change HCTZ to lasix as has gout.       Problem List Items Addressed This Visit       Cardiovascular and Mediastinum   HTN (hypertension) (Chronic)   Relevant Medications   furosemide (LASIX) 20 MG tablet   Other Relevant Orders   MYOCARDIAL PERFUSION IMAGING   CARDIAC EVENT MONITOR   Transient ischemic attack   Relevant Medications   furosemide (LASIX) 20 MG tablet   Other Relevant Orders   MYOCARDIAL PERFUSION IMAGING   CARDIAC EVENT MONITOR     Respiratory   Sleep apnea (Chronic)   Relevant Medications   furosemide (LASIX) 20 MG tablet   Other Relevant Orders   MYOCARDIAL PERFUSION IMAGING   CARDIAC EVENT MONITOR     Endocrine   Type 2 diabetes mellitus without complication, without long-term current use of insulin (HCC)   Relevant Medications   furosemide (LASIX) 20 MG tablet   Other Relevant Orders   MYOCARDIAL PERFUSION IMAGING   CARDIAC EVENT MONITOR   Combined hyperlipidemia associated with type 2 diabetes mellitus (HCC)   Relevant Medications   furosemide (LASIX) 20 MG tablet   Other Relevant Orders   MYOCARDIAL PERFUSION  IMAGING   CARDIAC EVENT MONITOR     Other   Gout (Chronic)   Relevant Medications   furosemide (LASIX) 20 MG tablet   Other Visit Diagnoses       Syncope, unspecified syncope type    -  Primary   Stress test and holter t/o r/o CAD and atrial fib from holter.   Relevant Medications   furosemide (LASIX) 20 MG tablet   Other Relevant Orders   MYOCARDIAL PERFUSION IMAGING   CARDIAC EVENT MONITOR     Dizziness       Relevant Medications   furosemide (LASIX) 20 MG tablet   Other Relevant Orders   MYOCARDIAL PERFUSION  IMAGING   CARDIAC EVENT MONITOR     CHF (congestive heart failure), NYHA class III, acute on chronic, diastolic (HCC)       LVEF 65% in [place] echo, but had grade 1 diastolic dysfunction, already opn farxiga, will change HCTZ to lasix as has gout.   Relevant Medications   furosemide (LASIX) 20 MG tablet          Disposition:   Return for stress test and Holter and f/u.    Total time spent: 40 minutes  Signed,  Denyse Bathe, MD  07/12/2024 10:50 AM    [facility]

## 2024-07-14 ENCOUNTER — Encounter: Payer: Self-pay | Admitting: Internal Medicine

## 2024-07-19 ENCOUNTER — Ambulatory Visit (INDEPENDENT_AMBULATORY_CARE_PROVIDER_SITE_OTHER)

## 2024-07-19 DIAGNOSIS — I10 Essential (primary) hypertension: Secondary | ICD-10-CM

## 2024-07-19 DIAGNOSIS — R55 Syncope and collapse: Secondary | ICD-10-CM

## 2024-07-19 DIAGNOSIS — R42 Dizziness and giddiness: Secondary | ICD-10-CM

## 2024-07-19 DIAGNOSIS — G4730 Sleep apnea, unspecified: Secondary | ICD-10-CM

## 2024-07-19 DIAGNOSIS — E119 Type 2 diabetes mellitus without complications: Secondary | ICD-10-CM

## 2024-07-19 DIAGNOSIS — E1169 Type 2 diabetes mellitus with other specified complication: Secondary | ICD-10-CM

## 2024-07-19 DIAGNOSIS — G459 Transient cerebral ischemic attack, unspecified: Secondary | ICD-10-CM

## 2024-07-26 ENCOUNTER — Encounter: Payer: Self-pay | Admitting: Internal Medicine

## 2024-07-26 ENCOUNTER — Ambulatory Visit (INDEPENDENT_AMBULATORY_CARE_PROVIDER_SITE_OTHER): Admitting: Internal Medicine

## 2024-07-26 VITALS — BP 132/60 | HR 62 | Ht 66.0 in | Wt 221.2 lb

## 2024-07-26 DIAGNOSIS — I152 Hypertension secondary to endocrine disorders: Secondary | ICD-10-CM

## 2024-07-26 DIAGNOSIS — E782 Mixed hyperlipidemia: Secondary | ICD-10-CM

## 2024-07-26 DIAGNOSIS — E1159 Type 2 diabetes mellitus with other circulatory complications: Secondary | ICD-10-CM | POA: Diagnosis not present

## 2024-07-26 DIAGNOSIS — I10 Essential (primary) hypertension: Secondary | ICD-10-CM

## 2024-07-26 DIAGNOSIS — G459 Transient cerebral ischemic attack, unspecified: Secondary | ICD-10-CM

## 2024-07-26 DIAGNOSIS — E1169 Type 2 diabetes mellitus with other specified complication: Secondary | ICD-10-CM

## 2024-07-26 DIAGNOSIS — E119 Type 2 diabetes mellitus without complications: Secondary | ICD-10-CM

## 2024-07-26 DIAGNOSIS — Z6835 Body mass index (BMI) 35.0-35.9, adult: Secondary | ICD-10-CM

## 2024-07-26 DIAGNOSIS — M1000 Idiopathic gout, unspecified site: Secondary | ICD-10-CM

## 2024-07-26 DIAGNOSIS — I5033 Acute on chronic diastolic (congestive) heart failure: Secondary | ICD-10-CM | POA: Diagnosis not present

## 2024-07-26 DIAGNOSIS — E1165 Type 2 diabetes mellitus with hyperglycemia: Secondary | ICD-10-CM | POA: Diagnosis not present

## 2024-07-26 DIAGNOSIS — R42 Dizziness and giddiness: Secondary | ICD-10-CM | POA: Insufficient documentation

## 2024-07-26 LAB — POCT CBG (FASTING - GLUCOSE)-MANUAL ENTRY: Glucose Fasting, POC: 112 mg/dL — AB (ref 70–99)

## 2024-07-26 MED ORDER — FUROSEMIDE 20 MG PO TABS: 20.0000 mg | ORAL_TABLET | Freq: Every day | ORAL | 11 refills | Status: DC

## 2024-07-26 NOTE — Progress Notes (Signed)
 Established Patient Office Visit  Subjective:  Patient ID: Corey Johnson, male    DOB: Jun 09, 1941  Age: 83 y.o. MRN: [id number]  Chief Complaint  Patient presents with   Follow-up    1 month follow up    Patient is here today for follow up. He is accompanied by his daughter today. He reports he is feeling well and still endorses weakness. He has his rollator walker with him today. Patient never heard from PT to begin strength training. Will send to [facility].  He saw neurology 07/09/24 to FU on his suspected TIA from early October. They want him to have EEG done to rule out seizure activity as TIA workup was unremarkable. They also stated he could stop Plavix once he finished his prescription and continue aspirin monotherapy. EEG scheduled 08/09/24. Denies any bleeding complications.  He has since saw Cardiology 07/12/24 and they wanted stress test and Holter monitor placed. He was also switched to lasix from hydrochlorothiazide  due to hx of gout. Will check BMP in 2 weeks as patient has not started Lasix yet and will begin taking it today. Stress test scheduled 08/23/24 and FU with Cardiology 09/02/24 to discuss stress test and Holter monitor results.  Ophthalmologist eye exam showed Cataracts and recommended Surgical removal. He is due for AWV will get that scheduled.     No other concerns at this time.   Past Medical History:  Diagnosis Date   Arthritis    BPH (benign prostatic hyperplasia)    CRF (chronic renal failure)    Gout    H. pylori infection    Hyperlipemia    Hypertension    Psoriasis    Renal disorder    Renal insufficiency    Sleep apnea    CPAP   Type 2 diabetes mellitus without complication, without long-term current use of insulin (HCC) 06/21/2024   Vitamin D  deficiency     Past Surgical History:  Procedure Laterality Date   CHOLECYSTECTOMY N/A 02/06/2015   Procedure: LAPAROSCOPIC CHOLECYSTECTOMY WITH INTRAOPERATIVE CHOLANGIOGRAM;  Surgeon: [name],  MD;  Location: [facility] ORS;  Service: General;  Laterality: N/A;   COLONOSCOPY WITH PROPOFOL  N/A 06/13/2015   Procedure: COLONOSCOPY WITH PROPOFOL ;  Surgeon: [name], MD;  Location: [facility] END[facility]COPY;  Service: Endoscopy;  Laterality: N/A;   CYSTOSCOPY N/A 04/27/2015   Procedure: [name], CLOT EVACUATION ;  Surgeon: [name], MD;  Location: [facility] ORS;  Service: Urology;  Laterality: N/A;   GREEN LIGHT LASER TURP (TRANSURETHRAL RESECTION OF PROSTATE N/A 03/28/2015   Procedure: GREEN LIGHT LASER TURP (TRANSURETHRAL RESECTION OF PROSTATE;  Surgeon: [name], MD;  Location: [facility] ORS;  Service: Urology;  Laterality: N/A;   JOINT REPLACEMENT     left knee   KNEE ARTHROSCOPY     REPLACEMENT TOTAL KNEE     SHOULDER SURGERY Right    TRANSURETHRAL RESECTION OF PROSTATE      Social History   Socioeconomic History   Marital status: Married    Spouse name: Not on file   Number of children: Not on file   Years of education: Not on file   Highest education level: Not on file  Occupational History   Not on file  Tobacco Use   Smoking status: Former    Types: Cigarettes   Smokeless tobacco: Never  Substance and Sexual Activity   Alcohol use: No    Alcohol/week: 0.0 standard drinks of alcohol   Drug use: No   Sexual activity: Not  on file  Other Topics Concern   Not on file  Social History Narrative   Not on file   Social Drivers of Health   Financial Resource Strain: Low Risk (06/22/2021)   Received from [facility]   Overall Financial Resource Strain (CARDIA)    Difficulty of Paying Living Expenses: Not very hard  Food Insecurity: No Food Insecurity (06/11/2024)   Hunger Vital Sign    Worried About Running Out of Food in the Last Year: Never true    Ran Out of Food in the Last Year: Never true  Transportation Needs: No Transportation Needs (06/11/2024)   PRAPARE - Administrator, Civil Service (Medical): No    Lack of Transportation  (Non-Medical): No  Physical Activity: Not on file  Stress: Not on file  Social Connections: Moderately Isolated (06/11/2024)   Social Connection and Isolation Panel    Frequency of Communication with Friends and Family: More than three times a week    Frequency of Social Gatherings with Friends and Family: Three times a week    Attends Religious Services: More than 4 times per year    Active Member of Clubs or Organizations: No    Attends Banker Meetings: Never    Marital Status: Widowed  Intimate Partner Violence: Not At Risk (06/11/2024)   Humiliation, Afraid, Rape, and Kick questionnaire    Fear of Current or Ex-Partner: No    Emotionally Abused: No    Physically Abused: No    Sexually Abused: No    Family History  Problem Relation Age of Onset   Hypertension Mother    Pneumonia Father     Allergies  Allergen Reactions   Lisinopril     Other reaction(s): Other (See Comments) hyperkalemia   Metoprolol Other (See Comments)    Other reaction(s): Other (See Comments) severe cramps   Tapentadol      Other reaction(s): Hallucination   Tramadol Other (See Comments)   Nsaids Other (See Comments)    Kidney disease   Simvastatin Other (See Comments)    fatigue    Outpatient Medications Prior to Visit  Medication Sig   acetaminophen  (TYLENOL ) 500 MG tablet Take 1,000 mg by mouth every 6 (six) hours as needed for mild pain or moderate pain.   allopurinol  (ZYLOPRIM ) 300 MG tablet Take 1 tablet (300 mg total) by mouth daily.   amLODipine  (NORVASC ) 5 MG tablet Take 1 tablet (5 mg total) by mouth at bedtime.   aspirin 81 MG chewable tablet Chew 81 mg by mouth daily.   atorvastatin (LIPITOR) 80 MG tablet Take 1 tablet (80 mg total) by mouth daily.   Cholecalciferol  (VITAMIN D3) 1.25 MG (50000 UT) CAPS Take 1 capsule (1.25 mg total) by mouth once a week.   enalapril  (VASOTEC ) 20 MG tablet Take 1 tablet (20 mg total) by mouth daily.   gabapentin (NEURONTIN) 100 MG capsule  Take 1 capsule (100 mg total) by mouth 2 (two) times daily.   glucose blood (ACCU-CHEK GUIDE TEST) test strip Use as instructed   levocetirizine (XYZAL) 5 MG tablet Take 1 tablet (5 mg total) by mouth daily.   levothyroxine  (SYNTHROID ) 50 MCG tablet Take 1 tablet (50 mcg total) by mouth daily before breakfast.   Omega-3 Fatty Acids (OMEGA-3 FISH OIL) 1200 MG CAPS Take 2 capsules (2,400 mg total) by mouth daily.   omeprazole (PRILOSEC) 20 MG capsule Take 1 capsule (20 mg total) by mouth daily as needed (for heartburn/indigestion).   rOPINIRole  (  REQUIP ) 1 MG tablet Take 1 mg by mouth every evening.   RYBELSUS 3 MG TABS Take 1 tablet (3 mg total) by mouth every morning.   [DISCONTINUED] furosemide (LASIX) 20 MG tablet Take 1 tablet (20 mg total) by mouth daily.   No facility-administered medications prior to visit.    Review of Systems  Constitutional: Negative.  Negative for chills, fever and malaise/fatigue.  HENT: Negative.  Negative for congestion and sore throat.   Eyes: Negative.  Negative for blurred vision and pain.  Respiratory: Negative.  Negative for cough and shortness of breath.   Cardiovascular: Negative.  Negative for chest pain, palpitations and leg swelling.  Gastrointestinal: Negative.  Negative for abdominal pain, blood in stool, constipation, diarrhea, heartburn, melena, nausea and vomiting.  Genitourinary: Negative.  Negative for dysuria, flank pain, frequency and urgency.  Musculoskeletal: Negative.  Negative for joint pain and myalgias.  Skin: Negative.   Neurological:  Positive for weakness. Negative for dizziness, tingling, sensory change and headaches.  Endo/Heme/Allergies: Negative.   Psychiatric/Behavioral: Negative.  Negative for depression and suicidal ideas. The patient is not nervous/anxious.        Objective:   BP 132/60   Pulse 62   Ht 5' 6 (1.676 m)   Wt 221 lb 3.2 oz (100.3 kg)   SpO2 94%   BMI 35.70 kg/m   Vitals:   07/26/24 1023  BP:  132/60  Pulse: 62  Height: 5' 6 (1.676 m)  Weight: 221 lb 3.2 oz (100.3 kg)  SpO2: 94%  BMI (Calculated): 35.72    Physical Exam Vitals and nursing note reviewed.  Constitutional:      General: He is not in acute distress.    Appearance: Normal appearance. He is not ill-appearing.  HENT:     Head: Normocephalic and atraumatic.     Nose: Nose normal.     Mouth/Throat:     Mouth: Mucous membranes are moist.     Pharynx: Oropharynx is clear.  Eyes:     Conjunctiva/sclera: Conjunctivae normal.     Pupils: Pupils are equal, round, and reactive to light.  Cardiovascular:     Rate and Rhythm: Normal rate and regular rhythm.     Pulses: Normal pulses.     Heart sounds: Normal heart sounds.  Pulmonary:     Effort: Pulmonary effort is normal.     Breath sounds: Normal breath sounds. No wheezing or rhonchi.  Abdominal:     General: Bowel sounds are normal. There is no distension.     Palpations: Abdomen is soft.     Tenderness: There is no abdominal tenderness.  Musculoskeletal:        General: Normal range of motion.     Cervical back: Normal range of motion and neck supple.     Right lower leg: No edema.     Left lower leg: No edema.  Skin:    General: Skin is warm and dry.     Capillary Refill: Capillary refill takes less than 2 seconds.  Neurological:     General: No focal deficit present.     Mental Status: He is alert and oriented to person, place, and time.     Sensory: No sensory deficit.     Motor: No weakness.  Psychiatric:        Mood and Affect: Mood normal.        Behavior: Behavior normal.        Judgment: Judgment normal.      Results for  orders placed or performed in visit on 07/26/24  POCT CBG (Fasting - Glucose)  Result Value Ref Range   Glucose Fasting, POC 112 (A) 70 - 99 mg/dL    Recent Results (from the past 2160 hours)  Glucose, capillary     Status: Abnormal   Collection Time: 06/10/24  5:54 PM  Result Value Ref Range   Glucose-Capillary  136 (H) 70 - 99 mg/dL    Comment: Glucose reference range applies only to samples taken after fasting for at least 8 hours.  Basic metabolic panel     Status: Abnormal   Collection Time: 06/10/24  6:58 PM  Result Value Ref Range   Sodium 138 135 - 145 mmol/L   Potassium 3.7 3.5 - 5.1 mmol/L   Chloride 103 98 - 111 mmol/L   CO2 23 22 - 32 mmol/L   Glucose, Bld 114 (H) 70 - 99 mg/dL    Comment: Glucose reference range applies only to samples taken after fasting for at least 8 hours.   BUN 38 (H) 8 - 23 mg/dL   Creatinine, Ser 8.00 (H) 0.61 - 1.24 mg/dL   Calcium 8.9 8.9 - 89.6 mg/dL   GFR, Estimated 32 (L) >60 mL/min    Comment: (NOTE) Calculated using the CKD-EPI Creatinine Equation (2021)    Anion gap 12 5 - 15    Comment: Performed at [facility], [address] [place], [place] [address]  CBC     Status: Abnormal   Collection Time: 06/10/24  6:58 PM  Result Value Ref Range   WBC 12.1 (H) 4.0 - 10.5 K/uL   RBC 4.28 4.22 - 5.81 MIL/uL   Hemoglobin 11.9 (L) 13.0 - 17.0 g/dL   HCT 63.8 (L) 60.9 - 47.9 %   MCV 84.3 80.0 - 100.0 fL   MCH 27.8 26.0 - 34.0 pg   MCHC 33.0 30.0 - 36.0 g/dL   RDW 85.6 88.4 - 84.4 %   Platelets 317 150 - 400 K/uL   nRBC 0.0 0.0 - 0.2 %    Comment: Performed at [facility], [address], [place], [place] [address]  Troponin I (High Sensitivity)     Status: None   Collection Time: 06/10/24  6:58 PM  Result Value Ref Range   Troponin I (High Sensitivity) 9 <18 ng/L    Comment: (NOTE) Elevated high sensitivity troponin I (hsTnI) values and significant  changes across serial measurements may suggest ACS but many other  chronic and acute conditions are known to elevate hsTnI results.  Refer to the Links section for chest pain algorithms and additional  guidance. Performed at [facility], [address] [place], [place] [address]   Troponin I (High Sensitivity)     Status: None   Collection Time: 06/10/24  8:35 PM   Result Value Ref Range   Troponin I (High Sensitivity) 7 <18 ng/L    Comment: (NOTE) Elevated high sensitivity troponin I (hsTnI) values and significant  changes across serial measurements may suggest ACS but many other  chronic and acute conditions are known to elevate hsTnI results.  Refer to the Links section for chest pain algorithms and additional  guidance. Performed at [facility], [address] [place], [place] [address]   Glucose, capillary     Status: Abnormal   Collection Time: 06/11/24 12:40 AM  Result Value Ref Range   Glucose-Capillary 124 (H) 70 - 99 mg/dL    Comment: Glucose reference range applies only to  samples taken after fasting for at least 8 hours.  Lipid panel     Status: Abnormal   Collection Time: 06/11/24  3:33 AM  Result Value Ref Range   Cholesterol 103 0 - 200 mg/dL   Triglycerides 75 <849 mg/dL   HDL 36 (L) >59 mg/dL   Total CHOL/HDL Ratio 2.9 RATIO   VLDL 15 0 - 40 mg/dL   LDL Cholesterol 52 0 - 99 mg/dL    Comment:        Total Cholesterol/HDL:CHD Risk Coronary Heart Disease Risk Table                     Men   Women  1/2 Average Risk   3.4   3.3  Average Risk       5.0   4.4  2 X Average Risk   9.6   7.1  3 X Average Risk  23.4   11.0        Use the calculated Patient Ratio above and the CHD Risk Table to determine the patient's CHD Risk.        ATP III CLASSIFICATION (LDL):  <100     mg/dL   Optimal  899-870  mg/dL   Near or Above                    Optimal  130-159  mg/dL   Borderline  839-810  mg/dL   High  >809     mg/dL   Very High Performed at [facility], [address] [place], [place] [address]   CBC with Differential/Platelet     Status: Abnormal   Collection Time: 06/11/24  3:33 AM  Result Value Ref Range   WBC 12.0 (H) 4.0 - 10.5 K/uL   RBC 4.16 (L) 4.22 - 5.81 MIL/uL   Hemoglobin 11.6 (L) 13.0 - 17.0 g/dL   HCT 65.1 (L) 60.9 - 47.9 %   MCV 83.7 80.0 - 100.0 fL   MCH 27.9 26.0 - 34.0 pg    MCHC 33.3 30.0 - 36.0 g/dL   RDW 85.8 88.4 - 84.4 %   Platelets PLATELET CLUMPS NOTED ON SMEAR, UNABLE TO ESTIMATE 150 - 400 K/uL    Comment: PLATELET CLUMPS NOTED ON SMEAR, UNABLE TO ESTIMATE   nRBC 0.0 0.0 - 0.2 %   Neutrophils Relative % 69 %   Neutro Abs 8.3 (H) 1.7 - 7.7 K/uL   Lymphocytes Relative 21 %   Lymphs Abs 2.6 0.7 - 4.0 K/uL   Monocytes Relative 7 %   Monocytes Absolute 0.8 0.1 - 1.0 K/uL   Eosinophils Relative 1 %   Eosinophils Absolute 0.1 0.0 - 0.5 K/uL   Basophils Relative 1 %   Basophils Absolute 0.1 0.0 - 0.1 K/uL   WBC Morphology MORPHOLOGY UNREMARKABLE    RBC Morphology See Note     Comment: MIXED RBC POPULATION   Smear Review See Note     Comment: PLATELET CLUMPS NOTED ON SMEAR, UNABLE TO ESTIMATE   Immature Granulocytes 1 %   Abs Immature Granulocytes 0.06 0.00 - 0.07 K/uL    Comment: Performed at [facility], [address], [place], [place] [address]  Basic metabolic panel     Status: Abnormal   Collection Time: 06/11/24  3:33 AM  Result Value Ref Range   Sodium 138 135 - 145 mmol/L   Potassium 3.9 3.5 - 5.1 mmol/L   Chloride 105 98 - 111 mmol/L  CO2 23 22 - 32 mmol/L   Glucose, Bld 108 (H) 70 - 99 mg/dL    Comment: Glucose reference range applies only to samples taken after fasting for at least 8 hours.   BUN 32 (H) 8 - 23 mg/dL   Creatinine, Ser 8.31 (H) 0.61 - 1.24 mg/dL   Calcium 8.5 (L) 8.9 - 10.3 mg/dL   GFR, Estimated 40 (L) >60 mL/min    Comment: (NOTE) Calculated using the CKD-EPI Creatinine Equation (2021)    Anion gap 10 5 - 15    Comment: Performed at [facility], [address] [place], [place] [address]  Hemoglobin A1c     Status: Abnormal   Collection Time: 06/11/24  3:33 AM  Result Value Ref Range   Hgb A1c MFr Bld 6.2 (H) 4.8 - 5.6 %    Comment: (NOTE) Diagnosis of Diabetes The following HbA1c ranges recommended by the American Diabetes Association (ADA) may be used as an aid in the diagnosis of  diabetes mellitus.  Hemoglobin             Suggested A1C NGSP%              Diagnosis  <5.7                   Non Diabetic  5.7-6.4                Pre-Diabetic  >6.4                   Diabetic  <7.0                   Glycemic control for                       adults with diabetes.     Mean Plasma Glucose 131.24 mg/dL    Comment: Performed at [facility], [address], [place], [place] [address]  Glucose, capillary     Status: Abnormal   Collection Time: 06/11/24  7:37 AM  Result Value Ref Range   Glucose-Capillary 100 (H) 70 - 99 mg/dL    Comment: Glucose reference range applies only to samples taken after fasting for at least 8 hours.  Glucose, capillary     Status: Abnormal   Collection Time: 06/11/24 11:38 AM  Result Value Ref Range   Glucose-Capillary 123 (H) 70 - 99 mg/dL    Comment: Glucose reference range applies only to samples taken after fasting for at least 8 hours.  ECHOCARDIOGRAM COMPLETE     Status: None   Collection Time: 06/11/24 11:57 AM  Result Value Ref Range   Weight 3,513.25 oz   Height 69 in   BP 123/70 mmHg   Ao pk vel 1.26 m/s   AV Area VTI 4.20 cm2   AR max vel 3.15 cm2   AV Mean grad 3.5 mmHg   AV Peak grad 6.3 mmHg   S' Lateral 3.20 cm   AV Area mean vel 2.99 cm2   Area-P 1/2 2.81 cm2   MV VTI 2.68 cm2   Est EF 60 - 65%   Glucose, capillary     Status: None   Collection Time: 06/11/24  4:25 PM  Result Value Ref Range   Glucose-Capillary 90 70 - 99 mg/dL    Comment: Glucose reference range applies only to samples taken after fasting for at least 8 hours.  POCT CBG (Fasting - Glucose)  Status: Abnormal   Collection Time: 06/21/24 10:01 AM  Result Value Ref Range   Glucose Fasting, POC 116 (A) 70 - 99 mg/dL  POC CREATINE & ALBUMIN,URINE     Status: None   Collection Time: 06/21/24 10:54 AM  Result Value Ref Range   Microalbumin Ur, POC 80 mg/L   Creatinine, POC 100 mg/dL   Albumin/Creatinine Ratio, Urine, POC <30   POCT  Urinalysis Dipstick (18997)     Status: None   Collection Time: 06/21/24 10:57 AM  Result Value Ref Range   Color, UA Yellow    Clarity, UA Clear    Glucose, UA Negative Negative   Bilirubin, UA Negative    Ketones, UA Negative    Spec Grav, UA 1.015 1.010 - 1.025   Blood, UA Negative    pH, UA 5.5 5.0 - 8.0   Protein, UA Negative Negative   Urobilinogen, UA 0.2 0.2 or 1.0 E.U./dL   Nitrite, UA Negative    Leukocytes, UA Negative Negative   Appearance Clear    Odor Yes   CBC with Diff     Status: Abnormal   Collection Time: 06/21/24 11:01 AM  Result Value Ref Range   WBC 9.3 3.4 - 10.8 x10E3/uL   RBC 4.78 4.14 - 5.80 x10E6/uL   Hemoglobin 13.2 13.0 - 17.7 g/dL   Hematocrit 57.8 62.4 - 51.0 %   MCV 88 79 - 97 fL   MCH 27.6 26.6 - 33.0 pg   MCHC 31.4 (L) 31.5 - 35.7 g/dL   RDW 85.5 88.3 - 84.5 %   Platelets 338 150 - 450 x10E3/uL   Neutrophils 56 Not Estab. %   Lymphs 31 Not Estab. %   Monocytes 8 Not Estab. %   Eos 4 Not Estab. %   Basos 1 Not Estab. %   Neutrophils Absolute 5.2 1.4 - 7.0 x10E3/uL   Lymphocytes Absolute 2.9 0.7 - 3.1 x10E3/uL   Monocytes Absolute 0.7 0.1 - 0.9 x10E3/uL   EOS (ABSOLUTE) 0.4 0.0 - 0.4 x10E3/uL   Basophils Absolute 0.1 0.0 - 0.2 x10E3/uL   Immature Granulocytes 0 Not Estab. %   Immature Grans (Abs) 0.0 0.0 - 0.1 x10E3/uL  CMP14+EGFR     Status: Abnormal   Collection Time: 06/21/24 11:01 AM  Result Value Ref Range   Glucose 93 70 - 99 mg/dL   BUN 26 8 - 27 mg/dL   Creatinine, Ser 8.55 (H) 0.76 - 1.27 mg/dL   eGFR 48 (L) >40 fO/fpw/8.26   BUN/Creatinine Ratio 18 10 - 24   Sodium 143 134 - 144 mmol/L   Potassium 5.0 3.5 - 5.2 mmol/L   Chloride 104 96 - 106 mmol/L   CO2 23 20 - 29 mmol/L   Calcium 10.1 8.6 - 10.2 mg/dL   Total Protein 7.0 6.0 - 8.5 g/dL   Albumin 4.5 3.7 - 4.7 g/dL   Globulin, Total 2.5 1.5 - 4.5 g/dL   Bilirubin Total 0.4 0.0 - 1.2 mg/dL   Alkaline Phosphatase 134 (H) 48 - 129 IU/L   AST 13 0 - 40 IU/L   ALT 13 0  - 44 IU/L  TSH+T4F+T3Free     Status: None   Collection Time: 06/21/24 11:01 AM  Result Value Ref Range   TSH 1.980 0.450 - 4.500 uIU/mL   T3, Free 3.1 2.0 - 4.4 pg/mL   Free T4 1.38 0.82 - 1.77 ng/dL  Vitamin D  (25 hydroxy)     Status: None   Collection Time:  06/21/24 11:01 AM  Result Value Ref Range   Vit D, 25-Hydroxy 78.8 30.0 - 100.0 ng/mL    Comment: Vitamin D  deficiency has been defined by the Institute of Medicine and an Endocrine Society practice guideline as a level of serum 25-OH vitamin D  less than 20 ng/mL (1,2). The Endocrine Society went on to further define vitamin D  insufficiency as a level between 21 and 29 ng/mL (2). 1. IOM (Institute of Medicine). 2010. Dietary reference    intakes for calcium and D. [place]: The    [employer]. 2. Holick MF, Binkley Curtis, Bischoff-Ferrari HA, et al.    Evaluation, treatment, and prevention of vitamin D     deficiency: an Endocrine Society clinical practice    guideline. JCEM. 2011 Jul; 96(7):1911-30.   Uric acid     Status: None   Collection Time: 06/21/24 11:01 AM  Result Value Ref Range   Uric Acid 3.9 3.8 - 8.4 mg/dL    Comment:            Therapeutic target for gout patients: <6.0  POCT CBG (Fasting - Glucose)     Status: Abnormal   Collection Time: 07/26/24 10:28 AM  Result Value Ref Range   Glucose Fasting, POC 112 (A) 70 - 99 mg/dL      Assessment & Plan:  Continue taking medications as prescribed. Check BMP 2 weeks after switching to lasix. Keep specialist appointments as scheduled.  Problem List Items Addressed This Visit     Gout (Chronic)   Relevant Medications   furosemide (LASIX) 20 MG tablet   HTN (hypertension) - Primary (Chronic)   Relevant Medications   furosemide (LASIX) 20 MG tablet   Transient ischemic attack   Relevant Medications   furosemide (LASIX) 20 MG tablet   Type 2 diabetes mellitus without complication, without long-term current use of insulin (HCC)   Relevant  Medications   furosemide (LASIX) 20 MG tablet   Other Relevant Orders   POCT CBG (Fasting - Glucose) (Completed)   Combined hyperlipidemia associated with type 2 diabetes mellitus (HCC)   Relevant Medications   furosemide (LASIX) 20 MG tablet   Severe obesity with body mass index (BMI) of 35.0 to 35.9 and comorbidity (HCC)   CHF (congestive heart failure), NYHA class III, acute on chronic, diastolic (HCC)   Relevant Medications   furosemide (LASIX) 20 MG tablet   Other Relevant Orders   Basic metabolic panel with GFR    Return in about 5 weeks (around 08/30/2024) for AWV.   Total time spent: 25 minutes. This time includes review of previous notes and results and patient face to face interaction during today's visit.    FERNAND FREDY RAMAN, MD  07/26/2024   This document may have been prepared by [facility] Voice Recognition software and as such may include unintentional dictation errors.

## 2024-08-02 DIAGNOSIS — R55 Syncope and collapse: Secondary | ICD-10-CM | POA: Diagnosis not present

## 2024-08-09 DIAGNOSIS — R569 Unspecified convulsions: Secondary | ICD-10-CM | POA: Diagnosis not present

## 2024-08-09 DIAGNOSIS — R55 Syncope and collapse: Secondary | ICD-10-CM | POA: Diagnosis not present

## 2024-08-23 ENCOUNTER — Ambulatory Visit

## 2024-08-23 DIAGNOSIS — E119 Type 2 diabetes mellitus without complications: Secondary | ICD-10-CM

## 2024-08-23 DIAGNOSIS — R42 Dizziness and giddiness: Secondary | ICD-10-CM

## 2024-08-23 DIAGNOSIS — E1169 Type 2 diabetes mellitus with other specified complication: Secondary | ICD-10-CM

## 2024-08-23 DIAGNOSIS — R55 Syncope and collapse: Secondary | ICD-10-CM

## 2024-08-23 DIAGNOSIS — G459 Transient cerebral ischemic attack, unspecified: Secondary | ICD-10-CM

## 2024-08-23 DIAGNOSIS — I10 Essential (primary) hypertension: Secondary | ICD-10-CM

## 2024-08-23 DIAGNOSIS — G4730 Sleep apnea, unspecified: Secondary | ICD-10-CM

## 2024-08-23 MED ORDER — TECHNETIUM TC 99M SESTAMIBI GENERIC - CARDIOLITE
30.6000 | Freq: Once | INTRAVENOUS | Status: AC | PRN
  Administered 2024-08-23: 12:00:00 30.6 via INTRAVENOUS

## 2024-08-23 MED ORDER — TECHNETIUM TC 99M SESTAMIBI GENERIC - CARDIOLITE
11.0000 | Freq: Once | INTRAVENOUS | Status: AC | PRN
  Administered 2024-08-23: 10:00:00 11 via INTRAVENOUS

## 2024-08-30 ENCOUNTER — Ambulatory Visit: Admitting: Cardiovascular Disease

## 2024-08-30 ENCOUNTER — Other Ambulatory Visit

## 2024-08-30 ENCOUNTER — Encounter: Payer: Self-pay | Admitting: Cardiovascular Disease

## 2024-08-30 ENCOUNTER — Other Ambulatory Visit: Payer: Self-pay | Admitting: Internal Medicine

## 2024-08-30 VITALS — BP 104/60 | HR 87 | Ht 66.0 in | Wt 220.4 lb

## 2024-08-30 DIAGNOSIS — G459 Transient cerebral ischemic attack, unspecified: Secondary | ICD-10-CM | POA: Diagnosis not present

## 2024-08-30 DIAGNOSIS — E1169 Type 2 diabetes mellitus with other specified complication: Secondary | ICD-10-CM | POA: Diagnosis not present

## 2024-08-30 DIAGNOSIS — I152 Hypertension secondary to endocrine disorders: Secondary | ICD-10-CM

## 2024-08-30 DIAGNOSIS — G4730 Sleep apnea, unspecified: Secondary | ICD-10-CM

## 2024-08-30 DIAGNOSIS — R55 Syncope and collapse: Secondary | ICD-10-CM

## 2024-08-30 DIAGNOSIS — I10 Essential (primary) hypertension: Secondary | ICD-10-CM

## 2024-08-30 DIAGNOSIS — Z8679 Personal history of other diseases of the circulatory system: Secondary | ICD-10-CM

## 2024-08-30 DIAGNOSIS — I5033 Acute on chronic diastolic (congestive) heart failure: Secondary | ICD-10-CM

## 2024-08-30 DIAGNOSIS — E782 Mixed hyperlipidemia: Secondary | ICD-10-CM | POA: Diagnosis not present

## 2024-08-30 DIAGNOSIS — E039 Hypothyroidism, unspecified: Secondary | ICD-10-CM

## 2024-08-30 DIAGNOSIS — E1159 Type 2 diabetes mellitus with other circulatory complications: Secondary | ICD-10-CM

## 2024-08-30 MED ORDER — APIXABAN 2.5 MG PO TABS: 2.5000 mg | ORAL_TABLET | Freq: Two times a day (BID) | ORAL | 0 refills | Status: DC

## 2024-08-30 MED ORDER — AMIODARONE HCL 200 MG PO TABS: ORAL_TABLET | ORAL | 0 refills | Status: DC

## 2024-08-30 NOTE — Progress Notes (Signed)
 "     Cardiology Office Note   Date:  08/30/2024   ID:  [name], [name] [birth year], MRN [id number]  PCP:  Fernand Fredy RAMAN, MD  Cardiologist:  Denyse Fernand, MD      History of Present Illness: Corey Johnson is a 83 y.o. male who presents for  Chief Complaint  Patient presents with   Follow-up    Holter & NST results    Feeling weak.      Past Medical History:  Diagnosis Date   Arthritis    BPH (benign prostatic hyperplasia)    CRF (chronic renal failure)    Gout    H. pylori infection    Hyperlipemia    Hypertension    Psoriasis    Renal disorder    Renal insufficiency    Sleep apnea    CPAP   Type 2 diabetes mellitus without complication, without long-term current use of insulin  (HCC) 06/21/2024   Vitamin D  deficiency      Past Surgical History:  Procedure Laterality Date   CHOLECYSTECTOMY N/A 02/06/2015   Procedure: LAPAROSCOPIC CHOLECYSTECTOMY WITH INTRAOPERATIVE CHOLANGIOGRAM;  Surgeon: [name], MD;  Location: [facility];  Service: General;  Laterality: N/A;   COLONOSCOPY WITH PROPOFOL  N/A 06/13/2015   Procedure: COLONOSCOPY WITH PROPOFOL ;  Surgeon: [name], MD;  Location: [facility];  Service: Endoscopy;  Laterality: N/A;   CYSTOSCOPY N/A 04/27/2015   Procedure: [name], CLOT EVACUATION ;  Surgeon: [name], MD;  Location: [facility];  Service: Urology;  Laterality: N/A;   GREEN LIGHT LASER TURP (TRANSURETHRAL RESECTION OF PROSTATE N/A 03/28/2015   Procedure: GREEN LIGHT LASER TURP (TRANSURETHRAL RESECTION OF PROSTATE;  Surgeon: [name], MD;  Location: [facility];  Service: Urology;  Laterality: N/A;   JOINT REPLACEMENT     left knee   KNEE ARTHROSCOPY     REPLACEMENT TOTAL KNEE     SHOULDER SURGERY Right    TRANSURETHRAL RESECTION OF PROSTATE       Current Outpatient Medications  Medication Sig Dispense Refill   acetaminophen  (TYLENOL ) 500 MG tablet Take 1,000 mg by mouth every 6 (six) hours as needed for mild  pain or moderate pain.     allopurinol  (ZYLOPRIM ) 300 MG tablet Take 1 tablet (300 mg total) by mouth daily. 90 tablet 3   amiodarone  (PACERONE ) 200 MG tablet Take 4 tablets/day for 1 week, then 3 tablets/day for 1 week, then 2 tablets/day for 1 week. Then follow up. 70 tablet 0   apixaban  (ELIQUIS ) 2.5 MG TABS tablet Take 1 tablet (2.5 mg total) by mouth 2 (two) times daily. 60 tablet 0   atorvastatin  (LIPITOR) 80 MG tablet Take 1 tablet (80 mg total) by mouth daily. 90 tablet 3   Cholecalciferol  (VITAMIN D3) 1.25 MG (50000 UT) CAPS Take 1 capsule (1.25 mg total) by mouth once a week. 12 capsule 3   dapagliflozin propanediol (FARXIGA) 10 MG TABS tablet Take 10 mg by mouth daily.     enalapril  (VASOTEC ) 20 MG tablet Take 1 tablet (20 mg total) by mouth daily. 90 tablet 3   furosemide  (LASIX ) 20 MG tablet Take 1 tablet (20 mg total) by mouth daily. 30 tablet 11   gabapentin  (NEURONTIN ) 100 MG capsule Take 1 capsule (100 mg total) by mouth 2 (two) times daily. 180 capsule 3   glucose blood (ACCU-CHEK GUIDE TEST) test strip Use as instructed 100 each 12   levocetirizine (XYZAL ) 5 MG tablet Take 1 tablet (  5 mg total) by mouth daily. 90 tablet 3   levothyroxine  (SYNTHROID ) 50 MCG tablet Take 1 tablet (50 mcg total) by mouth daily before breakfast. 30 tablet 1   Omega-3 Fatty Acids (OMEGA-3 FISH OIL ) 1200 MG CAPS Take 2 capsules (2,400 mg total) by mouth daily. 180 capsule 3   omeprazole  (PRILOSEC) 20 MG capsule Take 1 capsule (20 mg total) by mouth daily as needed (for heartburn/indigestion). 30 capsule 2   rOPINIRole  (REQUIP ) 1 MG tablet Take 1 mg by mouth every evening.     RYBELSUS  3 MG TABS Take 1 tablet (3 mg total) by mouth every morning. 30 tablet 6   No current facility-administered medications for this visit.    Allergies:   Lisinopril, Metoprolol, Tapentadol , Tramadol, Nsaids, and Simvastatin    Social History:   reports that he has quit smoking. His smoking use included cigarettes. He  has never used smokeless tobacco. He reports that he does not drink alcohol and does not use drugs.   Family History:  family history includes Hypertension in his mother; Pneumonia in his father.    ROS:     Review of Systems  Constitutional: Negative.   HENT: Negative.    Eyes: Negative.   Respiratory: Negative.    Gastrointestinal: Negative.   Genitourinary: Negative.   Musculoskeletal: Negative.   Skin: Negative.   Neurological: Negative.   Endo/Heme/Allergies: Negative.   Psychiatric/Behavioral: Negative.    All other systems reviewed and are negative.     All other systems are reviewed and negative.    PHYSICAL EXAM: VS:  BP 104/60   Pulse 87   Ht 5' 6 (1.676 m)   Wt 220 lb 6.4 oz (100 kg)   SpO2 92%   BMI 35.57 kg/m  , BMI Body mass index is 35.57 kg/m. Last weight:  Wt Readings from Last 3 Encounters:  08/30/24 220 lb 6.4 oz (100 kg)  07/26/24 221 lb 3.2 oz (100.3 kg)  07/12/24 224 lb (101.6 kg)     Physical Exam Vitals reviewed.  Constitutional:      Appearance: Normal appearance. He is normal weight.  HENT:     Head: Normocephalic.     Nose: Nose normal.     Mouth/Throat:     Mouth: Mucous membranes are moist.  Eyes:     Pupils: Pupils are equal, round, and reactive to light.  Cardiovascular:     Rate and Rhythm: Normal rate and regular rhythm.     Pulses: Normal pulses.     Heart sounds: Normal heart sounds.  Pulmonary:     Effort: Pulmonary effort is normal.  Abdominal:     General: Abdomen is flat. Bowel sounds are normal.  Musculoskeletal:        General: Normal range of motion.     Cervical back: Normal range of motion.  Skin:    General: Skin is warm.  Neurological:     General: No focal deficit present.     Mental Status: He is alert.  Psychiatric:        Mood and Affect: Mood normal.       EKG:   Recent Labs: 06/21/2024: ALT 13; BUN 26; Creatinine, Ser 1.44; Hemoglobin 13.2; Platelets 338; Potassium 5.0; Sodium 143; TSH  1.980    Lipid Panel    Component Value Date/Time   CHOL 103 06/11/2024 0333   TRIG 75 06/11/2024 0333   HDL 36 (L) 06/11/2024 0333   CHOLHDL 2.9 06/11/2024 0333   VLDL 15  06/11/2024 0333   LDLCALC 52 06/11/2024 0333      Other studies Reviewed: Additional studies/ records that were reviewed today include:  Review of the above records demonstrates:       No data to display            ASSESSMENT AND PLAN:    ICD-10-CM   1. Atrial fibrillation, currently in sinus rhythm  Z86.79 amiodarone  (PACERONE ) 200 MG tablet    apixaban  (ELIQUIS ) 2.5 MG TABS tablet   New onset paroxysmal atrial fibrilation with RVR 145/mi, and sinus bradycardia. Add eliquis2.5 bid, stop asp and amiodrone.    2. Hypertension associated with diabetes (HCC)  E11.59 amiodarone  (PACERONE ) 200 MG tablet   I15.2 apixaban  (ELIQUIS ) 2.5 MG TABS tablet    3. CHF (congestive heart failure), NYHA class III, acute on chronic, diastolic (HCC)  I50.33 amiodarone  (PACERONE ) 200 MG tablet    apixaban  (ELIQUIS ) 2.5 MG TABS tablet    4. Transient ischemic attack  G45.9 amiodarone  (PACERONE ) 200 MG tablet    apixaban  (ELIQUIS ) 2.5 MG TABS tablet    5. Primary hypertension  I10 amiodarone  (PACERONE ) 200 MG tablet    apixaban  (ELIQUIS ) 2.5 MG TABS tablet    6. Sleep apnea, unspecified type  G47.30 amiodarone  (PACERONE ) 200 MG tablet    apixaban  (ELIQUIS ) 2.5 MG TABS tablet    7. Syncope, unspecified syncope type  R55 amiodarone  (PACERONE ) 200 MG tablet    apixaban  (ELIQUIS ) 2.5 MG TABS tablet   Stress test was normal. LVEF is normal. Advise stopping amlodapine as feels weak and BP is low. Holter atrial fibrilation with RVR 145/min and bradycardia.    8. Combined hyperlipidemia associated with type 2 diabetes mellitus (HCC)  E11.69 amiodarone  (PACERONE ) 200 MG tablet   E78.2 apixaban  (ELIQUIS ) 2.5 MG TABS tablet       Problem List Items Addressed This Visit       Cardiovascular and Mediastinum   HTN  (hypertension) (Chronic)   Relevant Medications   amiodarone  (PACERONE ) 200 MG tablet   apixaban  (ELIQUIS ) 2.5 MG TABS tablet   Transient ischemic attack   Relevant Medications   amiodarone  (PACERONE ) 200 MG tablet   apixaban  (ELIQUIS ) 2.5 MG TABS tablet   CHF (congestive heart failure), NYHA class III, acute on chronic, diastolic (HCC)   Relevant Medications   amiodarone  (PACERONE ) 200 MG tablet   apixaban  (ELIQUIS ) 2.5 MG TABS tablet     Respiratory   Sleep apnea (Chronic)   Relevant Medications   amiodarone  (PACERONE ) 200 MG tablet   apixaban  (ELIQUIS ) 2.5 MG TABS tablet     Endocrine   Combined hyperlipidemia associated with type 2 diabetes mellitus (HCC)   Relevant Medications   dapagliflozin propanediol (FARXIGA) 10 MG TABS tablet   amiodarone  (PACERONE ) 200 MG tablet   apixaban  (ELIQUIS ) 2.5 MG TABS tablet   Other Visit Diagnoses       Atrial fibrillation, currently in sinus rhythm    -  Primary   New onset paroxysmal atrial fibrilation with RVR 145/mi, and sinus bradycardia. Add eliquis2.5 bid, stop asp and amiodrone.   Relevant Medications   amiodarone  (PACERONE ) 200 MG tablet   apixaban  (ELIQUIS ) 2.5 MG TABS tablet     Hypertension associated with diabetes (HCC)       Relevant Medications   dapagliflozin propanediol (FARXIGA) 10 MG TABS tablet   amiodarone  (PACERONE ) 200 MG tablet   apixaban  (ELIQUIS ) 2.5 MG TABS tablet     Syncope, unspecified syncope type  Stress test was normal. LVEF is normal. Advise stopping amlodapine as feels weak and BP is low. Holter atrial fibrilation with RVR 145/min and bradycardia.   Relevant Medications   amiodarone  (PACERONE ) 200 MG tablet   apixaban  (ELIQUIS ) 2.5 MG TABS tablet          Disposition:   Return in about 3 weeks (around 09/20/2024).    Total time spent: 35 minutes  Signed,  Denyse Bathe, MD  08/30/2024 12:03 PM    [facility] "

## 2024-08-31 ENCOUNTER — Ambulatory Visit: Payer: Self-pay | Admitting: Internal Medicine

## 2024-08-31 LAB — BASIC METABOLIC PANEL WITH GFR
BUN/Creatinine Ratio: 17 (ref 10–24)
BUN: 31 mg/dL — ABNORMAL HIGH (ref 8–27)
CO2: 21 mmol/L (ref 20–29)
Calcium: 9.7 mg/dL (ref 8.6–10.2)
Chloride: 104 mmol/L (ref 96–106)
Creatinine, Ser: 1.83 mg/dL — ABNORMAL HIGH (ref 0.76–1.27)
Glucose: 100 mg/dL — ABNORMAL HIGH (ref 70–99)
Potassium: 4.5 mmol/L (ref 3.5–5.2)
Sodium: 142 mmol/L (ref 134–144)
eGFR: 36 mL/min/1.73 — ABNORMAL LOW

## 2024-09-03 NOTE — Progress Notes (Signed)
 Patient notified

## 2024-09-06 ENCOUNTER — Other Ambulatory Visit: Payer: Self-pay | Admitting: Internal Medicine

## 2024-09-06 ENCOUNTER — Ambulatory Visit: Admitting: Internal Medicine

## 2024-09-06 MED ORDER — ROPINIROLE HCL 1 MG PO TABS: 1.0000 mg | ORAL_TABLET | Freq: Every evening | ORAL | 1 refills | Status: AC

## 2024-09-14 ENCOUNTER — Encounter: Payer: Self-pay | Admitting: Internal Medicine

## 2024-09-20 ENCOUNTER — Ambulatory Visit (INDEPENDENT_AMBULATORY_CARE_PROVIDER_SITE_OTHER): Admitting: Cardiovascular Disease

## 2024-09-20 ENCOUNTER — Encounter: Payer: Self-pay | Admitting: Cardiovascular Disease

## 2024-09-20 DIAGNOSIS — E782 Mixed hyperlipidemia: Secondary | ICD-10-CM

## 2024-09-20 DIAGNOSIS — Z8679 Personal history of other diseases of the circulatory system: Secondary | ICD-10-CM | POA: Diagnosis not present

## 2024-09-20 DIAGNOSIS — E1159 Type 2 diabetes mellitus with other circulatory complications: Secondary | ICD-10-CM | POA: Diagnosis not present

## 2024-09-20 DIAGNOSIS — G459 Transient cerebral ischemic attack, unspecified: Secondary | ICD-10-CM

## 2024-09-20 DIAGNOSIS — I152 Hypertension secondary to endocrine disorders: Secondary | ICD-10-CM | POA: Diagnosis not present

## 2024-09-20 DIAGNOSIS — I10 Essential (primary) hypertension: Secondary | ICD-10-CM

## 2024-09-20 DIAGNOSIS — G4730 Sleep apnea, unspecified: Secondary | ICD-10-CM | POA: Diagnosis not present

## 2024-09-20 DIAGNOSIS — R55 Syncope and collapse: Secondary | ICD-10-CM

## 2024-09-20 DIAGNOSIS — E1169 Type 2 diabetes mellitus with other specified complication: Secondary | ICD-10-CM

## 2024-09-20 DIAGNOSIS — I5033 Acute on chronic diastolic (congestive) heart failure: Secondary | ICD-10-CM | POA: Diagnosis not present

## 2024-09-20 MED ORDER — AMIODARONE HCL 200 MG PO TABS: 200.0000 mg | ORAL_TABLET | Freq: Every day | ORAL | 11 refills | Status: DC

## 2024-09-20 MED ORDER — APIXABAN 2.5 MG PO TABS: 2.5000 mg | ORAL_TABLET | Freq: Two times a day (BID) | ORAL | 0 refills | Status: DC

## 2024-09-20 NOTE — Progress Notes (Signed)
 "     Cardiology Office Note   Date:  09/20/2024   ID:  Corey Johnson, Corey Johnson [birth year], MRN [id number]  PCP:  Fernand Fredy RAMAN, MD  Cardiologist:  Denyse Fernand, MD      History of Present Illness: Corey Johnson is a 84 y.o. male who presents for  Chief Complaint  Patient presents with   Follow-up    3 weeks follow up    Has chest pains , after had Gallbladder taken out. Thinks may be gas.      Past Medical History:  Diagnosis Date   Arthritis    BPH (benign prostatic hyperplasia)    CRF (chronic renal failure)    Gout    H. pylori infection    Hyperlipemia    Hypertension    Psoriasis    Renal disorder    Renal insufficiency    Sleep apnea    CPAP   Type 2 diabetes mellitus without complication, without long-term current use of insulin  (HCC) 06/21/2024   Vitamin D  deficiency      Past Surgical History:  Procedure Laterality Date   CHOLECYSTECTOMY N/A 02/06/2015   Procedure: LAPAROSCOPIC CHOLECYSTECTOMY WITH INTRAOPERATIVE CHOLANGIOGRAM;  Surgeon: [name], MD;  Location: [facility];  Service: General;  Laterality: N/A;   COLONOSCOPY WITH PROPOFOL  N/A 06/13/2015   Procedure: COLONOSCOPY WITH PROPOFOL ;  Surgeon: [name], MD;  Location: [facility];  Service: Endoscopy;  Laterality: N/A;   CYSTOSCOPY N/A 04/27/2015   Procedure: [name], CLOT EVACUATION ;  Surgeon: [name], MD;  Location: [facility];  Service: Urology;  Laterality: N/A;   GREEN LIGHT LASER TURP (TRANSURETHRAL RESECTION OF PROSTATE N/A 03/28/2015   Procedure: GREEN LIGHT LASER TURP (TRANSURETHRAL RESECTION OF PROSTATE;  Surgeon: [name], MD;  Location: [facility];  Service: Urology;  Laterality: N/A;   JOINT REPLACEMENT     left knee   KNEE ARTHROSCOPY     REPLACEMENT TOTAL KNEE     SHOULDER SURGERY Right    TRANSURETHRAL RESECTION OF PROSTATE       Current Outpatient Medications  Medication Sig Dispense Refill   acetaminophen  (TYLENOL ) 500 MG tablet Take 1,000  mg by mouth every 6 (six) hours as needed for mild pain or moderate pain.     allopurinol  (ZYLOPRIM ) 300 MG tablet Take 1 tablet (300 mg total) by mouth daily. 90 tablet 3   amiodarone  (PACERONE ) 200 MG tablet Take 1 tablet (200 mg total) by mouth daily. 30 tablet 11   atorvastatin  (LIPITOR) 80 MG tablet Take 1 tablet (80 mg total) by mouth daily. 90 tablet 3   Cholecalciferol  (VITAMIN D3) 1.25 MG (50000 UT) CAPS Take 1 capsule (1.25 mg total) by mouth once a week. 12 capsule 3   dapagliflozin propanediol (FARXIGA) 10 MG TABS tablet Take 10 mg by mouth daily.     enalapril  (VASOTEC ) 20 MG tablet Take 1 tablet (20 mg total) by mouth daily. 90 tablet 3   furosemide  (LASIX ) 20 MG tablet Take 1 tablet (20 mg total) by mouth daily. 30 tablet 11   gabapentin  (NEURONTIN ) 100 MG capsule Take 1 capsule (100 mg total) by mouth 2 (two) times daily. 180 capsule 3   glucose blood (ACCU-CHEK GUIDE TEST) test strip Use as instructed 100 each 12   levocetirizine (XYZAL ) 5 MG tablet Take 1 tablet (5 mg total) by mouth daily. 90 tablet 3   levothyroxine  (SYNTHROID ) 50 MCG tablet TAKE 1 TABLET EVERY DAY BEFORE BREAKFAST 60  tablet 5   Omega-3 Fatty Acids (OMEGA-3 FISH OIL ) 1200 MG CAPS Take 2 capsules (2,400 mg total) by mouth daily. 180 capsule 3   omeprazole  (PRILOSEC) 20 MG capsule Take 1 capsule (20 mg total) by mouth daily as needed (for heartburn/indigestion). 30 capsule 2   rOPINIRole  (REQUIP ) 1 MG tablet Take 1 tablet (1 mg total) by mouth every evening. 90 tablet 1   RYBELSUS  3 MG TABS Take 1 tablet (3 mg total) by mouth every morning. 30 tablet 6   apixaban  (ELIQUIS ) 2.5 MG TABS tablet Take 1 tablet (2.5 mg total) by mouth 2 (two) times daily. 60 tablet 0   No current facility-administered medications for this visit.    Allergies:   Lisinopril, Metoprolol, Tapentadol , Tramadol, Nsaids, and Simvastatin    Social History:   reports that he has quit smoking. His smoking use included cigarettes. He has  never used smokeless tobacco. He reports that he does not drink alcohol and does not use drugs.   Family History:  family history includes Hypertension in his mother; Pneumonia in his father.    ROS:     Review of Systems  Constitutional: Negative.   HENT: Negative.    Eyes: Negative.   Respiratory: Negative.    Gastrointestinal: Negative.   Genitourinary: Negative.   Musculoskeletal: Negative.   Skin: Negative.   Neurological: Negative.   Endo/Heme/Allergies: Negative.   Psychiatric/Behavioral: Negative.    All other systems reviewed and are negative.     All other systems are reviewed and negative.    PHYSICAL EXAM: VS:  BP (!) 142/60   Pulse (!) 52   Ht 5' 6 (1.676 m)   Wt 221 lb (100.2 kg)   SpO2 96%   BMI 35.67 kg/m  , BMI Body mass index is 35.67 kg/m. Last weight:  Wt Readings from Last 3 Encounters:  09/20/24 221 lb (100.2 kg)  08/30/24 220 lb 6.4 oz (100 kg)  07/26/24 221 lb 3.2 oz (100.3 kg)     Physical Exam Vitals reviewed.  Constitutional:      Appearance: Normal appearance. He is normal weight.  HENT:     Head: Normocephalic.     Nose: Nose normal.     Mouth/Throat:     Mouth: Mucous membranes are moist.  Eyes:     Pupils: Pupils are equal, round, and reactive to light.  Cardiovascular:     Rate and Rhythm: Normal rate and regular rhythm.     Pulses: Normal pulses.     Heart sounds: Normal heart sounds.  Pulmonary:     Effort: Pulmonary effort is normal.  Abdominal:     General: Abdomen is flat. Bowel sounds are normal.  Musculoskeletal:        General: Normal range of motion.     Cervical back: Normal range of motion.  Skin:    General: Skin is warm.  Neurological:     General: No focal deficit present.     Mental Status: He is alert.  Psychiatric:        Mood and Affect: Mood normal.       EKG:   Recent Labs: 06/21/2024: ALT 13; Hemoglobin 13.2; Platelets 338; TSH 1.980 08/30/2024: BUN 31; Creatinine, Ser 1.83; Potassium  4.5; Sodium 142    Lipid Panel    Component Value Date/Time   CHOL 103 06/11/2024 0333   TRIG 75 06/11/2024 0333   HDL 36 (L) 06/11/2024 0333   CHOLHDL 2.9 06/11/2024 0333   VLDL 15  06/11/2024 0333   LDLCALC 52 06/11/2024 0333      Other studies Reviewed: Additional studies/ records that were reviewed today include:  Review of the above records demonstrates:       No data to display            ASSESSMENT AND PLAN:    ICD-10-CM   1. Hypertension associated with diabetes (HCC)  E11.59 amiodarone  (PACERONE ) 200 MG tablet   I15.2 apixaban  (ELIQUIS ) 2.5 MG TABS tablet    2. CHF (congestive heart failure), NYHA class III, acute on chronic, diastolic (HCC)  I50.33 amiodarone  (PACERONE ) 200 MG tablet    apixaban  (ELIQUIS ) 2.5 MG TABS tablet    3. Transient ischemic attack  G45.9 amiodarone  (PACERONE ) 200 MG tablet    apixaban  (ELIQUIS ) 2.5 MG TABS tablet    4. Primary hypertension  I10 amiodarone  (PACERONE ) 200 MG tablet    apixaban  (ELIQUIS ) 2.5 MG TABS tablet    5. Sleep apnea, unspecified type  G47.30 amiodarone  (PACERONE ) 200 MG tablet    apixaban  (ELIQUIS ) 2.5 MG TABS tablet    6. Syncope, unspecified syncope type  R55 amiodarone  (PACERONE ) 200 MG tablet    apixaban  (ELIQUIS ) 2.5 MG TABS tablet   Stress test was normal. LVEF is normal. Advise stopping amlodapine as feels weak and BP is low. Holter atrial fibrilation with RVR 145/min and bradycardia.    7. Combined hyperlipidemia associated with type 2 diabetes mellitus (HCC)  E11.69 amiodarone  (PACERONE ) 200 MG tablet   E78.2 apixaban  (ELIQUIS ) 2.5 MG TABS tablet    8. Atrial fibrillation, currently in sinus rhythm  Z86.79 amiodarone  (PACERONE ) 200 MG tablet    apixaban  (ELIQUIS ) 2.5 MG TABS tablet   New onset paroxysmal atrial fibrilation with RVR 145/mi, and sinus bradycardia. Add eliquis2.5 bid, stop asp and amiodrone.       Problem List Items Addressed This Visit       Cardiovascular and Mediastinum    HTN (hypertension) (Chronic)   Relevant Medications   amiodarone  (PACERONE ) 200 MG tablet   apixaban  (ELIQUIS ) 2.5 MG TABS tablet   Transient ischemic attack   Relevant Medications   amiodarone  (PACERONE ) 200 MG tablet   apixaban  (ELIQUIS ) 2.5 MG TABS tablet   CHF (congestive heart failure), NYHA class III, acute on chronic, diastolic (HCC)   Relevant Medications   amiodarone  (PACERONE ) 200 MG tablet   apixaban  (ELIQUIS ) 2.5 MG TABS tablet     Respiratory   Sleep apnea (Chronic)   Relevant Medications   amiodarone  (PACERONE ) 200 MG tablet   apixaban  (ELIQUIS ) 2.5 MG TABS tablet     Endocrine   Combined hyperlipidemia associated with type 2 diabetes mellitus (HCC)   Relevant Medications   amiodarone  (PACERONE ) 200 MG tablet   apixaban  (ELIQUIS ) 2.5 MG TABS tablet   Other Visit Diagnoses       Hypertension associated with diabetes (HCC)       Relevant Medications   amiodarone  (PACERONE ) 200 MG tablet   apixaban  (ELIQUIS ) 2.5 MG TABS tablet     Syncope, unspecified syncope type       Stress test was normal. LVEF is normal. Advise stopping amlodapine as feels weak and BP is low. Holter atrial fibrilation with RVR 145/min and bradycardia.   Relevant Medications   amiodarone  (PACERONE ) 200 MG tablet   apixaban  (ELIQUIS ) 2.5 MG TABS tablet     Atrial fibrillation, currently in sinus rhythm       New onset paroxysmal atrial fibrilation with RVR 145/mi, and sinus  bradycardia. Add eliquis2.5 bid, stop asp and amiodrone.   Relevant Medications   amiodarone  (PACERONE ) 200 MG tablet   apixaban  (ELIQUIS ) 2.5 MG TABS tablet          Disposition:   No follow-ups on file.    Total time spent: 35 minutes  Signed,  Denyse Bathe, MD  09/20/2024 11:32 AM    [facility] "

## 2024-09-24 ENCOUNTER — Other Ambulatory Visit: Payer: Self-pay | Admitting: Internal Medicine

## 2024-09-24 ENCOUNTER — Other Ambulatory Visit: Payer: Self-pay

## 2024-09-24 DIAGNOSIS — I10 Essential (primary) hypertension: Secondary | ICD-10-CM

## 2024-09-24 DIAGNOSIS — M1000 Idiopathic gout, unspecified site: Secondary | ICD-10-CM

## 2024-09-24 DIAGNOSIS — E1159 Type 2 diabetes mellitus with other circulatory complications: Secondary | ICD-10-CM

## 2024-09-24 DIAGNOSIS — R55 Syncope and collapse: Secondary | ICD-10-CM

## 2024-09-24 DIAGNOSIS — I5033 Acute on chronic diastolic (congestive) heart failure: Secondary | ICD-10-CM

## 2024-09-24 DIAGNOSIS — E119 Type 2 diabetes mellitus without complications: Secondary | ICD-10-CM

## 2024-09-24 DIAGNOSIS — E1169 Type 2 diabetes mellitus with other specified complication: Secondary | ICD-10-CM

## 2024-09-24 DIAGNOSIS — G4730 Sleep apnea, unspecified: Secondary | ICD-10-CM

## 2024-09-24 DIAGNOSIS — G459 Transient cerebral ischemic attack, unspecified: Secondary | ICD-10-CM

## 2024-09-24 DIAGNOSIS — Z8679 Personal history of other diseases of the circulatory system: Secondary | ICD-10-CM

## 2024-09-24 MED ORDER — FUROSEMIDE 20 MG PO TABS: 20.0000 mg | ORAL_TABLET | Freq: Every day | ORAL | 3 refills | Status: AC

## 2024-09-24 MED ORDER — AMIODARONE HCL 200 MG PO TABS: 200.0000 mg | ORAL_TABLET | Freq: Every day | ORAL | 11 refills | Status: AC

## 2024-09-28 ENCOUNTER — Other Ambulatory Visit: Payer: Self-pay

## 2024-09-28 MED ORDER — RIVAROXABAN 20 MG PO TABS: 20.0000 mg | ORAL_TABLET | Freq: Every day | ORAL | 1 refills | Status: AC

## 2024-10-04 ENCOUNTER — Ambulatory Visit: Admitting: Internal Medicine

## 2024-10-18 ENCOUNTER — Ambulatory Visit: Admitting: Internal Medicine

## 2024-11-15 ENCOUNTER — Ambulatory Visit: Admitting: Cardiovascular Disease
